# Patient Record
Sex: Female | Born: 1970 | Race: Black or African American | Hispanic: No | Marital: Married | Smoking: Never smoker
Health system: Southern US, Community
[De-identification: ages and names within clinical notes are randomized; demographics above are authoritative.]

## PROBLEM LIST (undated history)

## (undated) DIAGNOSIS — I720 Aneurysm of carotid artery: Secondary | ICD-10-CM

## (undated) DIAGNOSIS — E7801 Familial hypercholesterolemia: Secondary | ICD-10-CM

## (undated) DIAGNOSIS — R609 Edema, unspecified: Secondary | ICD-10-CM

## (undated) DIAGNOSIS — R519 Headache, unspecified: Secondary | ICD-10-CM

## (undated) DIAGNOSIS — R51 Headache: Secondary | ICD-10-CM

## (undated) DIAGNOSIS — Z9289 Personal history of other medical treatment: Secondary | ICD-10-CM

## (undated) DIAGNOSIS — D649 Anemia, unspecified: Secondary | ICD-10-CM

## (undated) DIAGNOSIS — I2510 Atherosclerotic heart disease of native coronary artery without angina pectoris: Secondary | ICD-10-CM

## (undated) DIAGNOSIS — Z973 Presence of spectacles and contact lenses: Secondary | ICD-10-CM

## (undated) DIAGNOSIS — R7303 Prediabetes: Secondary | ICD-10-CM

## (undated) DIAGNOSIS — I726 Aneurysm of vertebral artery: Secondary | ICD-10-CM

## (undated) DIAGNOSIS — I10 Essential (primary) hypertension: Secondary | ICD-10-CM

## (undated) DIAGNOSIS — G8929 Other chronic pain: Secondary | ICD-10-CM

## (undated) DIAGNOSIS — Z8249 Family history of ischemic heart disease and other diseases of the circulatory system: Secondary | ICD-10-CM

## (undated) DIAGNOSIS — Z90711 Acquired absence of uterus with remaining cervical stump: Secondary | ICD-10-CM

## (undated) HISTORY — DX: Aneurysm of carotid artery: I72.0

## (undated) HISTORY — DX: Edema, unspecified: R60.9

## (undated) HISTORY — DX: Headache: R51

## (undated) HISTORY — PX: ABDOMINAL HYSTERECTOMY: SHX81

## (undated) HISTORY — DX: Acquired absence of uterus with remaining cervical stump: Z90.711

## (undated) HISTORY — DX: Presence of spectacles and contact lenses: Z97.3

## (undated) HISTORY — DX: Aneurysm of vertebral artery: I72.6

## (undated) HISTORY — DX: Familial hypercholesterolemia: E78.01

## (undated) HISTORY — DX: Atherosclerotic heart disease of native coronary artery without angina pectoris: I25.10

## (undated) HISTORY — DX: Family history of ischemic heart disease and other diseases of the circulatory system: Z82.49

## (undated) HISTORY — DX: Prediabetes: R73.03

## (undated) HISTORY — PX: HERNIA REPAIR: SHX51

## (undated) HISTORY — DX: Other chronic pain: G89.29

## (undated) HISTORY — DX: Essential (primary) hypertension: I10

## (undated) HISTORY — DX: Headache, unspecified: R51.9

## (undated) HISTORY — PX: PARTIAL HYSTERECTOMY: SHX80

## (undated) HISTORY — DX: Personal history of other medical treatment: Z92.89

## (undated) HISTORY — PX: TUBAL LIGATION: SHX77

## (undated) HISTORY — DX: Anemia, unspecified: D64.9

---

## 1998-05-07 ENCOUNTER — Ambulatory Visit (HOSPITAL_COMMUNITY): Admission: RE | Admit: 1998-05-07 | Discharge: 1998-05-07 | Payer: Self-pay | Admitting: General Surgery

## 2000-01-22 ENCOUNTER — Encounter: Payer: Self-pay | Admitting: Family Medicine

## 2000-01-22 ENCOUNTER — Encounter: Admission: RE | Admit: 2000-01-22 | Discharge: 2000-01-22 | Payer: Self-pay | Admitting: Family Medicine

## 2001-02-03 ENCOUNTER — Encounter: Payer: Self-pay | Admitting: *Deleted

## 2001-02-03 ENCOUNTER — Emergency Department (HOSPITAL_COMMUNITY): Admission: EM | Admit: 2001-02-03 | Discharge: 2001-02-03 | Payer: Self-pay | Admitting: *Deleted

## 2001-03-22 DIAGNOSIS — Z9289 Personal history of other medical treatment: Secondary | ICD-10-CM

## 2001-03-22 HISTORY — DX: Personal history of other medical treatment: Z92.89

## 2001-11-06 ENCOUNTER — Emergency Department (HOSPITAL_COMMUNITY): Admission: EM | Admit: 2001-11-06 | Discharge: 2001-11-07 | Payer: Self-pay | Admitting: Emergency Medicine

## 2001-11-07 ENCOUNTER — Encounter: Payer: Self-pay | Admitting: *Deleted

## 2002-01-10 ENCOUNTER — Ambulatory Visit (HOSPITAL_COMMUNITY): Admission: RE | Admit: 2002-01-10 | Discharge: 2002-01-10 | Payer: Self-pay | Admitting: Gastroenterology

## 2002-01-10 ENCOUNTER — Encounter: Payer: Self-pay | Admitting: Gastroenterology

## 2002-01-10 ENCOUNTER — Encounter (INDEPENDENT_AMBULATORY_CARE_PROVIDER_SITE_OTHER): Payer: Self-pay | Admitting: *Deleted

## 2002-05-25 ENCOUNTER — Encounter: Admission: RE | Admit: 2002-05-25 | Discharge: 2002-05-25 | Payer: Self-pay | Admitting: Family Medicine

## 2002-05-25 ENCOUNTER — Encounter: Payer: Self-pay | Admitting: Family Medicine

## 2003-11-20 ENCOUNTER — Ambulatory Visit (HOSPITAL_COMMUNITY): Admission: RE | Admit: 2003-11-20 | Discharge: 2003-11-20 | Payer: Self-pay | Admitting: Family Medicine

## 2003-11-20 ENCOUNTER — Emergency Department (HOSPITAL_COMMUNITY): Admission: EM | Admit: 2003-11-20 | Discharge: 2003-11-20 | Payer: Self-pay | Admitting: Family Medicine

## 2003-11-29 ENCOUNTER — Ambulatory Visit (HOSPITAL_COMMUNITY): Admission: RE | Admit: 2003-11-29 | Discharge: 2003-11-29 | Payer: Self-pay | Admitting: Interventional Radiology

## 2003-11-29 ENCOUNTER — Encounter: Admission: RE | Admit: 2003-11-29 | Discharge: 2003-11-29 | Payer: Self-pay | Admitting: Family Medicine

## 2004-01-03 ENCOUNTER — Observation Stay (HOSPITAL_COMMUNITY): Admission: RE | Admit: 2004-01-03 | Discharge: 2004-01-03 | Payer: Self-pay | Admitting: Interventional Radiology

## 2004-01-10 ENCOUNTER — Emergency Department (HOSPITAL_COMMUNITY): Admission: EM | Admit: 2004-01-10 | Discharge: 2004-01-10 | Payer: Self-pay | Admitting: Family Medicine

## 2004-09-23 ENCOUNTER — Ambulatory Visit (HOSPITAL_COMMUNITY): Admission: RE | Admit: 2004-09-23 | Discharge: 2004-09-23 | Payer: Self-pay | Admitting: Anesthesiology

## 2004-12-09 ENCOUNTER — Emergency Department (HOSPITAL_COMMUNITY): Admission: EM | Admit: 2004-12-09 | Discharge: 2004-12-09 | Payer: Self-pay | Admitting: Emergency Medicine

## 2004-12-14 ENCOUNTER — Emergency Department (HOSPITAL_COMMUNITY): Admission: EM | Admit: 2004-12-14 | Discharge: 2004-12-15 | Payer: Self-pay | Admitting: Emergency Medicine

## 2005-01-17 ENCOUNTER — Ambulatory Visit (HOSPITAL_COMMUNITY): Admission: RE | Admit: 2005-01-17 | Discharge: 2005-01-17 | Payer: Self-pay | Admitting: *Deleted

## 2005-05-04 ENCOUNTER — Ambulatory Visit (HOSPITAL_COMMUNITY): Admission: RE | Admit: 2005-05-04 | Discharge: 2005-05-04 | Payer: Self-pay | Admitting: Family Medicine

## 2005-05-04 ENCOUNTER — Emergency Department (HOSPITAL_COMMUNITY): Admission: EM | Admit: 2005-05-04 | Discharge: 2005-05-04 | Payer: Self-pay | Admitting: Family Medicine

## 2005-11-09 ENCOUNTER — Emergency Department (HOSPITAL_COMMUNITY): Admission: EM | Admit: 2005-11-09 | Discharge: 2005-11-09 | Payer: Self-pay | Admitting: Family Medicine

## 2005-12-04 ENCOUNTER — Ambulatory Visit: Payer: Self-pay | Admitting: Internal Medicine

## 2005-12-04 ENCOUNTER — Observation Stay (HOSPITAL_COMMUNITY): Admission: EM | Admit: 2005-12-04 | Discharge: 2005-12-04 | Payer: Self-pay | Admitting: Family Medicine

## 2005-12-08 ENCOUNTER — Ambulatory Visit: Payer: Self-pay

## 2005-12-08 DIAGNOSIS — Z9289 Personal history of other medical treatment: Secondary | ICD-10-CM

## 2005-12-08 HISTORY — DX: Personal history of other medical treatment: Z92.89

## 2006-02-24 HISTORY — PX: OTHER SURGICAL HISTORY: SHX169

## 2006-11-22 ENCOUNTER — Emergency Department (HOSPITAL_COMMUNITY): Admission: EM | Admit: 2006-11-22 | Discharge: 2006-11-22 | Payer: Self-pay | Admitting: Emergency Medicine

## 2006-11-28 ENCOUNTER — Emergency Department (HOSPITAL_COMMUNITY): Admission: EM | Admit: 2006-11-28 | Discharge: 2006-11-28 | Payer: Self-pay | Admitting: Emergency Medicine

## 2007-05-19 ENCOUNTER — Ambulatory Visit (HOSPITAL_COMMUNITY): Admission: RE | Admit: 2007-05-19 | Discharge: 2007-05-19 | Payer: Self-pay | Admitting: Specialist

## 2007-09-07 ENCOUNTER — Emergency Department (HOSPITAL_COMMUNITY): Admission: EM | Admit: 2007-09-07 | Discharge: 2007-09-07 | Payer: Self-pay | Admitting: Family Medicine

## 2007-09-20 ENCOUNTER — Ambulatory Visit: Payer: Self-pay | Admitting: Family Medicine

## 2007-10-11 ENCOUNTER — Ambulatory Visit: Payer: Self-pay | Admitting: Family Medicine

## 2007-10-15 ENCOUNTER — Ambulatory Visit: Payer: Self-pay | Admitting: Family Medicine

## 2008-04-20 ENCOUNTER — Emergency Department (HOSPITAL_COMMUNITY): Admission: EM | Admit: 2008-04-20 | Discharge: 2008-04-20 | Payer: Self-pay | Admitting: Emergency Medicine

## 2008-04-21 ENCOUNTER — Ambulatory Visit: Payer: Self-pay | Admitting: Family Medicine

## 2009-02-19 ENCOUNTER — Ambulatory Visit: Payer: Self-pay | Admitting: Family Medicine

## 2009-02-22 ENCOUNTER — Ambulatory Visit: Payer: Self-pay | Admitting: Family Medicine

## 2009-02-24 HISTORY — PX: ESOPHAGOGASTRODUODENOSCOPY: SHX1529

## 2009-02-24 HISTORY — PX: COLONOSCOPY: SHX174

## 2009-03-16 ENCOUNTER — Ambulatory Visit: Payer: Self-pay | Admitting: Family Medicine

## 2009-03-27 ENCOUNTER — Ambulatory Visit: Payer: Self-pay | Admitting: Family Medicine

## 2009-03-27 ENCOUNTER — Other Ambulatory Visit: Admission: RE | Admit: 2009-03-27 | Discharge: 2009-03-27 | Payer: Self-pay | Admitting: Family Medicine

## 2009-03-28 LAB — HM PAP SMEAR: HM Pap smear: NEGATIVE

## 2009-05-07 ENCOUNTER — Emergency Department (HOSPITAL_COMMUNITY): Admission: EM | Admit: 2009-05-07 | Discharge: 2009-05-07 | Payer: Self-pay | Admitting: Emergency Medicine

## 2009-05-17 ENCOUNTER — Emergency Department (HOSPITAL_COMMUNITY): Admission: EM | Admit: 2009-05-17 | Discharge: 2009-05-17 | Payer: Self-pay | Admitting: Emergency Medicine

## 2009-06-12 ENCOUNTER — Ambulatory Visit: Payer: Self-pay | Admitting: Family Medicine

## 2009-07-22 ENCOUNTER — Emergency Department (HOSPITAL_COMMUNITY): Admission: EM | Admit: 2009-07-22 | Discharge: 2009-07-22 | Payer: Self-pay | Admitting: Emergency Medicine

## 2009-07-27 ENCOUNTER — Ambulatory Visit: Payer: Self-pay | Admitting: Family Medicine

## 2009-07-31 ENCOUNTER — Ambulatory Visit: Payer: Self-pay | Admitting: Family Medicine

## 2009-08-02 ENCOUNTER — Encounter (HOSPITAL_COMMUNITY): Admission: RE | Admit: 2009-08-02 | Discharge: 2009-08-23 | Payer: Self-pay | Admitting: Family Medicine

## 2009-11-29 ENCOUNTER — Ambulatory Visit: Payer: Self-pay | Admitting: Family Medicine

## 2009-12-02 ENCOUNTER — Emergency Department (HOSPITAL_COMMUNITY): Admission: EM | Admit: 2009-12-02 | Discharge: 2009-12-02 | Payer: Self-pay | Admitting: Emergency Medicine

## 2009-12-11 ENCOUNTER — Ambulatory Visit (HOSPITAL_COMMUNITY): Admission: RE | Admit: 2009-12-11 | Discharge: 2009-12-11 | Payer: Self-pay | Admitting: Interventional Radiology

## 2009-12-28 ENCOUNTER — Ambulatory Visit (HOSPITAL_COMMUNITY): Admission: RE | Admit: 2009-12-28 | Discharge: 2009-12-30 | Payer: Self-pay | Admitting: Obstetrics and Gynecology

## 2009-12-28 ENCOUNTER — Encounter (INDEPENDENT_AMBULATORY_CARE_PROVIDER_SITE_OTHER): Payer: Self-pay | Admitting: Obstetrics and Gynecology

## 2010-02-07 ENCOUNTER — Inpatient Hospital Stay (HOSPITAL_COMMUNITY)
Admission: RE | Admit: 2010-02-07 | Discharge: 2010-02-08 | Payer: Self-pay | Source: Home / Self Care | Attending: Interventional Radiology | Admitting: Interventional Radiology

## 2010-02-07 HISTORY — PX: ANEURYSM COILING: SHX5349

## 2010-02-13 ENCOUNTER — Ambulatory Visit: Payer: Self-pay | Admitting: Family Medicine

## 2010-02-26 ENCOUNTER — Ambulatory Visit (HOSPITAL_COMMUNITY): Admission: RE | Admit: 2010-02-26 | Payer: Self-pay | Source: Home / Self Care | Admitting: Interventional Radiology

## 2010-04-29 ENCOUNTER — Emergency Department (HOSPITAL_COMMUNITY): Payer: BC Managed Care – PPO

## 2010-04-29 ENCOUNTER — Emergency Department (HOSPITAL_COMMUNITY)
Admission: EM | Admit: 2010-04-29 | Discharge: 2010-04-29 | Disposition: A | Discharge status: Home / Self Care | Payer: BC Managed Care – PPO | Attending: Emergency Medicine | Admitting: Emergency Medicine

## 2010-04-29 DIAGNOSIS — R079 Chest pain, unspecified: Secondary | ICD-10-CM | POA: Insufficient documentation

## 2010-04-29 DIAGNOSIS — R110 Nausea: Secondary | ICD-10-CM | POA: Insufficient documentation

## 2010-04-29 DIAGNOSIS — E785 Hyperlipidemia, unspecified: Secondary | ICD-10-CM | POA: Insufficient documentation

## 2010-04-29 DIAGNOSIS — R51 Headache: Secondary | ICD-10-CM | POA: Insufficient documentation

## 2010-04-29 DIAGNOSIS — M542 Cervicalgia: Secondary | ICD-10-CM | POA: Insufficient documentation

## 2010-04-29 DIAGNOSIS — E7800 Pure hypercholesterolemia, unspecified: Secondary | ICD-10-CM | POA: Insufficient documentation

## 2010-04-29 LAB — DIFFERENTIAL
Basophils Absolute: 0.1 10*3/uL (ref 0.0–0.1)
Basophils Relative: 1 % (ref 0–1)
Eosinophils Absolute: 0.1 10*3/uL (ref 0.0–0.7)
Eosinophils Relative: 2 % (ref 0–5)
Lymphocytes Relative: 36 % (ref 12–46)
Lymphs Abs: 2.1 10*3/uL (ref 0.7–4.0)
Monocytes Absolute: 0.3 10*3/uL (ref 0.1–1.0)
Monocytes Relative: 5 % (ref 3–12)
Neutro Abs: 3.2 10*3/uL (ref 1.7–7.7)
Neutrophils Relative %: 56 % (ref 43–77)

## 2010-04-29 LAB — POCT I-STAT, CHEM 8
BUN: 8 mg/dL (ref 6–23)
Calcium, Ion: 1.14 mmol/L (ref 1.12–1.32)
Chloride: 106 mEq/L (ref 96–112)
Creatinine, Ser: 0.9 mg/dL (ref 0.4–1.2)
Glucose, Bld: 99 mg/dL (ref 70–99)
HCT: 40 % (ref 36.0–46.0)
Hemoglobin: 13.6 g/dL (ref 12.0–15.0)
Potassium: 3.7 mEq/L (ref 3.5–5.1)
Sodium: 139 mEq/L (ref 135–145)
TCO2: 23 mmol/L (ref 0–100)

## 2010-04-29 LAB — CBC
HCT: 38.4 % (ref 36.0–46.0)
Hemoglobin: 12.3 g/dL (ref 12.0–15.0)
MCH: 26 pg (ref 26.0–34.0)
MCHC: 32 g/dL (ref 30.0–36.0)
MCV: 81.2 fL (ref 78.0–100.0)
Platelets: 252 10*3/uL (ref 150–400)
RBC: 4.73 MIL/uL (ref 3.87–5.11)
RDW: 15.7 % — ABNORMAL HIGH (ref 11.5–15.5)
WBC: 5.8 10*3/uL (ref 4.0–10.5)

## 2010-04-29 LAB — POCT CARDIAC MARKERS
CKMB, poc: <1 ng/mL — ABNORMAL LOW (ref 1.0–8.0)
CKMB, poc: <1 ng/mL — ABNORMAL LOW (ref 1.0–8.0)
Myoglobin, poc: 46.6 ng/mL (ref 12–200)
Myoglobin, poc: 61.4 ng/mL (ref 12–200)
Troponin i, poc: <0.05 ng/mL (ref 0.00–0.09)
Troponin i, poc: <0.05 ng/mL (ref 0.00–0.09)

## 2010-04-29 LAB — PROTIME-INR
INR: 0.94 (ref 0.00–1.49)
Prothrombin Time: 12.8 seconds (ref 11.6–15.2)

## 2010-04-29 MED ORDER — GADOBENATE DIMEGLUMINE 529 MG/ML IV SOLN
15.0000 mL | Freq: Once | INTRAVENOUS | Status: AC
  Administered 2010-04-29: 15 mL via INTRAVENOUS

## 2010-05-03 NOTE — Consult Note (Signed)
NAME:  Debbie [name], Debbie [name]                 ACCOUNT NO.:  [id number]  MEDICAL RECORD NO.:  [id number]           PATIENT TYPE:  E  LOCATION:  [facility]                         FACILITY:  [facility]  PHYSICIAN:  Tia Hieronymus K. Orilla Templeman, M.D.DATE OF BIRTH:  04-01-1970  DATE OF CONSULTATION:  04/29/2010 DATE OF DISCHARGE:                                CONSULTATION   CHIEF COMPLAINT:  Headache.  BRIEF HISTORY:  We were asked to see this pleasant 40 year old female, known to Dr. Corliss Skains secondary to a history of cerebral aneurysms, who presented to the emergency department at [facility] today for further evaluation of headaches.  Initially, the patient had also reported some chest discomfort.  The patient has a history of a right internal carotid artery aneurysm that was treated with a stent-assisted coiling performed by Dr. Corliss Skains on February 07, 2010.  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  The patient presented to the emergency department today with a right- sided headache and chest pain.  A CT scan was negative for a bleed.  The patient stated that the headache started on Friday after a stressful episode at work.  The pain has waxed and waned since that time, but has not resolved completely.  She does have a history of migraine headaches; however, this pain is somewhat different.  The patient has taken Tylenol, but does not want narcotic pain medications.  She reports that her pain is a 6 on 01/10 scale at this time.  On Saturday, it was 8 on 1/10 scale.  She almost went to an urgent care center Saturday; however, the pain seemed to be improving and she decided not to seek treatment.  The patient also reports pain in the right side of her neck, the right mastoid area, and above her right eye.  This pain has been associated with muscle spasms of the neck on the right.  Recently, she has also had muscle spasms on  the left.  She has also had chest tightness at times. There have been no other associated symptoms, although the patient does report that she has had 3 episodes over the past 2 weeks of blurred/double vision.  Each episode lasted only several seconds, but these were very upsetting to the patient.  The patient reports that she has had headaches and chest tightness in the past associated with constipation.  She was constipated on Friday when the headaches began; however, she had several bowel movements over the weekend with no relief in her headache pain.  PAST MEDICAL HISTORY:  The patient has a history of anemia.  She has had previous uterine fibroids.  She had the above-noted endovascular treatment of her cerebral aneurysm.  She has hyperlipidemia and a history of dysphagia.  PAST SURGICAL HISTORY:  Significant for a hysterectomy, 2 hernia repairs, a C-section, tubal ligation, a tubal ligation reversal, and an esophageal dilatation.  ALLERGIES: 1. FLEXERIL. 2. PENICILLIN. 3. SULFA. 4. PAXIL.  CURRENT MEDICATIONS:  The patient is taking aspirin 325 mg daily for her history of stent placement.  SOCIAL HISTORY:  The patient is married.  She and her husband live in [place].  They have 2 children.  She is a nonsmoker.  She does not use alcohol.  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  FAMILY HISTORY:  The patient's mother is alive at age 67.  She has hypertension.  Her father died at age 23 from cardiovascular disease and diabetes.  She has 2 brothers and 1 sister who are alive and well.  LABORATORY DATA:  A head CT showed no intracranial hemorrhage and was essentially normal other than the fact that she had previously undergone stenting and coiling.  There is an MRI pending at this time.  There was an EKG on the chart, which did not show any obvious signs of ischemia, although there was a conduction delay.  A CBC revealed hemoglobin  12.3, hematocrit 38.4, WBCs 5.8 thousand, platelets 252,000.  A PT was within normal range.  Cardiac markers were negative.  An i-STAT 8 was within normal limits with a BUN of 8, creatinine 0.9.  PHYSICAL EXAMINATION:  NEUROLOGIC:  The patient had a normal neurological exam.  She was alert and oriented.  Cranial nerves II-XII were grossly intact.  Sensation was intact to light touch.  Motor strength was 5/5 throughout.  Cerebellar testing was intact. HEART:  Revealed regular rate and rhythm. LUNGS:  Clear.  The situation was discussed with Dr. Corliss Skains.  He was aware of the normal CT scan and of the pending MRI.  He felt that if the MRI was also normal, the patient could be discharged with followup in the near future with Dr. Corliss Skains.  She is currently due for her 57-month angiogram to be performed following her stent-assisted coiling.  We will contact her to have her angiogram performed within the next 1-2 weeks.  The patient might benefit from a muscle relaxant or some anxiety medication.  She does not want to take narcotic pain medications.  She might tolerate nonsteroidal anti-inflammatory drugs.  She needs to continue her aspirin 325 mg daily due to her stent placement.  Provided her MRI is normal, Dr. Corliss Skains recommended proceeding with discharge if the emergency department physicians were in agreement. Further followup will be within the next several weeks with a repeat cerebral angiogram.  The patient was told to contact us if there were any new changes.     Delton See, P.A.   ______________________________ Grandville Silos. Corliss Skains, M.D.    DR/MEDQ  D:  04/29/2010  T:  04/30/2010  Job:  [id number]  cc:   Sharlot Gowda, M.D.  Electronically Signed by Delton See P.A. on 04/30/2010 03:19:16 PM Electronically Signed by Julieanne Cotton M.D. on 05/02/2010 03:41:39 PM

## 2010-05-06 LAB — URINALYSIS, ROUTINE W REFLEX MICROSCOPIC
Bilirubin Urine: NEGATIVE
Glucose, UA: NEGATIVE mg/dL
Hgb urine dipstick: NEGATIVE
Ketones, ur: NEGATIVE mg/dL
Nitrite: NEGATIVE
Protein, ur: NEGATIVE mg/dL
Specific Gravity, Urine: 1.014 (ref 1.005–1.030)
Urobilinogen, UA: 0.2 mg/dL (ref 0.0–1.0)
pH: 7.5 (ref 5.0–8.0)

## 2010-05-06 LAB — DIFFERENTIAL
Basophils Absolute: 0 10*3/uL (ref 0.0–0.1)
Basophils Relative: 1 % (ref 0–1)
Eosinophils Absolute: 0.2 10*3/uL (ref 0.0–0.7)
Eosinophils Relative: 3 % (ref 0–5)
Lymphocytes Relative: 38 % (ref 12–46)
Lymphs Abs: 2.9 10*3/uL (ref 0.7–4.0)
Monocytes Absolute: 0.4 10*3/uL (ref 0.1–1.0)
Monocytes Relative: 6 % (ref 3–12)
Neutro Abs: 4 10*3/uL (ref 1.7–7.7)
Neutrophils Relative %: 53 % (ref 43–77)

## 2010-05-06 LAB — CBC
HCT: 31.7 % — ABNORMAL LOW (ref 36.0–46.0)
HCT: 32.6 % — ABNORMAL LOW (ref 36.0–46.0)
HCT: 39.5 % (ref 36.0–46.0)
Hemoglobin: 10.2 g/dL — ABNORMAL LOW (ref 12.0–15.0)
Hemoglobin: 9.8 g/dL — ABNORMAL LOW (ref 12.0–15.0)
Hemoglobin: 12.5 g/dL (ref 12.0–15.0)
MCH: 23.7 pg — ABNORMAL LOW (ref 26.0–34.0)
MCH: 23.9 pg — ABNORMAL LOW (ref 26.0–34.0)
MCH: 24.3 pg — ABNORMAL LOW (ref 26.0–34.0)
MCHC: 30.9 g/dL (ref 30.0–36.0)
MCHC: 31.3 g/dL (ref 30.0–36.0)
MCHC: 31.6 g/dL (ref 30.0–36.0)
MCV: 76.3 fL — ABNORMAL LOW (ref 78.0–100.0)
MCV: 76.8 fL — ABNORMAL LOW (ref 78.0–100.0)
MCV: 76.8 fL — ABNORMAL LOW (ref 78.0–100.0)
Platelets: 221 10*3/uL (ref 150–400)
Platelets: 242 10*3/uL (ref 150–400)
Platelets: 276 10*3/uL (ref 150–400)
RBC: 4.13 MIL/uL (ref 3.87–5.11)
RBC: 4.27 MIL/uL (ref 3.87–5.11)
RBC: 5.14 MIL/uL — ABNORMAL HIGH (ref 3.87–5.11)
RDW: 19 % — ABNORMAL HIGH (ref 11.5–15.5)
RDW: 19 % — ABNORMAL HIGH (ref 11.5–15.5)
RDW: 18.8 % — ABNORMAL HIGH (ref 11.5–15.5)
WBC: 10 10*3/uL (ref 4.0–10.5)
WBC: 11.7 10*3/uL — ABNORMAL HIGH (ref 4.0–10.5)
WBC: 7.6 10*3/uL (ref 4.0–10.5)

## 2010-05-06 LAB — BASIC METABOLIC PANEL
BUN: 3 mg/dL — ABNORMAL LOW (ref 6–23)
BUN: 7 mg/dL (ref 6–23)
CO2: 22 mEq/L (ref 19–32)
CO2: 23 mEq/L (ref 19–32)
Calcium: 8.2 mg/dL — ABNORMAL LOW (ref 8.4–10.5)
Calcium: 9.6 mg/dL (ref 8.4–10.5)
Chloride: 112 mEq/L (ref 96–112)
Chloride: 106 mEq/L (ref 96–112)
Creatinine, Ser: 0.66 mg/dL (ref 0.4–1.2)
Creatinine, Ser: 0.72 mg/dL (ref 0.4–1.2)
GFR calc Af Amer: >60 mL/min (ref >60)
GFR calc Af Amer: >60 mL/min (ref >60)
GFR calc non Af Amer: >60 mL/min (ref >60)
GFR calc non Af Amer: >60 mL/min (ref >60)
Glucose, Bld: 101 mg/dL — ABNORMAL HIGH (ref 70–99)
Glucose, Bld: 98 mg/dL (ref 70–99)
Potassium: 3.3 mEq/L — ABNORMAL LOW (ref 3.5–5.1)
Potassium: 4.2 mEq/L (ref 3.5–5.1)
Sodium: 139 mEq/L (ref 135–145)
Sodium: 136 mEq/L (ref 135–145)

## 2010-05-06 LAB — APTT
aPTT: 38 seconds — ABNORMAL HIGH (ref 24–37)
aPTT: 50 seconds — ABNORMAL HIGH (ref 24–37)
aPTT: 34 seconds (ref 24–37)

## 2010-05-06 LAB — HEPARIN LEVEL (UNFRACTIONATED): Heparin Unfractionated: <0.1 IU/mL — ABNORMAL LOW (ref 0.30–0.70)

## 2010-05-06 LAB — SURGICAL PCR SCREEN
MRSA, PCR: NEGATIVE
Staphylococcus aureus: NEGATIVE

## 2010-05-06 LAB — PROTIME-INR
INR: 1.06 (ref 0.00–1.49)
INR: 0.96 (ref 0.00–1.49)
Prothrombin Time: 14 seconds (ref 11.6–15.2)
Prothrombin Time: 13 seconds (ref 11.6–15.2)

## 2010-05-07 LAB — CBC
HCT: 20.2 % — ABNORMAL LOW (ref 36.0–46.0)
HCT: 23.1 % — ABNORMAL LOW (ref 36.0–46.0)
HCT: 27.1 % — ABNORMAL LOW (ref 36.0–46.0)
Hemoglobin: 6.4 g/dL — CL (ref 12.0–15.0)
Hemoglobin: 7.2 g/dL — ABNORMAL LOW (ref 12.0–15.0)
Hemoglobin: 8.8 g/dL — ABNORMAL LOW (ref 12.0–15.0)
MCH: 22.5 pg — ABNORMAL LOW (ref 26.0–34.0)
MCH: 22.6 pg — ABNORMAL LOW (ref 26.0–34.0)
MCH: 25 pg — ABNORMAL LOW (ref 26.0–34.0)
MCHC: 31.3 g/dL (ref 30.0–36.0)
MCHC: 31.5 g/dL (ref 30.0–36.0)
MCHC: 32.6 g/dL (ref 30.0–36.0)
MCV: 71.7 fL — ABNORMAL LOW (ref 78.0–100.0)
MCV: 72.1 fL — ABNORMAL LOW (ref 78.0–100.0)
MCV: 76.6 fL — ABNORMAL LOW (ref 78.0–100.0)
Platelets: 332 10*3/uL (ref 150–400)
Platelets: 336 10*3/uL (ref 150–400)
Platelets: 397 10*3/uL (ref 150–400)
RBC: 2.81 MIL/uL — ABNORMAL LOW (ref 3.87–5.11)
RBC: 3.2 MIL/uL — ABNORMAL LOW (ref 3.87–5.11)
RBC: 3.54 MIL/uL — ABNORMAL LOW (ref 3.87–5.11)
RDW: 17.7 % — ABNORMAL HIGH (ref 11.5–15.5)
RDW: 17.7 % — ABNORMAL HIGH (ref 11.5–15.5)
RDW: 20.7 % — ABNORMAL HIGH (ref 11.5–15.5)
WBC: 10.8 10*3/uL — ABNORMAL HIGH (ref 4.0–10.5)
WBC: 11.9 10*3/uL — ABNORMAL HIGH (ref 4.0–10.5)
WBC: 13.8 10*3/uL — ABNORMAL HIGH (ref 4.0–10.5)

## 2010-05-07 LAB — BASIC METABOLIC PANEL
BUN: 7 mg/dL (ref 6–23)
CO2: 25 mEq/L (ref 19–32)
Calcium: 8.5 mg/dL (ref 8.4–10.5)
Chloride: 107 mEq/L (ref 96–112)
Creatinine, Ser: 0.68 mg/dL (ref 0.4–1.2)
GFR calc Af Amer: >60 mL/min (ref >60)
GFR calc non Af Amer: >60 mL/min (ref >60)
Glucose, Bld: 92 mg/dL (ref 70–99)
Potassium: 3.7 mEq/L (ref 3.5–5.1)
Sodium: 139 mEq/L (ref 135–145)

## 2010-05-07 LAB — TYPE AND SCREEN
ABO/RH(D): A POS
Antibody Screen: NEGATIVE
Unit division: 0
Unit division: 0

## 2010-05-07 LAB — PREGNANCY, URINE: Preg Test, Ur: NEGATIVE

## 2010-05-07 LAB — ABO/RH: ABO/RH(D): A POS

## 2010-05-08 LAB — DIFFERENTIAL
Basophils Absolute: 0.1 10*3/uL (ref 0.0–0.1)
Basophils Relative: 1 % (ref 0–1)
Eosinophils Absolute: 0.1 10*3/uL (ref 0.0–0.7)
Eosinophils Relative: 2 % (ref 0–5)
Lymphocytes Relative: 37 % (ref 12–46)
Lymphs Abs: 2.4 10*3/uL (ref 0.7–4.0)
Monocytes Absolute: 0.5 10*3/uL (ref 0.1–1.0)
Monocytes Relative: 7 % (ref 3–12)
Neutro Abs: 3.5 10*3/uL (ref 1.7–7.7)
Neutrophils Relative %: 53 % (ref 43–77)

## 2010-05-08 LAB — CBC
HCT: 30.7 % — ABNORMAL LOW (ref 36.0–46.0)
HCT: 29.9 % — ABNORMAL LOW (ref 36.0–46.0)
Hemoglobin: 9.6 g/dL — ABNORMAL LOW (ref 12.0–15.0)
Hemoglobin: 8.7 g/dL — ABNORMAL LOW (ref 12.0–15.0)
MCH: 22.8 pg — ABNORMAL LOW (ref 26.0–34.0)
MCH: 21.2 pg — ABNORMAL LOW (ref 26.0–34.0)
MCHC: 31.3 g/dL (ref 30.0–36.0)
MCHC: 29.1 g/dL — ABNORMAL LOW (ref 30.0–36.0)
MCV: 72.6 fL — ABNORMAL LOW (ref 78.0–100.0)
MCV: 72.9 fL — ABNORMAL LOW (ref 78.0–100.0)
Platelets: 520 10*3/uL — ABNORMAL HIGH (ref 150–400)
Platelets: 476 10*3/uL — ABNORMAL HIGH (ref 150–400)
RBC: 4.23 MIL/uL (ref 3.87–5.11)
RBC: 4.1 MIL/uL (ref 3.87–5.11)
RDW: 18.4 % — ABNORMAL HIGH (ref 11.5–15.5)
RDW: 16.8 % — ABNORMAL HIGH (ref 11.5–15.5)
WBC: 6.5 10*3/uL (ref 4.0–10.5)
WBC: 6.6 10*3/uL (ref 4.0–10.5)

## 2010-05-08 LAB — URINALYSIS, ROUTINE W REFLEX MICROSCOPIC
Bilirubin Urine: NEGATIVE
Glucose, UA: NEGATIVE mg/dL
Hgb urine dipstick: NEGATIVE
Ketones, ur: NEGATIVE mg/dL
Leukocytes, UA: NEGATIVE
Nitrite: NEGATIVE
Protein, ur: 30 mg/dL — AB
Specific Gravity, Urine: 1.022 (ref 1.005–1.030)
Urobilinogen, UA: 0.2 mg/dL (ref 0.0–1.0)
pH: 7 (ref 5.0–8.0)

## 2010-05-08 LAB — URINE MICROSCOPIC-ADD ON

## 2010-05-08 LAB — POCT I-STAT, CHEM 8
BUN: 10 mg/dL (ref 6–23)
Calcium, Ion: 1.17 mmol/L (ref 1.12–1.32)
Chloride: 105 mEq/L (ref 96–112)
Creatinine, Ser: 1.1 mg/dL (ref 0.4–1.2)
Glucose, Bld: 99 mg/dL (ref 70–99)
HCT: 32 % — ABNORMAL LOW (ref 36.0–46.0)
Hemoglobin: 10.9 g/dL — ABNORMAL LOW (ref 12.0–15.0)
Potassium: 3.7 mEq/L (ref 3.5–5.1)
Sodium: 141 mEq/L (ref 135–145)
TCO2: 25 mmol/L (ref 0–100)

## 2010-05-08 LAB — BASIC METABOLIC PANEL
BUN: 6 mg/dL (ref 6–23)
CO2: 26 mEq/L (ref 19–32)
Calcium: 9.2 mg/dL (ref 8.4–10.5)
Chloride: 100 mEq/L (ref 96–112)
Creatinine, Ser: 0.64 mg/dL (ref 0.4–1.2)
GFR calc Af Amer: >60 mL/min (ref >60)
GFR calc non Af Amer: >60 mL/min (ref >60)
Glucose, Bld: 89 mg/dL (ref 70–99)
Potassium: 3.2 mEq/L — ABNORMAL LOW (ref 3.5–5.1)
Sodium: 134 mEq/L — ABNORMAL LOW (ref 135–145)

## 2010-05-08 LAB — SURGICAL PCR SCREEN
MRSA, PCR: NEGATIVE
Staphylococcus aureus: NEGATIVE

## 2010-05-08 LAB — POCT PREGNANCY, URINE: Preg Test, Ur: NEGATIVE

## 2010-05-13 LAB — RAPID URINE DRUG SCREEN, HOSP PERFORMED
Amphetamines: NOT DETECTED
Barbiturates: NOT DETECTED
Benzodiazepines: NOT DETECTED
Cocaine: NOT DETECTED
Opiates: NOT DETECTED
Tetrahydrocannabinol: NOT DETECTED

## 2010-05-13 LAB — CROSSMATCH
ABO/RH(D): A POS
Antibody Screen: NEGATIVE

## 2010-05-13 LAB — CK: Total CK: 75 U/L (ref 7–177)

## 2010-05-13 LAB — BASIC METABOLIC PANEL
BUN: 6 mg/dL (ref 6–23)
CO2: 24 mEq/L (ref 19–32)
Calcium: 9 mg/dL (ref 8.4–10.5)
Chloride: 109 mEq/L (ref 96–112)
Creatinine, Ser: 0.63 mg/dL (ref 0.4–1.2)
GFR calc Af Amer: >60 mL/min (ref >60)
GFR calc non Af Amer: >60 mL/min (ref >60)
Glucose, Bld: 96 mg/dL (ref 70–99)
Potassium: 3.5 mEq/L (ref 3.5–5.1)
Sodium: 138 mEq/L (ref 135–145)

## 2010-05-13 LAB — ABO/RH: ABO/RH(D): A POS

## 2010-06-09 ENCOUNTER — Inpatient Hospital Stay (INDEPENDENT_AMBULATORY_CARE_PROVIDER_SITE_OTHER)
Admission: RE | Admit: 2010-06-09 | Discharge: 2010-06-09 | Disposition: A | Discharge status: Home / Self Care | Payer: BC Managed Care – PPO | Source: Ambulatory Visit | Attending: Emergency Medicine | Admitting: Emergency Medicine

## 2010-06-09 DIAGNOSIS — L03039 Cellulitis of unspecified toe: Secondary | ICD-10-CM

## 2010-06-09 DIAGNOSIS — M722 Plantar fascial fibromatosis: Secondary | ICD-10-CM

## 2010-07-01 ENCOUNTER — Other Ambulatory Visit: Payer: Self-pay | Admitting: Podiatry

## 2010-07-12 NOTE — Consult Note (Signed)
NAME:  Debbie [name], Debbie [name]                 ACCOUNT NO.:  [id number]   MEDICAL RECORD NO.:  [id number]          PATIENT TYPE:  OBV   LOCATION:  4732                         FACILITY:  [facility]   PHYSICIAN:  Maple Mirza, PA   DATE OF BIRTH:  [birth year]   DATE OF CONSULTATION:  DATE OF DISCHARGE:  12/04/2005                                   CONSULTATION   She was seen by Dr. Gala Romney, who had formulated a plan after examining and  questioning the patient on October 11th.   ALLERGIES:  THIS PATIENT HAS ALLERGIES TO:  1. SULFA.  2. PENICILLIN.  3. FLEXERIL.   PRIMARY CARE PHYSICIAN:  Her primary caregiver is Dr. Loleta Chance and she has an  appointment with him on Saturday.   PRESENTING CIRCUMSTANCE:  I've been waiting all day for my test.   BRIEF HISTORY:  Ms. Debbie [name] is a 40 year old female with known dyslipidemia who  presents to the emergency room with chest pain that started October 9th.  Her chest pain was substernal chest pain, mid chest.  It did not radiate.  She had some nausea.  It was not worse with exertion.  It was first colicky  and intermittent but then became more protracted.  She called EMS on route  to [facility].  She was given nitroglycerin which reduced the pain  somewhat.  Here at the hospital, she has had a cardiac workup.  Her troponin-  I 70s are all less than 0.01 x4.  Her D-dimer was 1.24 in a setting of chest  pain but a computed tomogram of the chest was negative for pulmonary  embolism.  Her electrocardiogram has been surveyed and it is nondiagnostic  for ischemia.  She has sinus rhythm with no ST elevations.  Her complete  blood count showed a hemoglobin of 9.9 and mean cell volume which was below  normal limits, a microcytic anemia.  Her lipid panel was grossly elevated  with a cholesterol of 358, LDL cholesterol of 304, triglycerides of 60 and  HDL cholesterol of 42.   PAST MEDICAL HISTORY:  1. Dyslipidemia.  2. 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  3. Status post C section x2 with entrainment of the uvula during      intubation requiring steroids for reconfiguration.   MEDICATIONS ON ADMISSION:  None.   Today she has been started on Crestor 10 mg and baby aspirin.   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  She does not smoke, take alcoholic beverages or uses  drugs.  Once again she is status post C section x2.  She is married and has  2 children; 1 teenager, 1 pre-teenage.   FAMILY HISTORY:  Father had a heart attack in his 108s and is status post  coronary artery bypass graft surgery with redo coronary surgery.  Mother is  also living, has hypertension.  She has brothers and sisters who are alive  and well.   REVIEW OF SYSTEMS:  CONSTITUTIONAL:  The patient is not  complaining of  fevers, chills, night sweats, weight gain or loss, or adenopathy.  HEENT:  No epistaxis.  No hoarseness.  No vertigo.  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  It  was much more grossly enlarged after intubation but has been somewhat  reconfigured with steroids.  INTEGUMENT:  The patient has no rashes or non-  healing ulcerations.  CARDIOPULMONARY:  The patient had a history of  palpitation thought to be panic attacks under stress.  She was treated for  these with Paxil.  She has had none in the last 2 years.  Patient has chest  pain as related in history of present illness but no dyspnea on exertion.  No orthopnea.  No paroxysmal nocturnal dyspnea.  No lower extremity edema.  No history of syncope.  She says she gets dizzy when hungry or tired.  UROGENITAL:  No urinary problems.  No dysuria.  No frequency.  She has a  menstrual flow that is not heavy.  PSYCHIATRIC:  No anxiety or depression.  GASTROINTESTINAL:  Positive for dysphagia.  In the last few years, the food  feels as if it is stuck midway down.  In  fact, it is sticking around the  side of the chest pain on admission October 9th.  She does not have  regurgitation.  She had EGD with dilatation in the past but this was seen as  no stricture.  She also complains of refluxing and sometimes water brash.  She has no history of GI bleed or peptic ulcer disease.  NEUROLOGIC:  No  deficits.  MUSCULOSKELETAL:  No outstanding arthralgias.  No joint  deformities or effusions.   LABORATORY STUDIES THIS ADMISSION:  Complete blood count is hemoglobin is  9.9, hematocrit 30.2, white cells are 7, platelets are 393,000.   Serum electrolyte:  Sodium 137, potassium 3.6, chloride 106, carbonate 23,  BUN is 7, creatinine 0.7 and glucose is 82, alkaline phosphatase 51, SGOT  15, SGPT is 12, lipase is 27.   Lipids once again:  Cholesterol 358, triglycerides 60, HDL cholesterol 42,  LDL cholesterol 304.  Her D-dimer on admission was 1.24.   PHYSICAL EXAMINATION:  VITAL SIGNS:  Temperature is 97.5.  Blood pressure  130/78.  Pulse is 90.  Respirations are 18.  Oxygen saturation 97% on room  air.  GENERAL:  She is alert and oriented x3.  No acute distress.  Her chest pain  is resolved at the time of this examination.  HEENT:  Eyes:  Pupils are equal, round and reactive to light.  Extraocular  movements are intact.  Sclerae are anicteric.  Nares are patent.  NECK:  Supple.  No jugular venous distention.  No carotid bruits  auscultated.  LUNGS:  Clear to auscultation and percussion bilaterally.  HEART:  Regular rate and rhythm.  Telemetry sinus rhythm.  ABDOMEN:  Soft, nondistended.  Bowel sounds are present.  No  hepatosplenomegaly.  The abdominal aorta is non-pulsatile and non-extensile.  EXTREMITIES:  Show no evidence of edema, clubbing, or cyanosis.  The  dorsalis pedis pulses are 4/4 bilaterally.  Radial pulses are 4/4  bilaterally.  NEUROLOGIC:  She is grossly intact.   IMPRESSION: 1. Admitted with chest pain, 9/10 in severity, better with  nitroglycerin.      No radiation.  No exertional chest pain.  She does have nausea upon      admission.  (A)  Troponin-I studies, all are negative.  (  B)  Electrocardiogram nondiagnostic.  1. Hyperlipidemia, on no medications prior to admission.  2. Microcytic anemia.  Anemia panel has been drawn prior to her discharge.  3. History of dysphagia.  The patient says the food sticks at the same      place as her chest pain.  (A)  History of esophagogastroduodenoscopy dilatation of the esophagus 5  years ago, but report said no stricture.  (B)  Patient must eat very carefully to avoid food sticking in the esophagus  and she describes it about half way down.  1. History of palpation but none in the last 2 years.  2. Enlarged uvula entrained at intubation during C section.  (A)  Size lessened with steroids post C section.  (B)  Medical recommendation is for surgical reconfiguration since the uvula  is still enlarged.  1. A 3.5-mm secular aneurysm, left vertebral artery at the C (2) level.      Dr. Corliss Skains recommended follow up in 6 months.  This study was done      in November 2005 and no other studies have been done to date.   PLAN:  1. Stress test, outpatient, Monday, October 15th at 1 p.m.  We are going      to add a statin, a fairly high dose of Crestor 20 mg.  We are going to      add a proton-pump inhibitor, Protonix 40 mg daily.  Recommend in the      future at some time a modified barium swallow to rule out esophageal      dysmotility and this can be done as an outpatient.  Add aspirin.  Once      again, anemia panels have been drawn here at [facility] prior to      discharge.  Patient will have an iron supplement at discharge and      recommend highly for folic acid over-the-counter.  We also recommend      that she contact Dr. Corliss Skains to re-look into a 4 vessel cerebral      arteriogram as he suggested way back in November of 2005.  We will also      add Zetia to her statin  dose.  As mentioned above, Dr. Gala Romney who      has seen this patient, he doubts that chest pain is cardiac in origin,      possibly GI in origin, but given rather markedly high cholesterol      levels outpatient Myoview is recommended.  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  She was also started on      proton-pump inhibitor.  If pain recurs, GI evaluation to rule out      duodenal ulcer o peptic ulcer disease.   She will discharge on October 11th with the following medications:  1. Crestor 20 mg daily at bedtime.  2. Zetia 10 mg daily.  3. Protonix 40 mg daily.  4. Baby aspirin 81 mg daily.  5. Folic acid 400 mcg 2 tabs daily, over-the-counter.  6. Ferrous sulfate 325 mg, new.  7. Add Citrucel 1 tablespoon/8 ounces with juice or water also over-the-      counter to counteract the iron sulfate.           ______________________________  Maple Mirza, PA    GM/MEDQ  D:  12/04/2005  T:  12/05/2005  Job:  [id number]

## 2010-07-12 NOTE — H&P (Signed)
NAME:  Debbie [name], Debbie [name]                 ACCOUNT NO.:  [id number]   MEDICAL RECORD NO.:  [id number]          PATIENT TYPE:  OBV   LOCATION:  4732                         FACILITY:  [facility]   PHYSICIAN:  Hollice Espy, M.D.DATE OF BIRTH:  08-Aug-1970   DATE OF ADMISSION:  12/03/2005  DATE OF DISCHARGE:                                HISTORY & PHYSICAL   CHIEF COMPLAINT:  Chest pain.   HISTORY OF PRESENT ILLNESS:  The patient is a 40 year old African-American  female with a past medical history of hyperlipidemia, not on any medication,  who presents to the emergency room having chest pressure.  The patient  states the episode started yesterday located in the mid sternal area and  really not radiating, but causing significant nausea with it.  The pain is  described as a pressure, almost a 9 of 10/10 occurring intermittently and  then more frequently.  She finally could not take it any more, became  concerned and called paramedics.  She was given Nitroglycerin en route which  took her pain down from a 10 down to about a 6 or a 5.  She was evaluated in  the emergency room and she was found to have a normal EKG and normal cardiac  enzymes.  She had a slightly elevated d-dimer of 1.24, so she was sent for a  CT scan to rule out pulmonary embolus.  CT was negative for pulmonary  embolus and she is feeling better.  She is, however, noted to have elevated  blood pressure since she has gotten here ranging anywhere from 137 to  157/high 70s to low 80s.  So again cardiac enzymes and EKG were negative.   Currently the patient states she is feeling a little bit better.  She was  seen right before she went for a CT.  She denies any headaches, vision  changes, dysphagia.  She complains of some mild chest pressure, maybe a  2/10.  No shortness of breath.  She complains of some mild nausea.  No  abdominal pain.  No hematuria, dysuria, constipation, diarrhea, focal or  extremity numbness, weakness or pain.   Review of systems otherwise negative.   PAST MEDICAL HISTORY:  Hyperlipidemia.   LABORATORY DATA:  She clearly has a microcytic anemia.   ALLERGIES:  SHE HAS ALLERGIES TO SULFA, PENICILLIN AND FLEXERIL.   SOCIAL HISTORY:  She denies any tobacco, alcohol or drug use.   FAMILY HISTORY:  Noted for a dad who had an MI in his 13s.   PHYSICAL EXAMINATION:  VITALS ON ADMISSION:  Temperature 97.5, heart rate  72, blood pressure initially 157/82.  Since then it has come down to 137/76,  respirations 16.  O2 saturation 100% on 2 liters.  GENERAL:  The patient is alert and oriented x3 in no apparent distress.  HEENT:  Normocephalic, atraumatic.  Mucous membranes are moist.  She has no  carotid bruits.  HEART:  Regular rate and rhythm.  S1 and S2.  LUNGS:  Clear to auscultation bilaterally.  ABDOMEN:  Soft, nontender, nondistended.  Positive bowel sounds.   LABORATORY  DATA:  White count 7, H&H 9.9 and 30.2, MCV of 75, platelet count  393, no shift.  Sodium 137, potassium 3.6, chloride 106, bicarb 23, BUN 7,  creatinine 0.7, glucose 82.  LFT's are unremarkable.  Cardiac markers; first  set 67.5 MB, less than 0.1 troponin, now less than 0.05.  D-dimer is  elevated at 1.24, however, again CT angio was negative for pulmonary  embolus.   ASSESSMENT/PLAN:  1. Chest pain.  This may be musculoskeletal, but certainly do need to rule      out cardiac issue especially with her family history and history of      hyperlipidemia which is non-medication controlled.  We will check two      more sets of cardiac enzymes and a possible stress test in the morning.      We will also put her on a half inch of nitro paste.  We will ask      [facility] for a consult.  2. Abnormal CT findings.  I neglected to mention this on her CT, but she      does have some questionable two areas of possible benign hemangioma      spots seen on her liver.  They recommended CT of the abdomen which we      will check  tomorrow.  3. Hyperlipidemia.  Currently she says that she is diet controlled so we      will check a fasting lipid profile in the morning.  4. Elevated blood pressure.  While she is here put her on nitro paste.      She likely does need to establish with a PCP which will help her too.  5. Microcytic anemia.  Cause is indeterminate.  We will go ahead and check      iron studies.  She has no history of thalassemia.      Hollice Espy, M.D.  Electronically Signed     SKK/MEDQ  D:  12/03/2005  T:  12/04/2005  Job:  [id number]   cc:   [facility]

## 2010-09-09 ENCOUNTER — Other Ambulatory Visit (HOSPITAL_COMMUNITY): Payer: Self-pay | Admitting: Interventional Radiology

## 2010-09-09 DIAGNOSIS — I729 Aneurysm of unspecified site: Secondary | ICD-10-CM

## 2010-09-10 ENCOUNTER — Encounter: Payer: Self-pay | Admitting: Family Medicine

## 2010-09-10 ENCOUNTER — Ambulatory Visit (INDEPENDENT_AMBULATORY_CARE_PROVIDER_SITE_OTHER): Payer: BC Managed Care – PPO | Admitting: Family Medicine

## 2010-09-10 VITALS — BP 120/88 | HR 70 | Wt 154.0 lb

## 2010-09-10 DIAGNOSIS — E785 Hyperlipidemia, unspecified: Secondary | ICD-10-CM

## 2010-09-10 DIAGNOSIS — R51 Headache: Secondary | ICD-10-CM

## 2010-09-10 LAB — LIPID PANEL
Cholesterol: 427 mg/dL — ABNORMAL HIGH (ref 0–200)
HDL: 42 mg/dL (ref >39)
LDL Cholesterol: 365 mg/dL — ABNORMAL HIGH (ref 0–99)
Total CHOL/HDL Ratio: 10.2 Ratio
Triglycerides: 102 mg/dL (ref <150)
VLDL: 20 mg/dL (ref 0–40)

## 2010-09-10 MED ORDER — INDOMETHACIN 50 MG PO CAPS: 50.0000 mg | ORAL_CAPSULE | Freq: Three times a day (TID) | ORAL | Status: DC | PRN

## 2010-09-10 NOTE — Progress Notes (Signed)
  Subjective:    Patient ID: Debbie [name], female    DOB: 03-Oct-1970, 40 y.o.   MRN: [id number]  HPI She has a history of right internal carotid artery aneurysm with coil and stenting. After the procedure she was placed on Plavix and did have 2 episodes of visual disturbance. Since he stopped the Plavix and now only on aspirin she has not had anymore of these. She has been doing well until last week when she developed a right-sided headache that was precipitated a sudden flexion of her neck. It started in the right occipital area and proceeded to go to the right thigh. She has this headache all day but it does get worse when she gets up in the morning. She also complains of left-sided sharp headache for the last several days that is intermittent in nature. Neither of these headaches is associated with blurred vision, double vision, nausea or weakness.   Review of Systems     Objective:   Physical Exam Alert and in no distress. EOMI however convergence does show lack on the right       Assessment & Plan:  Headache, etiology unclear Her situation was discussed with the neurologist on-call. He recommended treating her with Indocin to see if this will help. He essentially indicated that stenting the long run was not necessarily a better way to go. I discussed the possibility refer her to neurology at a later date.

## 2010-09-10 NOTE — Patient Instructions (Signed)
Use the Indocin for the headache. If it helps, continue this and you can cancel the appointment with the radiologist. If not then followup with the radiologist

## 2010-09-11 ENCOUNTER — Other Ambulatory Visit: Payer: Self-pay

## 2010-09-11 ENCOUNTER — Ambulatory Visit (HOSPITAL_COMMUNITY): Payer: BC Managed Care – PPO

## 2010-09-11 ENCOUNTER — Institutional Professional Consult (permissible substitution): Payer: BC Managed Care – PPO | Admitting: Family Medicine

## 2010-09-11 ENCOUNTER — Telehealth: Payer: Self-pay

## 2010-09-11 MED ORDER — ROSUVASTATIN CALCIUM 20 MG PO TABS: 10.0000 mg | ORAL_TABLET | Freq: Every day | ORAL | Status: DC

## 2010-09-11 NOTE — Telephone Encounter (Signed)
Called pt asked about cholesterol med she had been off for over a month called new RX in to [address] pt is comeing to get savings card

## 2010-10-08 ENCOUNTER — Encounter: Payer: Self-pay | Admitting: Family Medicine

## 2010-10-09 ENCOUNTER — Ambulatory Visit (INDEPENDENT_AMBULATORY_CARE_PROVIDER_SITE_OTHER): Payer: BC Managed Care – PPO | Admitting: Family Medicine

## 2010-10-09 ENCOUNTER — Encounter: Payer: Self-pay | Admitting: Family Medicine

## 2010-10-09 VITALS — BP 120/72 | HR 92 | Wt 153.0 lb

## 2010-10-09 DIAGNOSIS — E785 Hyperlipidemia, unspecified: Secondary | ICD-10-CM

## 2010-10-09 DIAGNOSIS — R51 Headache: Secondary | ICD-10-CM

## 2010-10-09 MED ORDER — INDOMETHACIN 50 MG PO CAPS: 50.0000 mg | ORAL_CAPSULE | Freq: Three times a day (TID) | ORAL | Status: DC | PRN

## 2010-10-09 MED ORDER — EZETIMIBE 10 MG PO TABS: 10.0000 mg | ORAL_TABLET | Freq: Every day | ORAL | Status: DC

## 2010-10-09 MED ORDER — ROSUVASTATIN CALCIUM 20 MG PO TABS: 10.0000 mg | ORAL_TABLET | Freq: Every day | ORAL | Status: DC

## 2010-10-10 NOTE — Progress Notes (Signed)
  Subjective:    Patient ID: Debbie [name], female    DOB: February 27, 1970, 40 y.o.   MRN: [id number]  HPI She is here for recheck. Due to subcutaneous medication issues, she has not started on her cholesterol medication. She continues to have daily headaches however these seem to be controlled with the use of on Indocin every day. She would like a refill on this. Apparently the headaches are secondary to her underlying CNS issues. She was told that the headaches could last for longer time.   Review of Systems     Objective:   Physical Exam Alert and in no distress otherwise not examined       Assessment & Plan:  Hyperlipidemia. Chronic headaches. She will start on her Lipitor. I will also renew her Indocin.

## 2010-10-14 ENCOUNTER — Other Ambulatory Visit: Payer: Self-pay

## 2010-10-14 MED ORDER — POLYSACCHARIDE IRON COMPLEX 150 MG PO CAPS: 150.0000 mg | ORAL_CAPSULE | Freq: Two times a day (BID) | ORAL | Status: DC

## 2010-11-21 LAB — DIFFERENTIAL
Basophils Absolute: 0.1
Basophils Relative: 2 — ABNORMAL HIGH
Eosinophils Absolute: 0.1
Eosinophils Relative: 2
Lymphocytes Relative: 40
Lymphs Abs: 2.4
Monocytes Absolute: 0.4
Monocytes Relative: 7
Neutro Abs: 3
Neutrophils Relative %: 49

## 2010-11-21 LAB — CBC
HCT: 37.3
Hemoglobin: 12.4
MCHC: 33.2
MCV: 83
Platelets: 337
RBC: 4.49
RDW: 15.1
WBC: 6.1

## 2010-11-21 LAB — POCT I-STAT, CHEM 8
BUN: 7
Calcium, Ion: 1.23
Chloride: 107
Creatinine, Ser: 0.8
Glucose, Bld: 90
HCT: 40
Hemoglobin: 13.6
Potassium: 4.3
Sodium: 140
TCO2: 26

## 2011-04-22 ENCOUNTER — Encounter (HOSPITAL_COMMUNITY): Payer: Self-pay | Admitting: *Deleted

## 2011-04-22 ENCOUNTER — Other Ambulatory Visit: Payer: Self-pay

## 2011-04-22 ENCOUNTER — Observation Stay (HOSPITAL_COMMUNITY)
Admission: EM | Admit: 2011-04-22 | Discharge: 2011-04-24 | Disposition: A | Discharge status: Home / Self Care | Payer: BC Managed Care – PPO | Source: Ambulatory Visit | Attending: Internal Medicine | Admitting: Internal Medicine

## 2011-04-22 DIAGNOSIS — E785 Hyperlipidemia, unspecified: Secondary | ICD-10-CM | POA: Diagnosis present

## 2011-04-22 DIAGNOSIS — E7800 Pure hypercholesterolemia, unspecified: Secondary | ICD-10-CM | POA: Insufficient documentation

## 2011-04-22 DIAGNOSIS — R7989 Other specified abnormal findings of blood chemistry: Secondary | ICD-10-CM | POA: Diagnosis present

## 2011-04-22 DIAGNOSIS — R079 Chest pain, unspecified: Principal | ICD-10-CM | POA: Diagnosis present

## 2011-04-22 DIAGNOSIS — Z8249 Family history of ischemic heart disease and other diseases of the circulatory system: Secondary | ICD-10-CM | POA: Insufficient documentation

## 2011-04-22 DIAGNOSIS — R0602 Shortness of breath: Secondary | ICD-10-CM | POA: Insufficient documentation

## 2011-04-22 NOTE — ED Notes (Addendum)
C/o 5-6 month h/o intermittant scattered chest & upper back pain, some sob, some light headedness. Chest pain increases with movement and certain positions, also stress ("when something upsets me"), (denies: cough, congestion cold sx, fever or nvd). Alert,NAD, calm, interactive.

## 2011-04-22 NOTE — ED Notes (Signed)
EKG DONE BY EMT R Kailei Cowens 

## 2011-04-23 ENCOUNTER — Emergency Department (HOSPITAL_COMMUNITY): Payer: BC Managed Care – PPO

## 2011-04-23 ENCOUNTER — Observation Stay (HOSPITAL_COMMUNITY): Payer: BC Managed Care – PPO

## 2011-04-23 ENCOUNTER — Encounter (HOSPITAL_COMMUNITY): Payer: Self-pay | Admitting: Internal Medicine

## 2011-04-23 DIAGNOSIS — E785 Hyperlipidemia, unspecified: Secondary | ICD-10-CM | POA: Diagnosis present

## 2011-04-23 DIAGNOSIS — R079 Chest pain, unspecified: Secondary | ICD-10-CM | POA: Diagnosis present

## 2011-04-23 DIAGNOSIS — R7989 Other specified abnormal findings of blood chemistry: Secondary | ICD-10-CM | POA: Diagnosis present

## 2011-04-23 LAB — POCT I-STAT, CHEM 8
BUN: 12 mg/dL (ref 6–23)
Calcium, Ion: 1.27 mmol/L (ref 1.12–1.32)
Chloride: 107 mEq/L (ref 96–112)
Creatinine, Ser: 0.8 mg/dL (ref 0.50–1.10)
Glucose, Bld: 104 mg/dL — ABNORMAL HIGH (ref 70–99)
HCT: 40 % (ref 36.0–46.0)
Hemoglobin: 13.6 g/dL (ref 12.0–15.0)
Potassium: 3.8 mEq/L (ref 3.5–5.1)
Sodium: 143 mEq/L (ref 135–145)
TCO2: 24 mmol/L (ref 0–100)

## 2011-04-23 LAB — CBC
HCT: 38.7 % (ref 36.0–46.0)
Hemoglobin: 13.1 g/dL (ref 12.0–15.0)
MCH: 29.5 pg (ref 26.0–34.0)
MCHC: 33.9 g/dL (ref 30.0–36.0)
MCV: 87.2 fL (ref 78.0–100.0)
Platelets: 257 10*3/uL (ref 150–400)
RBC: 4.44 MIL/uL (ref 3.87–5.11)
RDW: 12.4 % (ref 11.5–15.5)
WBC: 8 10*3/uL (ref 4.0–10.5)

## 2011-04-23 LAB — CARDIAC PANEL(CRET KIN+CKTOT+MB+TROPI)
CK, MB: 1.6 ng/mL (ref 0.3–4.0)
CK, MB: 1.6 ng/mL (ref 0.3–4.0)
CK, MB: 1.4 ng/mL (ref 0.3–4.0)
Relative Index: INVALID (ref 0.0–2.5)
Relative Index: INVALID (ref 0.0–2.5)
Relative Index: INVALID (ref 0.0–2.5)
Total CK: 93 U/L (ref 7–177)
Total CK: 79 U/L (ref 7–177)
Total CK: 65 U/L (ref 7–177)
Troponin I: <0.3 ng/mL (ref <0.30)
Troponin I: <0.3 ng/mL (ref <0.30)
Troponin I: <0.3 ng/mL (ref <0.30)

## 2011-04-23 LAB — LIPID PANEL
Cholesterol: 200 mg/dL (ref 0–200)
HDL: 48 mg/dL (ref >39)
LDL Cholesterol: 141 mg/dL — ABNORMAL HIGH (ref 0–99)
Total CHOL/HDL Ratio: 4.2 RATIO
Triglycerides: 55 mg/dL (ref <150)
VLDL: 11 mg/dL (ref 0–40)

## 2011-04-23 LAB — D-DIMER, QUANTITATIVE: D-Dimer, Quant: 1.18 ug/mL-FEU — ABNORMAL HIGH (ref 0.00–0.48)

## 2011-04-23 LAB — POCT I-STAT TROPONIN I: Troponin i, poc: 0 ng/mL (ref 0.00–0.08)

## 2011-04-23 MED ORDER — ASPIRIN 81 MG PO CHEW
324.0000 mg | CHEWABLE_TABLET | Freq: Once | ORAL | Status: DC
  Filled 2011-04-23: qty 4

## 2011-04-23 MED ORDER — ONDANSETRON HCL 4 MG PO TABS: 4.0000 mg | ORAL_TABLET | Freq: Four times a day (QID) | ORAL | Status: DC | PRN

## 2011-04-23 MED ORDER — ONDANSETRON HCL 4 MG/2ML IJ SOLN
4.0000 mg | Freq: Once | INTRAMUSCULAR | Status: AC
  Administered 2011-04-23: 4 mg via INTRAVENOUS
  Filled 2011-04-23: qty 2

## 2011-04-23 MED ORDER — SODIUM CHLORIDE 0.45 % IV SOLN
INTRAVENOUS | Status: DC
  Administered 2011-04-23 – 2011-04-24 (×3): via INTRAVENOUS

## 2011-04-23 MED ORDER — MORPHINE SULFATE 2 MG/ML IJ SOLN: 2.0000 mg | INTRAMUSCULAR | Status: DC | PRN

## 2011-04-23 MED ORDER — EZETIMIBE 10 MG PO TABS
10.0000 mg | ORAL_TABLET | Freq: Every day | ORAL | Status: DC
  Administered 2011-04-23 – 2011-04-24 (×2): 10 mg via ORAL
  Filled 2011-04-23 (×3): qty 1

## 2011-04-23 MED ORDER — INDOMETHACIN 50 MG PO CAPS
50.0000 mg | ORAL_CAPSULE | Freq: Every day | ORAL | Status: DC
  Administered 2011-04-23 – 2011-04-24 (×2): 50 mg via ORAL
  Filled 2011-04-23 (×2): qty 1

## 2011-04-23 MED ORDER — ONDANSETRON HCL 4 MG/2ML IJ SOLN: 4.0000 mg | Freq: Four times a day (QID) | INTRAMUSCULAR | Status: DC | PRN

## 2011-04-23 MED ORDER — ACETAMINOPHEN 650 MG RE SUPP: 650.0000 mg | Freq: Four times a day (QID) | RECTAL | Status: DC | PRN

## 2011-04-23 MED ORDER — MORPHINE SULFATE 4 MG/ML IJ SOLN
4.0000 mg | Freq: Once | INTRAMUSCULAR | Status: AC
  Administered 2011-04-23: 4 mg via INTRAVENOUS
  Filled 2011-04-23: qty 1

## 2011-04-23 MED ORDER — NITROGLYCERIN 0.4 MG SL SUBL
0.4000 mg | SUBLINGUAL_TABLET | SUBLINGUAL | Status: DC | PRN
  Administered 2011-04-23: 0.4 mg via SUBLINGUAL
  Filled 2011-04-23: qty 25

## 2011-04-23 MED ORDER — ASPIRIN 325 MG PO TABS
325.0000 mg | ORAL_TABLET | Freq: Every day | ORAL | Status: DC
  Administered 2011-04-24: 325 mg via ORAL
  Filled 2011-04-23 (×2): qty 1

## 2011-04-23 MED ORDER — POLYSACCHARIDE IRON 150 MG PO CAPS
150.0000 mg | ORAL_CAPSULE | Freq: Two times a day (BID) | ORAL | Status: DC
  Administered 2011-04-23 – 2011-04-24 (×3): 150 mg via ORAL
  Filled 2011-04-23 (×5): qty 1

## 2011-04-23 MED ORDER — ACETAMINOPHEN 325 MG PO TABS: 650.0000 mg | ORAL_TABLET | Freq: Four times a day (QID) | ORAL | Status: DC | PRN

## 2011-04-23 MED ORDER — ENOXAPARIN SODIUM 80 MG/0.8ML ~~LOC~~ SOLN
70.0000 mg | Freq: Two times a day (BID) | SUBCUTANEOUS | Status: DC
  Administered 2011-04-23: 70 mg via SUBCUTANEOUS
  Filled 2011-04-23 (×3): qty 0.8

## 2011-04-23 MED ORDER — TECHNETIUM TO 99M ALBUMIN AGGREGATED
3.0000 | Freq: Once | INTRAVENOUS | Status: AC | PRN
  Administered 2011-04-23: 3 via INTRAVENOUS

## 2011-04-23 MED ORDER — ATORVASTATIN CALCIUM 10 MG PO TABS
10.0000 mg | ORAL_TABLET | Freq: Every day | ORAL | Status: DC
  Filled 2011-04-23: qty 1

## 2011-04-23 MED ORDER — ENOXAPARIN SODIUM 40 MG/0.4ML ~~LOC~~ SOLN
40.0000 mg | SUBCUTANEOUS | Status: DC
  Administered 2011-04-23: 40 mg via SUBCUTANEOUS
  Filled 2011-04-23 (×2): qty 0.4

## 2011-04-23 NOTE — ED Provider Notes (Signed)
History     CSN: [id number]  Arrival date & time 04/22/11  2243   First MD Initiated Contact with Patient 04/23/11 0142      Chief Complaint  Patient presents with  . Chest Pain  . Shortness of Breath  . Back Pain  . Dizziness    (Consider location/radiation/quality/duration/timing/severity/associated sxs/prior treatment) HPI Left-sided chest pains with shortness of breath. She does get intermittent chest pains, sharp in quality, admits to stress. Symptoms did not last long. Tonight having severe pain and symptoms now associated with difficulty breathing. No trauma. No recent illness. No cough or fevers. No associated leg pain or leg swelling. Patient anxious and does have increased pain with deep breath. No history of DVT or PE. No known cardiac history. Has had a stress test in the past. Has strong family history of coronary artery disease. Moderate in severity. No radiation of pain.   Past Medical History  Diagnosis Date  . Anemia   . Brain aneurysm     BASE OF SKULL  . Dyslipidemia   . Heart disease, unspecified     FAMILY HISTORY  . Hypercholesterolemia     Past Surgical History  Procedure Date  . Hernia repair   . Cesarean section   . Partial hysterectomy   . Aneurysm coiling   . Tubal ligation   . Tubal reversal     Family History  Problem Relation Age of Onset  . Heart failure Sister   . Heart failure Other   . Cancer Other     History  Substance Use Topics  . Smoking status: Never Smoker   . Smokeless tobacco: Never Used  . Alcohol Use: No    OB History    Grav Para Term Preterm Abortions TAB SAB Ect Mult Living                  Review of Systems  Constitutional: Negative for fever and chills.  HENT: Negative for neck pain and neck stiffness.   Eyes: Negative for pain.  Respiratory: Positive for shortness of breath. Negative for cough.   Cardiovascular: Positive for chest pain. Negative for palpitations and leg swelling.  Gastrointestinal:  Negative for vomiting and abdominal pain.  Genitourinary: Negative for dysuria.  Musculoskeletal: Negative for back pain.  Skin: Negative for rash.  Neurological: Negative for headaches.  All other systems reviewed and are negative.    Allergies  Flexeril; Penicillins; and Sulfa antibiotics  Home Medications   Current Outpatient Rx  Name Route Sig Dispense Refill  . ASPIRIN 325 MG PO TABS Oral Take 325 mg by mouth every morning.     Marland Kitchen EZETIMIBE 10 MG PO TABS Oral Take 10 mg by mouth every morning.    . INDOMETHACIN 50 MG PO CAPS Oral Take 50 mg by mouth every morning.    Marland Kitchen POLYSACCHARIDE IRON 150 MG PO CAPS Oral Take 150 mg by mouth 2 (two) times daily.    Marland Kitchen ROSUVASTATIN CALCIUM 20 MG PO TABS Oral Take 10 mg by mouth at bedtime.      BP 128/67  Pulse 89  Temp(Src) 98.8 F (37.1 C) (Oral)  Resp 16  SpO2 99%  Physical Exam  Constitutional: She is oriented to person, place, and time. She appears well-developed and well-nourished.  HENT:  Head: Normocephalic and atraumatic.  Eyes: Conjunctivae and EOM are normal. Pupils are equal, round, and reactive to light.  Neck: Trachea normal. Neck supple. No thyromegaly present.  Cardiovascular: Normal rate, regular rhythm,  S1 normal, S2 normal and normal pulses.     No systolic murmur is present   No diastolic murmur is present  Pulses:      Radial pulses are 2+ on the right side, and 2+ on the left side.  Pulmonary/Chest: Effort normal and breath sounds normal. She has no wheezes. She has no rhonchi. She has no rales. She exhibits no tenderness.       Localizes pain to left anterior chest wall without any reproducible tenderness. No crepitus or rash.  Abdominal: Soft. Normal appearance and bowel sounds are normal. There is no tenderness. There is no CVA tenderness and negative Murphy's sign.  Musculoskeletal:       BLE:s Calves nontender, no cords or erythema, negative Homans sign  Neurological: She is alert and oriented to person,  place, and time. She has normal strength. No cranial nerve deficit or sensory deficit. GCS eye subscore is 4. GCS verbal subscore is 5. GCS motor subscore is 6.  Skin: Skin is warm and dry. No rash noted. She is not diaphoretic.  Psychiatric: Her speech is normal.       Cooperative and appropriate    ED Course  Procedures (including critical care time)  Labs Reviewed  D-DIMER, QUANTITATIVE - Abnormal; Notable for the following:    D-Dimer, Quant 1.18 (*)    All other components within normal limits  POCT I-STAT, CHEM 8 - Abnormal; Notable for the following:    Glucose, Bld 104 (*)    All other components within normal limits  CBC  POCT I-STAT TROPONIN I   Dg Chest Portable 1 View  04/23/2011  *RADIOLOGY REPORT*  Clinical Data: Chest pain and shortness of breath.  PORTABLE CHEST - 1 VIEW  Comparison: 04/29/2010  Findings: Single view of the chest demonstrates clear lungs. Heart and mediastinum are within normal limits.  The trachea is midline. Bony structures are intact.  No evidence for a pneumothorax.  IMPRESSION: No acute chest findings.  Original Report Authenticated By: Richarda Overlie, M.D.     No diagnosis found.   Date: 04/23/2011  Rate: 87  Rhythm: normal sinus rhythm  QRS Axis: normal  Intervals: normal  ST/T Wave abnormalities: nonspecific ST changes  Conduction Disutrbances:none  Narrative Interpretation:   Old EKG Reviewed: none available  Aspirin. Nitroglycerin relieved chest pain. EKG reviewed as above. Patient has elevated d-dimer was sent for CTA and prior to exam patient recalls that she is allergic to IV contrast. Medicine consult for admission and VQ scan in the a.m. case discussed as above with Dr. Mikeal Hawthorne who agrees to admission  MDM   Chest pain elevated d-dimer. Plan admit and VQ scan in a.m. patient pain-free at 4:55 AM        Sunnie Nielsen, MD 04/23/11 [id number]

## 2011-04-23 NOTE — ED Notes (Signed)
Pt stated pain was  A 3/10. When attempting to give the pt another nitro she refused saying her pain was really all gone.

## 2011-04-23 NOTE — ED Notes (Signed)
Pt. Placed on monitor, SpO2, B/P cuff. Changed into a gown. Nurse at bedside.

## 2011-04-23 NOTE — ED Notes (Signed)
Here with c/o Mid sternal chest pain that radiates under L breast.constant pain since 1700.denies diaphoresis or nausea. Pt states she has this chest pain off and on for more than a year but more frequently the last 6 months.Marland Kitchen

## 2011-04-23 NOTE — ED Notes (Signed)
Pt states she had allergic reaction after receiving dye for CT before.  MD notified.

## 2011-04-23 NOTE — Progress Notes (Signed)
   CARE MANAGEMENT NOTE 04/23/2011  Patient:  WILLYE, JAVIER   Account Number:  [id number]  Date Initiated:  04/23/2011  Documentation initiated by:  Donn Pierini  Subjective/Objective Assessment:   Pt admitted with chest pain r/o PE     Action/Plan:   PTA pt lived at home with spouse was independent with ADLs- VQ scan to r/o PE   Anticipated DC Date:  04/24/2011   Anticipated DC Plan:  HOME/SELF CARE      DC Planning Services  CM consult      Choice offered to / List presented to:             Status of service:  In process, will continue to follow Medicare Important Message given?   (If response is "NO", the following Medicare IM given date fields will be blank) Date Medicare IM given:   Date Additional Medicare IM given:    Discharge Disposition:    Per UR Regulation:    Comments:  PCP- [place]  04/23/11- 1700- Donn Pierini RN, BSN [phone] Plan to d/c is VQ and dopplers negative, no anticipated d/c needs. CM to follow

## 2011-04-23 NOTE — Progress Notes (Signed)
Subjective: Denies any chest pain or shortness of breath today  Objective: Vital signs in last 24 hours: Filed Vitals:   04/23/11 0336 04/23/11 0503 04/23/11 0606 04/23/11 0643  BP: 148/84 128/67 146/84 135/81  Pulse: 78 89 79 88  Temp:    98.7 F (37.1 C)  TempSrc:    Oral  Resp: 20 16 18 16   Height:    5\' 4"  (1.626 m)  Weight:    68.493 kg (151 lb)  SpO2: 99% 99% 100% 100%    Intake/Output Summary (Last 24 hours) at 04/23/11 1936 Last data filed at 04/23/11 1902  Gross per 24 hour  Intake 1716.67 ml  Output    400 ml  Net 1316.67 ml    Weight change:  Constitutional: She is oriented to person, place, and time. She appears well-developed and well-nourished.  HENT:  Head: Normocephalic and atraumatic.  Right Ear: External ear normal.  Left Ear: External ear normal.  Nose: Nose normal.  Mouth/Throat: Oropharynx is clear and moist.  Eyes: Conjunctivae and EOM are normal. Pupils are equal, round, and reactive to light.  Neck: Normal range of motion. Neck supple.  Cardiovascular: Normal rate, regular rhythm, normal heart sounds and intact distal pulses.  Respiratory: Effort normal and breath sounds normal.  GI: Soft. Bowel sounds are normal.  Musculoskeletal: Normal range of motion.  Neurological: She is alert and oriented to person, place, and time. She has normal reflexes.  Skin: Skin is warm and dry.  Psychiatric: She has a normal mood and affect. Her behavior is normal. Judgment and thought content normal.     Lab Results: Results for orders placed during the hospital encounter of 04/22/11 (from the past 24 hour(s))  CBC     Status: Normal   Collection Time   04/23/11  1:59 AM      Component Value Range   WBC 8.0  4.0 - 10.5 (K/uL)   RBC 4.44  3.87 - 5.11 (MIL/uL)   Hemoglobin 13.1  12.0 - 15.0 (g/dL)   HCT 82.9  56.2 - 13.0 (%)   MCV 87.2  78.0 - 100.0 (fL)   MCH 29.5  26.0 - 34.0 (pg)   MCHC 33.9  30.0 - 36.0 (g/dL)   RDW 86.5  78.4 - 69.6 (%)   Platelets  257  150 - 400 (K/uL)  D-DIMER, QUANTITATIVE     Status: Abnormal   Collection Time   04/23/11  1:59 AM      Component Value Range   D-Dimer, Quant 1.18 (*) 0.00 - 0.48 (ug/mL-FEU)  POCT I-STAT TROPONIN I     Status: Normal   Collection Time   04/23/11  2:12 AM      Component Value Range   Troponin i, poc 0.00  0.00 - 0.08 (ng/mL)   Comment 3           POCT I-STAT, CHEM 8     Status: Abnormal   Collection Time   04/23/11  2:14 AM      Component Value Range   Sodium 143  135 - 145 (mEq/L)   Potassium 3.8  3.5 - 5.1 (mEq/L)   Chloride 107  96 - 112 (mEq/L)   BUN 12  6 - 23 (mg/dL)   Creatinine, Ser 2.95  0.50 - 1.10 (mg/dL)   Glucose, Bld 284 (*) 70 - 99 (mg/dL)   Calcium, Ion 1.32  4.40 - 1.32 (mmol/L)   TCO2 24  0 - 100 (mmol/L)   Hemoglobin 13.6  12.0 - 15.0 (g/dL)   HCT 16.1  09.6 - 04.5 (%)  CARDIAC PANEL(CRET KIN+CKTOT+MB+TROPI)     Status: Normal   Collection Time   04/23/11  8:50 AM      Component Value Range   Total CK 93  7 - 177 (U/L)   CK, MB 1.6  0.3 - 4.0 (ng/mL)   Troponin I <0.30  <0.30 (ng/mL)   Relative Index RELATIVE INDEX IS INVALID  0.0 - 2.5   LIPID PANEL     Status: Abnormal   Collection Time   04/23/11  8:58 AM      Component Value Range   Cholesterol 200  0 - 200 (mg/dL)   Triglycerides 55  <409 (mg/dL)   HDL 48  >81 (mg/dL)   Total CHOL/HDL Ratio 4.2     VLDL 11  0 - 40 (mg/dL)   LDL Cholesterol 191 (*) 0 - 99 (mg/dL)  CARDIAC PANEL(CRET KIN+CKTOT+MB+TROPI)     Status: Normal   Collection Time   04/23/11  2:45 PM      Component Value Range   Total CK 79  7 - 177 (U/L)   CK, MB 1.6  0.3 - 4.0 (ng/mL)   Troponin I <0.30  <0.30 (ng/mL)   Relative Index RELATIVE INDEX IS INVALID  0.0 - 2.5      Micro: No results found for this or any previous visit (from the past 240 hour(s)).  Studies/Results: Dg Chest 2 View  04/23/2011  *RADIOLOGY REPORT*  Clinical Data: Chest pain.  Question pneumonia.  CHEST - 2 VIEW  Comparison: 04/23/2011  Findings:  Heart and mediastinal contours are within normal limits. No focal opacities or effusions.  No acute bony abnormality.  IMPRESSION: Normal study.  Original Report Authenticated By: Cyndie Chime, M.D.   Nm Pulmonary Perfusion  04/23/2011  *RADIOLOGY REPORT*  Clinical Data:  Cough, short of breath.  NUCLEAR MEDICINE  PERFUSION LUNG SCAN  Technique:  Perfusion images were obtained in multiple projections after intravenous injection of Tc-73m MAA.  Radiopharmaceuticals:   3.0 mCi Tc-50m MAA.  Comparison: Chest x-ray 04/23/2011  Perfusion:  No wedge shaped peripheral perfusion defects to suggest acute pulmonary embolism  IMPRESSION:  Normal perfusion scan.  No evidence of pulmonary embolism.  Original Report Authenticated By: Genevive Bi, M.D.   Dg Chest Portable 1 View  04/23/2011  *RADIOLOGY REPORT*  Clinical Data: Chest pain and shortness of breath.  PORTABLE CHEST - 1 VIEW  Comparison: 04/29/2010  Findings: Single view of the chest demonstrates clear lungs. Heart and mediastinum are within normal limits.  The trachea is midline. Bony structures are intact.  No evidence for a pneumothorax.  IMPRESSION: No acute chest findings.  Original Report Authenticated By: Richarda Overlie, M.D.    Medications:  Scheduled Meds:   . aspirin  324 mg Oral Once  . aspirin  325 mg Oral Daily  . enoxaparin (LOVENOX) injection  70 mg Subcutaneous Q12H  . ezetimibe  10 mg Oral Daily  . indomethacin  50 mg Oral Daily  .  morphine injection  4 mg Intravenous Once  . ondansetron  4 mg Intravenous Once  . polysaccharide iron  150 mg Oral BID  . DISCONTD: atorvastatin  10 mg Oral q1800   Continuous Infusions:   . sodium chloride 100 mL/hr at 04/23/11 1724   PRN Meds:.acetaminophen, acetaminophen, morphine, nitroGLYCERIN, ondansetron (ZOFRAN) IV, ondansetron, technetium albumin aggregated   Assessment:   1. Atypical Chest pain: need to R/O PE and  MI. Will get a V/Q Scan and check cardiac enzymes serially. Empiric  full dose Lovenox until V/Q scan is done and if low probability, will DC that. Give baby ASA. 2-D Doppler is pending because of elevated d-dimer 2. Hyper lipidemia: Check Fasting Lipid panel  3. Back Pain: treat empirically.  4. Dizziness: cause unclear. Seems resolved now.  The DC home in the morning if Doppler is negative       LOS: 1 day   [facility] 04/23/2011, 7:36 PM

## 2011-04-23 NOTE — H&P (Signed)
Debbie [name] is an 41 y.o. female.   Chief Complaint: Chest Pain HPI: 41 yo with no significant past cardiac history but strong family history of cardiac disease here with atypical chest pain symptoms. The chest pain has been there for a while on and off but now persistent in the last day, no radiation, no diaphoresis. Has no NVD, mild SOB but no cough, no orthopnea, no PND. No aggravating factors but relieved by Nitro in the ED. Her initial workup was negative except for elevated D-dimer. Patient could not get Chest CT PE protocol due to Dye allergy.  Past Medical History  Diagnosis Date  . Anemia   . Brain aneurysm     BASE OF SKULL  . Dyslipidemia   . Heart disease, unspecified     FAMILY HISTORY  . Hypercholesterolemia     Past Surgical History  Procedure Date  . Hernia repair   . Cesarean section   . Partial hysterectomy   . Aneurysm coiling   . Tubal ligation   . Tubal reversal     Family History  Problem Relation Age of Onset  . Heart failure Sister   . Heart failure Other   . Cancer Other    Social History:  reports that she has never smoked. She has never used smokeless tobacco. She reports that she does not drink alcohol or use illicit drugs.  Allergies:  Allergies  Allergen Reactions  . Contrast Media (Iodinated Diagnostic Agents)     For CT Angio, breaks out in hives  . Flexeril (Cyclobenzaprine Hcl) Other (See Comments)    Muscle spasms  . Penicillins Other (See Comments)    Unknown reaction many yrs ago  . Sulfa Antibiotics Other (See Comments)    Unknown reaction    Medications Prior to Admission  Medication Dose Route Frequency Provider Last Rate Last Dose  . aspirin chewable tablet 324 mg  324 mg Oral Once Sunnie Nielsen, MD      . morphine 4 MG/ML injection 4 mg  4 mg Intravenous Once Sunnie Nielsen, MD   4 mg at 04/23/11 0224  . nitroGLYCERIN (NITROSTAT) SL tablet 0.4 mg  0.4 mg Sublingual Q5 min PRN Sunnie Nielsen, MD   0.4 mg at 04/23/11 0222  .  ondansetron (ZOFRAN) injection 4 mg  4 mg Intravenous Once Sunnie Nielsen, MD   4 mg at 04/23/11 0224   Medications Prior to Admission  Medication Sig Dispense Refill  . aspirin 325 MG tablet Take 325 mg by mouth every morning.       . ezetimibe (ZETIA) 10 MG tablet Take 10 mg by mouth every morning.      . rosuvastatin (CRESTOR) 20 MG tablet Take 10 mg by mouth at bedtime.        Results for orders placed during the hospital encounter of 04/22/11 (from the past 48 hour(s))  CBC     Status: Normal   Collection Time   04/23/11  1:59 AM      Component Value Range Comment   WBC 8.0  4.0 - 10.5 (K/uL)    RBC 4.44  3.87 - 5.11 (MIL/uL)    Hemoglobin 13.1  12.0 - 15.0 (g/dL)    HCT 16.1  09.6 - 04.5 (%)    MCV 87.2  78.0 - 100.0 (fL)    MCH 29.5  26.0 - 34.0 (pg)    MCHC 33.9  30.0 - 36.0 (g/dL)    RDW 40.9  81.1 - 91.4 (%)  Platelets 257  150 - 400 (K/uL)   D-DIMER, QUANTITATIVE     Status: Abnormal   Collection Time   04/23/11  1:59 AM      Component Value Range Comment   D-Dimer, Quant 1.18 (*) 0.00 - 0.48 (ug/mL-FEU)   POCT I-STAT TROPONIN I     Status: Normal   Collection Time   04/23/11  2:12 AM      Component Value Range Comment   Troponin i, poc 0.00  0.00 - 0.08 (ng/mL)    Comment 3            POCT I-STAT, CHEM 8     Status: Abnormal   Collection Time   04/23/11  2:14 AM      Component Value Range Comment   Sodium 143  135 - 145 (mEq/L)    Potassium 3.8  3.5 - 5.1 (mEq/L)    Chloride 107  96 - 112 (mEq/L)    BUN 12  6 - 23 (mg/dL)    Creatinine, Ser 1.61  0.50 - 1.10 (mg/dL)    Glucose, Bld 096 (*) 70 - 99 (mg/dL)    Calcium, Ion 0.45  1.12 - 1.32 (mmol/L)    TCO2 24  0 - 100 (mmol/L)    Hemoglobin 13.6  12.0 - 15.0 (g/dL)    HCT 40.9  81.1 - 91.4 (%)    Dg Chest Portable 1 View  04/23/2011  *RADIOLOGY REPORT*  Clinical Data: Chest pain and shortness of breath.  PORTABLE CHEST - 1 VIEW  Comparison: 04/29/2010  Findings: Single view of the chest demonstrates clear  lungs. Heart and mediastinum are within normal limits.  The trachea is midline. Bony structures are intact.  No evidence for a pneumothorax.  IMPRESSION: No acute chest findings.  Original Report Authenticated By: Richarda Overlie, M.D.    Review of Systems  Respiratory: Positive for shortness of breath.   Cardiovascular: Positive for chest pain.  All other systems reviewed and are negative.    Blood pressure 146/84, pulse 79, temperature 98.8 F (37.1 C), temperature source Oral, resp. rate 18, SpO2 100.00%. Physical Exam  Constitutional: She is oriented to person, place, and time. She appears well-developed and well-nourished.  HENT:  Head: Normocephalic and atraumatic.  Right Ear: External ear normal.  Left Ear: External ear normal.  Nose: Nose normal.  Mouth/Throat: Oropharynx is clear and moist.  Eyes: Conjunctivae and EOM are normal. Pupils are equal, round, and reactive to light.  Neck: Normal range of motion. Neck supple.  Cardiovascular: Normal rate, regular rhythm, normal heart sounds and intact distal pulses.   Respiratory: Effort normal and breath sounds normal.  GI: Soft. Bowel sounds are normal.  Musculoskeletal: Normal range of motion.  Neurological: She is alert and oriented to person, place, and time. She has normal reflexes.  Skin: Skin is warm and dry.  Psychiatric: She has a normal mood and affect. Her behavior is normal. Judgment and thought content normal.     Assessment/Plan 1. Atypical Chest pain: need to R/O PE and MI. Will get a V/Q Scan and check cardiac enzymes serially. Empiric full dose Lovenox until V/Q scan is done and if low probability, will DC that. Give baby ASA. 2. Hyper lipidemia: Check Fasting Lipid panel 3. Back Pain: treat empirically. 4. Dizziness: cause unclear. Seems resolved now.  Salahuddin Arismendez,LAWAL 04/23/2011, 6:24 AM

## 2011-04-23 NOTE — Progress Notes (Signed)
Utilization review complete 

## 2011-04-23 NOTE — Progress Notes (Signed)
Patient arrived to unit from ED. Alert and oriented. Patient stable. Patient denies having any pain at this time. Vitals and weight taken. Tele monitor on patient. Patient oriented to unit and room. Orders reviewed. Safety video on. Will continue to monitor.

## 2011-04-23 NOTE — Progress Notes (Signed)
ANTICOAGULATION CONSULT NOTE - Initial Consult  Pharmacy Consult for Lovenox Indication: R/O PE  Allergies  Allergen Reactions  . Contrast Media (Iodinated Diagnostic Agents)     For CT Angio, breaks out in hives  . Flexeril (Cyclobenzaprine Hcl) Other (See Comments)    Muscle spasms  . Penicillins Other (See Comments)    Unknown reaction many yrs ago  . Sulfa Antibiotics Other (See Comments)    Unknown reaction    Patient Measurements: Height: 5\' 4"  (162.6 cm) Weight: 151 lb (68.493 kg) (scale C) IBW/kg (Calculated) : 54.7   Vital Signs: Temp: 98.7 F (37.1 C) (02/27 0643) Temp src: Oral (02/27 0643) BP: 135/81 mmHg (02/27 0643) Pulse Rate: 88  (02/27 0643)  Labs:  Basename 04/23/11 0214 04/23/11 0159  HGB 13.6 13.1  HCT 40.0 38.7  PLT -- 257  APTT -- --  LABPROT -- --  INR -- --  HEPARINUNFRC -- --  CREATININE 0.80 --  CKTOTAL -- --  CKMB -- --  TROPONINI -- --   Estimated Creatinine Clearance: 86.2 ml/min (by C-G formula based on Cr of 0.8).  Medical History: Past Medical History  Diagnosis Date  . Anemia   . Brain aneurysm     BASE OF SKULL  . Dyslipidemia   . Heart disease, unspecified     FAMILY HISTORY  . Hypercholesterolemia     Medications:  Prescriptions prior to admission  Medication Sig Dispense Refill  . aspirin 325 MG tablet Take 325 mg by mouth every morning.       . ezetimibe (ZETIA) 10 MG tablet Take 10 mg by mouth every morning.      . indomethacin (INDOCIN) 50 MG capsule Take 50 mg by mouth every morning.      . polysaccharide iron (NIFEREX) 150 MG CAPS capsule Take 150 mg by mouth 2 (two) times daily.      . rosuvastatin (CRESTOR) 20 MG tablet Take 10 mg by mouth at bedtime.        Assessment: 41 yo female with chest pain, poss PE, for Lovenox  Goal of Therapy:  Full anticoagulation with Lovenox   Plan:  Lovenox 70 mg SQ q12h F/U VQ scan results  Rossy Virag, Gary Fleet 04/23/2011,7:19 AM

## 2011-04-24 ENCOUNTER — Ambulatory Visit (HOSPITAL_COMMUNITY): Admission: RE | Admit: 2011-04-24 | Payer: BC Managed Care – PPO | Source: Ambulatory Visit

## 2011-04-24 LAB — COMPREHENSIVE METABOLIC PANEL
ALT: 13 U/L (ref 0–35)
AST: 22 U/L (ref 0–37)
Albumin: 3.2 g/dL — ABNORMAL LOW (ref 3.5–5.2)
Alkaline Phosphatase: 39 U/L (ref 39–117)
BUN: 9 mg/dL (ref 6–23)
CO2: 25 mEq/L (ref 19–32)
Calcium: 8.7 mg/dL (ref 8.4–10.5)
Chloride: 105 mEq/L (ref 96–112)
Creatinine, Ser: 0.7 mg/dL (ref 0.50–1.10)
GFR calc Af Amer: >90 mL/min (ref >90)
GFR calc non Af Amer: >90 mL/min (ref >90)
Glucose, Bld: 100 mg/dL — ABNORMAL HIGH (ref 70–99)
Potassium: 4.3 mEq/L (ref 3.5–5.1)
Sodium: 136 mEq/L (ref 135–145)
Total Bilirubin: 0.7 mg/dL (ref 0.3–1.2)
Total Protein: 6.7 g/dL (ref 6.0–8.3)

## 2011-04-24 LAB — CBC
HCT: 39.3 % (ref 36.0–46.0)
Hemoglobin: 12.9 g/dL (ref 12.0–15.0)
MCH: 29 pg (ref 26.0–34.0)
MCHC: 32.8 g/dL (ref 30.0–36.0)
MCV: 88.3 fL (ref 78.0–100.0)
Platelets: 243 10*3/uL (ref 150–400)
RBC: 4.45 MIL/uL (ref 3.87–5.11)
RDW: 12.5 % (ref 11.5–15.5)
WBC: 6.9 10*3/uL (ref 4.0–10.5)

## 2011-04-24 NOTE — Progress Notes (Signed)
IV d/c'd.  Tele d/c'd.  Pt d/c'd to home.  Home meds and d/c instructions have been discussed with pt.  Pt denies any questions or concerns at this time.  Pt leaving unit via wheelchair and appears in no acute distress. Dustina Scoggin RN 

## 2011-04-24 NOTE — Progress Notes (Deleted)
VASCULAR LAB PRELIMINARY  PRELIMINARY  PRELIMINARY  PRELIMINARY  Bilateral lower extremity venous duplex  completed.    Preliminary report:  Bilateral:  No evidence of DVT, superficial thrombosis, or Baker's Cyst.   Terance Hart, RVT 04/24/2011, 11:53 AM

## 2011-04-24 NOTE — Discharge Summary (Signed)
Physician Discharge Summary  Debbie [name] MRN: [id number] DOB/AGE: August 12, 1970 41 y.o.  PCP: Carollee Herter, MD, MD   Admit date: 04/22/2011 Discharge date: 04/24/2011  Discharge Diagnoses:     *Chest pain    Hyperlipidemia  Elevated d-dimer with negative workup   Medication List  As of 04/24/2011  2:11 PM   TAKE these medications         aspirin 325 MG tablet   Take 325 mg by mouth every morning.      ezetimibe 10 MG tablet   Commonly known as: ZETIA   Take 10 mg by mouth every morning.      indomethacin 50 MG capsule   Commonly known as: INDOCIN   Take 50 mg by mouth every morning.      polysaccharide iron 150 MG Caps capsule   Commonly known as: NIFEREX   Take 150 mg by mouth 2 (two) times daily.      rosuvastatin 20 MG tablet   Commonly known as: CRESTOR   Take 10 mg by mouth at bedtime.            Discharge Condition: Stable Disposition: 01-Home or Self Care   Consults: None   Significant Diagnostic Studies: Dg Chest 2 View  04/23/2011  *RADIOLOGY REPORT*  Clinical Data: Chest pain.  Question pneumonia.  CHEST - 2 VIEW  Comparison: 04/23/2011  Findings: Heart and mediastinal contours are within normal limits. No focal opacities or effusions.  No acute bony abnormality.  IMPRESSION: Normal study.  Original Report Authenticated By: Cyndie Chime, M.D.   Nm Pulmonary Perfusion  04/23/2011  *RADIOLOGY REPORT*  Clinical Data:  Cough, short of breath.  NUCLEAR MEDICINE  PERFUSION LUNG SCAN  Technique:  Perfusion images were obtained in multiple projections after intravenous injection of Tc-23m MAA.  Radiopharmaceuticals:   3.0 mCi Tc-59m MAA.  Comparison: Chest x-ray 04/23/2011  Perfusion:  No wedge shaped peripheral perfusion defects to suggest acute pulmonary embolism  IMPRESSION:  Normal perfusion scan.  No evidence of pulmonary embolism.  Original Report Authenticated By: Genevive Bi, M.D.   Dg Chest Portable 1 View  04/23/2011  *RADIOLOGY  REPORT*  Clinical Data: Chest pain and shortness of breath.  PORTABLE CHEST - 1 VIEW  Comparison: 04/29/2010  Findings: Single view of the chest demonstrates clear lungs. Heart and mediastinum are within normal limits.  The trachea is midline. Bony structures are intact.  No evidence for a pneumothorax.  IMPRESSION: No acute chest findings.  Original Report Authenticated By: Richarda Overlie, M.D.      Microbiology: No results found for this or any previous visit (from the past 240 hour(s)).   Labs: Results for orders placed during the hospital encounter of 04/22/11 (from the past 48 hour(s))  CBC     Status: Normal   Collection Time   04/23/11  1:59 AM      Component Value Range Comment   WBC 8.0  4.0 - 10.5 (K/uL)    RBC 4.44  3.87 - 5.11 (MIL/uL)    Hemoglobin 13.1  12.0 - 15.0 (g/dL)    HCT 40.9  81.1 - 91.4 (%)    MCV 87.2  78.0 - 100.0 (fL)    MCH 29.5  26.0 - 34.0 (pg)    MCHC 33.9  30.0 - 36.0 (g/dL)    RDW 78.2  95.6 - 21.3 (%)    Platelets 257  150 - 400 (K/uL)   D-DIMER, QUANTITATIVE     Status: Abnormal  Collection Time   04/23/11  1:59 AM      Component Value Range Comment   D-Dimer, Quant 1.18 (*) 0.00 - 0.48 (ug/mL-FEU)   POCT I-STAT TROPONIN I     Status: Normal   Collection Time   04/23/11  2:12 AM      Component Value Range Comment   Troponin i, poc 0.00  0.00 - 0.08 (ng/mL)    Comment 3            POCT I-STAT, CHEM 8     Status: Abnormal   Collection Time   04/23/11  2:14 AM      Component Value Range Comment   Sodium 143  135 - 145 (mEq/L)    Potassium 3.8  3.5 - 5.1 (mEq/L)    Chloride 107  96 - 112 (mEq/L)    BUN 12  6 - 23 (mg/dL)    Creatinine, Ser 1.61  0.50 - 1.10 (mg/dL)    Glucose, Bld 096 (*) 70 - 99 (mg/dL)    Calcium, Ion 0.45  1.12 - 1.32 (mmol/L)    TCO2 24  0 - 100 (mmol/L)    Hemoglobin 13.6  12.0 - 15.0 (g/dL)    HCT 40.9  81.1 - 91.4 (%)   CARDIAC PANEL(CRET KIN+CKTOT+MB+TROPI)     Status: Normal   Collection Time   04/23/11  8:50 AM       Component Value Range Comment   Total CK 93  7 - 177 (U/L)    CK, MB 1.6  0.3 - 4.0 (ng/mL)    Troponin I <0.30  <0.30 (ng/mL)    Relative Index RELATIVE INDEX IS INVALID  0.0 - 2.5    LIPID PANEL     Status: Abnormal   Collection Time   04/23/11  8:58 AM      Component Value Range Comment   Cholesterol 200  0 - 200 (mg/dL)    Triglycerides 55  <782 (mg/dL)    HDL 48  >95 (mg/dL)    Total CHOL/HDL Ratio 4.2      VLDL 11  0 - 40 (mg/dL)    LDL Cholesterol 621 (*) 0 - 99 (mg/dL)   CARDIAC PANEL(CRET KIN+CKTOT+MB+TROPI)     Status: Normal   Collection Time   04/23/11  2:45 PM      Component Value Range Comment   Total CK 79  7 - 177 (U/L)    CK, MB 1.6  0.3 - 4.0 (ng/mL)    Troponin I <0.30  <0.30 (ng/mL)    Relative Index RELATIVE INDEX IS INVALID  0.0 - 2.5    CARDIAC PANEL(CRET KIN+CKTOT+MB+TROPI)     Status: Normal   Collection Time   04/23/11 10:58 PM      Component Value Range Comment   Total CK 65  7 - 177 (U/L)    CK, MB 1.4  0.3 - 4.0 (ng/mL)    Troponin I <0.30  <0.30 (ng/mL)    Relative Index RELATIVE INDEX IS INVALID  0.0 - 2.5    CBC     Status: Normal   Collection Time   04/24/11  6:15 AM      Component Value Range Comment   WBC 6.9  4.0 - 10.5 (K/uL)    RBC 4.45  3.87 - 5.11 (MIL/uL)    Hemoglobin 12.9  12.0 - 15.0 (g/dL)    HCT 30.8  65.7 - 84.6 (%)    MCV 88.3  78.0 - 100.0 (fL)  MCH 29.0  26.0 - 34.0 (pg)    MCHC 32.8  30.0 - 36.0 (g/dL)    RDW 03.4  74.2 - 59.5 (%)    Platelets 243  150 - 400 (K/uL)   COMPREHENSIVE METABOLIC PANEL     Status: Abnormal   Collection Time   04/24/11  6:15 AM      Component Value Range Comment   Sodium 136  135 - 145 (mEq/L)    Potassium 4.3  3.5 - 5.1 (mEq/L) HEMOLYSIS AT THIS LEVEL MAY AFFECT RESULT   Chloride 105  96 - 112 (mEq/L)    CO2 25  19 - 32 (mEq/L)    Glucose, Bld 100 (*) 70 - 99 (mg/dL)    BUN 9  6 - 23 (mg/dL)    Creatinine, Ser 6.38  0.50 - 1.10 (mg/dL)    Calcium 8.7  8.4 - 10.5 (mg/dL)    Total Protein  6.7  6.0 - 8.3 (g/dL)    Albumin 3.2 (*) 3.5 - 5.2 (g/dL)    AST 22  0 - 37 (U/L) HEMOLYSIS AT THIS LEVEL MAY AFFECT RESULT   ALT 13  0 - 35 (U/L) HEMOLYSIS AT THIS LEVEL MAY AFFECT RESULT   Alkaline Phosphatase 39  39 - 117 (U/L) HEMOLYSIS AT THIS LEVEL MAY AFFECT RESULT   Total Bilirubin 0.7  0.3 - 1.2 (mg/dL)    GFR calc non Af Amer >90  >90 (mL/min)    GFR calc Af Amer >90  >90 (mL/min)      HPI : 41 yo with no significant past cardiac history but strong family history of cardiac disease here with atypical chest pain symptoms. The chest pain has been there for a while on and off but now persistent in the last day, no radiation, no diaphoresis. Has no NVD, mild SOB but no cough, no orthopnea, no PND. No aggravating factors but relieved by Nitro in the ED. Her initial workup was negative except for elevated D-dimer. Patient could not get Chest CT PE protocol due to Dye allergy.  HOSPITAL COURSE:  #1 chest pain cardiac enzymes were negative V/Q scan that was also negative The patient has been advised  to get an outpatient stress test #2 elevated d-dimer V/Q scan and Doppler of the bilateral lower extremities was negative   Discharge Exam:  Blood pressure 117/74, pulse 68, temperature 98.1 F (36.7 C), temperature source Oral, resp. rate 19, height 5\' 4"  (1.626 m), weight 69.899 kg (154 lb 1.6 oz), SpO2 100.00%.  Constitutional: She is oriented to person, place, and time. She appears well-developed and well-nourished.  HENT:  Head: Normocephalic and atraumatic.  Right Ear: External ear normal.  Left Ear: External ear normal.  Nose: Nose normal.  Mouth/Throat: Oropharynx is clear and moist.  Eyes: Conjunctivae and EOM are normal. Pupils are equal, round, and reactive to light.  Neck: Normal range of motion. Neck supple.  Cardiovascular: Normal rate, regular rhythm, normal heart sounds and intact distal pulses.  Respiratory: Effort normal and breath sounds normal.  GI: Soft. Bowel  sounds are normal.  Musculoskeletal: Normal range of motion.  Neurological: She is alert and oriented to person, place, and time. She has normal reflexes.  Skin: Skin is warm and dry.  Psychiatric: She has a normal mood and affect. Her behavior is normal. Judgment and thought content normal.     Discharge Orders    Future Orders Please Complete By Expires   Diet - low sodium heart healthy  Increase activity slowly         Follow-up Information    Follow up with Carollee Herter, MD .         Signed: Richarda Overlie 04/24/2011, 2:11 PM

## 2011-04-24 NOTE — Progress Notes (Signed)
VASCULAR LAB PRELIMINARY  PRELIMINARY  PRELIMINARY  PRELIMINARY  Bilateral lower extremity venous duplex  completed.    Preliminary report:  Bilateral:  No evidence of DVT, superficial thrombosis, or Baker's Cyst.    Terance Hart, RVT 04/24/2011, 10:33 AM

## 2011-04-24 NOTE — Progress Notes (Signed)
Patient 's PCP is Sharlot Gowda [phone]

## 2011-05-01 ENCOUNTER — Ambulatory Visit (INDEPENDENT_AMBULATORY_CARE_PROVIDER_SITE_OTHER): Payer: BC Managed Care – PPO | Admitting: Family Medicine

## 2011-05-01 ENCOUNTER — Encounter: Payer: Self-pay | Admitting: Family Medicine

## 2011-05-01 VITALS — BP 130/96 | HR 80 | Wt 155.0 lb

## 2011-05-01 DIAGNOSIS — R0789 Other chest pain: Secondary | ICD-10-CM

## 2011-05-01 MED ORDER — INDOMETHACIN 50 MG PO CAPS: 50.0000 mg | ORAL_CAPSULE | Freq: Two times a day (BID) | ORAL | Status: DC

## 2011-05-01 NOTE — Progress Notes (Signed)
  Subjective:    Patient ID: Debbie [name], female    DOB: 02/06/1971, 41 y.o.   MRN: [id number]  HPI She is here for a consult. She was recently seen in the emergency room and admitted for evaluation of chest pain and possible PE. The evaluation was negative. The emergency room record and hospital record was reviewed. She now complains of pain mainly in the left chest area that is worse with taking a deep breath and also laying on her left side. No cough, congestion, sore throat, fever or chills. She does use Indocin to help with her headache that she has had since she had the CNS stent placed.   Review of Systems     Objective:   Physical Exam alert and in no distress. Tympanic membranes and canals are normal. Throat is clear. Tonsils are normal. Neck is supple without adenopathy or thyromegaly. Cardiac exam shows a regular sinus rhythm without murmurs or gallops. Lungs are clear to auscultation. No chest wall tenderness noted. Full motion of the left shoulder without pain.        Assessment & Plan:   1. Musculoskeletal chest pain  indomethacin (INDOCIN) 50 MG capsule   the hospital record was reviewed and they adequately ruled out any major concerns. She is to use the Indocin regularly for the next 7-10 days. Continued difficulty, further evaluation will be necessary.

## 2011-06-03 ENCOUNTER — Ambulatory Visit (INDEPENDENT_AMBULATORY_CARE_PROVIDER_SITE_OTHER): Payer: BC Managed Care – PPO | Admitting: Medical

## 2011-06-03 ENCOUNTER — Encounter: Payer: Self-pay | Admitting: Medical

## 2011-06-03 VITALS — BP 120/70 | HR 76 | Temp 98.4°F | Resp 16 | Ht 65.0 in | Wt 156.0 lb

## 2011-06-03 DIAGNOSIS — I729 Aneurysm of unspecified site: Secondary | ICD-10-CM

## 2011-06-03 DIAGNOSIS — R519 Headache, unspecified: Secondary | ICD-10-CM

## 2011-06-03 DIAGNOSIS — R51 Headache: Secondary | ICD-10-CM

## 2011-06-03 DIAGNOSIS — B351 Tinea unguium: Secondary | ICD-10-CM

## 2011-06-03 DIAGNOSIS — E785 Hyperlipidemia, unspecified: Secondary | ICD-10-CM | POA: Insufficient documentation

## 2011-06-03 DIAGNOSIS — Z0000 Encounter for general adult medical examination without abnormal findings: Secondary | ICD-10-CM

## 2011-06-03 DIAGNOSIS — G8929 Other chronic pain: Secondary | ICD-10-CM

## 2011-06-03 DIAGNOSIS — D649 Anemia, unspecified: Secondary | ICD-10-CM

## 2011-06-03 LAB — POCT URINALYSIS DIPSTICK
Bilirubin, UA: NEGATIVE
Glucose, UA: NEGATIVE
Ketones, UA: NEGATIVE
Leukocytes, UA: NEGATIVE
Nitrite, UA: NEGATIVE
Spec Grav, UA: <=1.005
Urobilinogen, UA: NEGATIVE
pH, UA: 7

## 2011-06-03 MED ORDER — TERBINAFINE HCL 250 MG PO TABS: 250.0000 mg | ORAL_TABLET | Freq: Every day | ORAL | Status: DC

## 2011-06-03 NOTE — Progress Notes (Addendum)
Subjective:   HPI  Debbie [name] is a 41 y.o. female who presents for a complete physical.  She has forms that need completed for foster care as she is foster parent.  She, her husband, and children all have appointments here today for physicals.  She has hx/o aneurysm, one was coiled, and still has another one that is not progressed at this point. She sees interventional radiologist periodically to recheck with arteriogram.  She has never seen neurosurgery, neurology , vascular surgery.  She does report hx/o cardiac evaluation 2004, and was recently seen in the ED for chest pain at [facility].  Preventative care: Last mammogram late 30s Last pap smear 04/2010, but hx/o partial hysterectomy for uterine fibroids, still has ovaries Last ophthalmology about a year ago Last dental visit about 2 years ago Last Tdap? Gets flu shot sometimes  Reviewed their medical, surgical, family, social, medication, and allergy history and updated chart as appropriate.  Past Medical History  Diagnosis Date  . Anemia   . Family history of heart disease in female family member before age 64     LDL over 300 prior  . 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 History of blood transfusion     x2 due to heavy bleeding and anemia from fibroids  . Edema   . Wears glasses   . Chronic headache     since aneurysm diagnosis  . S/P partial hysterectomy     due to uterine fibroids  . Aneurysm of carotid artery     hx/o - coiling 02/07/2010  . Familial hypercholesteremia     LDL over 300; lipoprotein testing 01/2009  . Vitamin d deficiency   . H/O echocardiogram 03/22/01    mild asymmetric LVH, mild aortic sclerosis, mild mitral regurgitation  . H/O cardiovascular stress test 12/08/05    fair exercise capacity, no ST segment changes suggestive of ischemia   Past Surgical History  Procedure Date  . Cesarean section   . Partial hysterectomy   . Tubal  ligation   . Tubal reversal   . Hernia repair     epigastric hernia repair age 49, right inguinal hernia as a child  . Esophagogastroduodenoscopy 2011    Dr. Loreta Ave  . Colonoscopy 2011    Dr. Loreta Ave, anemia workup  . Aneurysm coiling 02/07/10    stent assisted coiling of right internal carotid artery aneurysm in periophthalmic region     Family History  Problem Relation Age of Onset  . Heart failure Sister   . Heart disease Sister 44    stents  . Hypertension Sister   . Heart failure Other   . Cancer Other   . Hypertension Mother   . Obesity Mother   . Heart disease Father 12    hx/o CABG, stening, died of MI at age 43yo  . Diabetes Father   . Hyperlipidemia Father   . Cancer Maternal Grandmother     breast  . Stroke Maternal Grandfather   . Cancer Paternal Grandfather     prostate cancer    History   Social History  . Marital Status: Married    Spouse Name: N/A    Number of Children: N/A  . Years of Education: N/A   Occupational History  . TEACHER, [employer] high school    Social History Main Topics  . Smoking status: Never Smoker   . Smokeless tobacco: Never Used  . Alcohol Use: No  .  Drug Use: No  . Sexually Active: Not on file   Other Topics Concern  . Not on file   Social History Narrative   Married, 4 children, exercise with trainer in 12/2010, walking some    Current Outpatient Prescriptions on File Prior to Visit  Medication Sig Dispense Refill  . aspirin 325 MG tablet Take 325 mg by mouth every morning.       . ezetimibe (ZETIA) 10 MG tablet Take 10 mg by mouth every morning.      . indomethacin (INDOCIN) 50 MG capsule Take 1 capsule (50 mg total) by mouth 2 (two) times daily with a meal.  60 capsule  11  . polysaccharide iron (NIFEREX) 150 MG CAPS capsule Take 150 mg by mouth 2 (two) times daily.      . rosuvastatin (CRESTOR) 20 MG tablet Take 10 mg by mouth at bedtime.        Allergies  Allergen Reactions  . Contrast Media (Iodinated  Diagnostic Agents)     For CT Angio, breaks out in hives  . Flexeril (Cyclobenzaprine Hcl) Other (See Comments)    Muscle spasms  . Lipitor (Atorvastatin Calcium) Other (See Comments)    Pt states she had a bad reaction several years ago when taking this  . Penicillins Other (See Comments)    Unknown reaction many yrs ago  . Sulfa Antibiotics Other (See Comments)    Unknown reaction   Review of Systems Constitutional: -fever, -chills, -sweats, -unexpected weight change, -anorexia, -fatigue Allergy: -sneezing, -itching, -congestion Dermatology: denies changing moles, rash, lumps, new worrisome lesions ENT: -runny nose, -ear pain, +sore throat, -hoarseness, -sinus pain, -teeth pain, -tinnitus, -hearing loss, -epistaxis Cardiology:  -chest pain, -palpitations, -edema, -orthopnea, -paroxysmal nocturnal dyspnea Respiratory: -cough, -shortness of breath, -dyspnea on exertion, -wheezing, -hemoptysis Gastroenterology: -abdominal pain, -nausea, -vomiting, -diarrhea, -constipation, -blood in stool, -changes in bowel movement, -dysphagia Hematology: -bleeding or bruising problems Musculoskeletal: -arthralgias, -myalgias, -joint swelling, -back pain, -neck pain, -cramping, -gait changes Ophthalmology: -vision changes, -eye redness, -itching, -discharge Urology: -dysuria, -difficulty urinating, -hematuria, -urinary frequency, -urgency, incontinence Neurology: +headache, -weakness, -tingling, -numbness, -speech abnormality, -memory loss, -falls, -dizziness Psychology:  -depressed mood, -agitation, -sleep problems      Objective:   Physical Exam  Filed Vitals:   06/03/11 1454  BP: 120/70  Pulse: 76  Temp: 98.4 F (36.9 C)  Resp: 16    General appearance: alert, no distress, WD/WN, black female, pleasant Skin: no worrisome lesions, few toenails with yellowish coloration, right great toenail thickened and yellow HEENT: normocephalic, conjunctiva/corneas normal, sclerae anicteric, PERRLA,  EOMi, nares patent, no discharge or erythema, pharynx normal Oral cavity: MMM, tongue normal, teeth in good repair Neck: supple, no lymphadenopathy, no thyromegaly, no masses, normal ROM, no bruits Chest: non tender, normal shape and expansion Heart: RRR, normal S1, S2, no murmurs Lungs: CTA bilaterally, no wheezes, rhonchi, or rales Abdomen: +bs, soft, upper mid abdomen surgical scar, non tender, non distended, no masses, no hepatomegaly, no splenomegaly, no bruits Back: non tender, normal ROM, no scoliosis Musculoskeletal: upper extremities non tender, no obvious deformity, normal ROM throughout, lower extremities non tender, no obvious deformity, normal ROM throughout Extremities: no edema, no cyanosis, no clubbing Pulses: 2+ symmetric, upper and lower extremities, normal cap refill Neurological: alert, oriented x 3, CN2-12 intact, strength normal upper extremities and lower extremities, sensation normal throughout, DTRs 2+ throughout, no cerebellar signs, gait normal Psychiatric: normal affect, behavior normal, pleasant  Breast/gyn deferred today at pt request.   Assessment and  Plan :    Encounter Diagnoses  Name Primary?  . Routine general medical examination at a health care facility Yes  . Onychomycosis   . Anemia   . Dyslipidemia   . Aneurysm   . Chronic headache     Physical exam - discussed healthy lifestyle, diet, exercise, preventative care, vaccinations, and addressed their concerns.  Handout given.  Onychomycosis - begin Lamisil.  She has used this prior.  recheck 6wk  Anemia - recent CBC 04/24/11 from ED visit normal.  We will get iron studies though since she is current on iron therapy long term.  Dyslipidemia - quite high LDLs in the past.  Will await other labs and consider modification of therapy, goal LDL <100.  Aneurysm - will review prior chart records, and will review case with Dr. Susann Givens  Chronic headache - currently controlled on indocin   Follow-up  pending labs.  Return 6wk for breast and pelvic exam, recheck on Lamisil and scheduling for screening mammogram

## 2011-06-04 ENCOUNTER — Telehealth (HOSPITAL_COMMUNITY): Payer: Self-pay

## 2011-06-04 ENCOUNTER — Encounter: Payer: Self-pay | Admitting: Medical

## 2011-06-04 LAB — IBC PANEL
%SAT: 14 % — ABNORMAL LOW (ref 20–55)
TIBC: 372 ug/dL (ref 250–470)
UIBC: 320 ug/dL (ref 125–400)

## 2011-06-04 LAB — FERRITIN: Ferritin: 58 ng/mL (ref 10–291)

## 2011-06-04 LAB — TSH: TSH: 0.893 u[IU]/mL (ref 0.350–4.500)

## 2011-06-04 LAB — IRON: Iron: 52 ug/dL (ref 42–145)

## 2011-06-04 NOTE — Telephone Encounter (Signed)
Left a message for the pt to call in order to schedule a F/U consult with Dr. Corliss Skains  06-04-11 11:10 JM

## 2011-06-09 ENCOUNTER — Other Ambulatory Visit: Payer: Self-pay | Admitting: Medical

## 2011-06-09 MED ORDER — NIACIN ER (ANTIHYPERLIPIDEMIC) 500 MG PO TBCR: 500.0000 mg | EXTENDED_RELEASE_TABLET | Freq: Every day | ORAL | Status: DC

## 2011-06-10 ENCOUNTER — Telehealth (HOSPITAL_COMMUNITY): Payer: Self-pay

## 2011-06-10 NOTE — Telephone Encounter (Signed)
Left a vm for the pt to give Dr. Fatima Sanger office a call

## 2011-10-12 ENCOUNTER — Other Ambulatory Visit: Payer: Self-pay | Admitting: Family Medicine

## 2011-12-16 ENCOUNTER — Ambulatory Visit (INDEPENDENT_AMBULATORY_CARE_PROVIDER_SITE_OTHER): Payer: BC Managed Care – PPO | Admitting: Family Medicine

## 2011-12-16 ENCOUNTER — Encounter: Payer: Self-pay | Admitting: Family Medicine

## 2011-12-16 VITALS — BP 136/88 | HR 70 | Wt 154.0 lb

## 2011-12-16 DIAGNOSIS — D649 Anemia, unspecified: Secondary | ICD-10-CM

## 2011-12-16 DIAGNOSIS — H6120 Impacted cerumen, unspecified ear: Secondary | ICD-10-CM

## 2011-12-16 DIAGNOSIS — R42 Dizziness and giddiness: Secondary | ICD-10-CM

## 2011-12-16 LAB — CBC WITH DIFFERENTIAL/PLATELET
Basophils Absolute: 0 10*3/uL (ref 0.0–0.1)
Basophils Relative: 1 % (ref 0–1)
Eosinophils Absolute: 0.2 10*3/uL (ref 0.0–0.7)
Eosinophils Relative: 3 % (ref 0–5)
HCT: 38.6 % (ref 36.0–46.0)
Hemoglobin: 12.6 g/dL (ref 12.0–15.0)
Lymphocytes Relative: 43 % (ref 12–46)
Lymphs Abs: 2.8 10*3/uL (ref 0.7–4.0)
MCH: 29.5 pg (ref 26.0–34.0)
MCHC: 32.6 g/dL (ref 30.0–36.0)
MCV: 90.4 fL (ref 78.0–100.0)
Monocytes Absolute: 0.4 10*3/uL (ref 0.1–1.0)
Monocytes Relative: 6 % (ref 3–12)
Neutro Abs: 3 10*3/uL (ref 1.7–7.7)
Neutrophils Relative %: 47 % (ref 43–77)
Platelets: 261 10*3/uL (ref 150–400)
RBC: 4.27 MIL/uL (ref 3.87–5.11)
RDW: 13.1 % (ref 11.5–15.5)
WBC: 6.3 10*3/uL (ref 4.0–10.5)

## 2011-12-16 MED ORDER — MECLIZINE HCL 12.5 MG PO TABS: 12.5000 mg | ORAL_TABLET | Freq: Three times a day (TID) | ORAL | Status: DC | PRN

## 2011-12-16 NOTE — Progress Notes (Signed)
  Subjective:    Patient ID: Debbie [name], female    DOB: [birth year], 41 y.o.   MRN: [id number]  HPI She has a three-week history of intermittent left earache, dizziness, blurred vision and today she noted slight sore throat on the left. No fever, chills, cough or congestion. Also of note is the fact she stopped taking iron due to constipation. She also had a hysterectomy to treat her cause of the anemia.   Review of Systems     Objective:   Physical Exam alert and in no distress. Tympanic membranes and canals are normal after cerumen was removed from the left canal. Throat is clear. Tonsils are normal. Neck is supple without adenopathy or thyromegaly. Cardiac exam shows a regular sinus rhythm without murmurs or gallops. Lungs are clear to auscultation.       Assessment & Plan:   1. Vertigo  meclizine (ANTIVERT) 12.5 MG tablet  2. Anemia  CBC with Differential   if continued difficulty with vertigo, she is to return here for followup.

## 2011-12-16 NOTE — Patient Instructions (Signed)
Take the dizziness medication and if you continue to have trouble after a week, return here.

## 2011-12-17 ENCOUNTER — Ambulatory Visit: Payer: BC Managed Care – PPO | Admitting: Family Medicine

## 2011-12-17 NOTE — Progress Notes (Signed)
Quick Note:  Left message for pt to call back ______ 

## 2011-12-18 NOTE — Progress Notes (Signed)
Quick Note:  Left message for pt on cell about labs ______

## 2012-07-09 ENCOUNTER — Ambulatory Visit (INDEPENDENT_AMBULATORY_CARE_PROVIDER_SITE_OTHER): Payer: BC Managed Care – PPO | Admitting: Medical

## 2012-07-09 ENCOUNTER — Encounter: Payer: Self-pay | Admitting: Medical

## 2012-07-09 VITALS — BP 130/80 | HR 88 | Temp 98.6°F | Resp 16 | Wt 149.0 lb

## 2012-07-09 DIAGNOSIS — R202 Paresthesia of skin: Secondary | ICD-10-CM

## 2012-07-09 DIAGNOSIS — R42 Dizziness and giddiness: Secondary | ICD-10-CM

## 2012-07-09 DIAGNOSIS — R209 Unspecified disturbances of skin sensation: Secondary | ICD-10-CM

## 2012-07-09 DIAGNOSIS — Z8679 Personal history of other diseases of the circulatory system: Secondary | ICD-10-CM

## 2012-07-09 NOTE — Progress Notes (Signed)
Subjective: Here for not feeling right.  Started earlier in the week with arms hurting in both arms around medial elbows.  This was just enough to know she didn't feel right.   Few days latera had pressure in back of head this week after drinking some wine.   Doesn't normally drink wine, but daughter bought some and she decided to drink some.   Lasted with heavy pressure for 30 minutes, but after this subsided, vision seemed funny.  Has felt a little off balance.  Was fine the next morning, but had similar symptoms for the next few days.  Fingertips went numb yesterday, left thumb index and middle finger.  No numbness of right hand.  She does note some recent stress, less sleep than usual too.  Not sure if this is all related to stress or other, but with her hx/o aneurysm, wanted to come and check thinks out.   Never went for recheck with Dr. Corliss Skains (interventional radiology) last year since they wanted to start again with brain angiogram.  has had some sharp brief pains in left eye the last few weeks.  Denies chest pain, dyspnea, no other numbness, tingling, or weakness.  No fall, no LOC, no other headaches.  Denies any other symptoms.   Past Medical History  Diagnosis Date  . Anemia   . Family history of heart disease in female family member before age 59     LDL over 300 prior  . 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 History of blood transfusion     x2 due to heavy bleeding and anemia from fibroids  . Edema   . Wears glasses   . Chronic headache     since aneurysm diagnosis  . S/P partial hysterectomy     due to uterine fibroids  . Aneurysm of carotid artery     hx/o - coiling 02/07/2010  . Familial hypercholesteremia     LDL over 300; lipoprotein testing 01/2009  . Vitamin D deficiency   . H/O echocardiogram 03/22/01    mild asymmetric LVH, mild aortic sclerosis, mild mitral regurgitation  . H/O cardiovascular stress test 12/08/05     fair exercise capacity, no ST segment changes suggestive of ischemia   ROS as in subjective   Objective:   Physical Exam  Filed Vitals:   07/09/12 1342  BP: 130/80  Pulse: 88  Temp: 98.6 F (37 C)  Resp: 16    General appearance: alert, no distress, WD/WN HEENT: normocephalic, sclerae anicteric, PERRLA, EOMi, nares patent, no discharge or erythema, pharynx normal Oral cavity: MMM, no lesions Neck: supple, no lymphadenopathy, no thyromegaly, no masses, no bruits or JVD Heart: RRR, normal S1, S2, no murmurs Lungs: CTA bilaterally, no wheezes, rhonchi, or rales Musculoskeletal: nontender, no swelling, no obvious deformity Extremities: no edema, no cyanosis, no clubbing Pulses: 2+ symmetric, upper and lower extremities, normal cap refill Neurological: alert, oriented x 3, CN2-12 intact, strength normal upper extremities and lower extremities, sensation normal throughout, DTRs 2+ throughout, slightly off balance with heel to toe, otherwise finger to nose, romberg all normal, gait normal Psychiatric: normal affect, behavior normal, pleasant    Assessment and Plan :    Encounter Diagnoses  Name Primary?  . Disequilibrium Yes  . Paresthesia   . History of aneurysm    Reassured that currently exam findings seem ok.  Discussed her concerns, possible causes including both benign issues as well as possible relationship  to her brain aneurysm.   Discussed IR followup, discussed imaging, discussed watch and wait approach to symptoms.   She has informed her daughter that she hasn't felt her best this week.  Discussed keeping her daughter informed and having daughter check on her this weekend, and if any changes in affect, speech, mentation, go to the ED.  Patient Instructions  Currently your exam findings are normal.  Over the next 24-48 hours if you have any of the following symptoms, call 911 or go to the emergency department:  Vision loss, worsening vision changes  Numbness,  tingling or weakness  Worsening or severe headaches  Headaches with nausea and vomiting  Worse off balance  Chest pain  Otherwise, consider meclizine for vertigo symptoms.  You can use Tylenol for headache  If no worse and improving, please call Monday to give me an update on your symptoms.  Get back in with consult with Dr. Corliss Skains

## 2012-07-09 NOTE — Patient Instructions (Signed)
Currently your exam findings are normal.  Over the next 24-48 hours if you have any of the following symptoms, call 911 or go to the emergency department:  Vision loss, worsening vision changes  Numbness, tingling or weakness  Worsening or severe headaches  Headaches with nausea and vomiting  Worse off balance  Chest pain  Otherwise, consider meclizine for vertigo symptoms.  You can use Tylenol for headache  If no worse and improving, please call Monday to give me an update on your symptoms.  Get back in with consult with Dr. Corliss Skains

## 2012-07-13 ENCOUNTER — Emergency Department (HOSPITAL_COMMUNITY)
Admission: EM | Admit: 2012-07-13 | Discharge: 2012-07-14 | Disposition: A | Discharge status: Home / Self Care | Payer: BC Managed Care – PPO | Attending: Emergency Medicine | Admitting: Emergency Medicine

## 2012-07-13 ENCOUNTER — Encounter (HOSPITAL_COMMUNITY): Payer: Self-pay | Admitting: *Deleted

## 2012-07-13 ENCOUNTER — Emergency Department (HOSPITAL_COMMUNITY): Payer: BC Managed Care – PPO

## 2012-07-13 ENCOUNTER — Telehealth: Payer: Self-pay | Admitting: Family Medicine

## 2012-07-13 DIAGNOSIS — R42 Dizziness and giddiness: Secondary | ICD-10-CM

## 2012-07-13 DIAGNOSIS — Z7982 Long term (current) use of aspirin: Secondary | ICD-10-CM | POA: Insufficient documentation

## 2012-07-13 DIAGNOSIS — Z8679 Personal history of other diseases of the circulatory system: Secondary | ICD-10-CM | POA: Insufficient documentation

## 2012-07-13 DIAGNOSIS — Z862 Personal history of diseases of the blood and blood-forming organs and certain disorders involving the immune mechanism: Secondary | ICD-10-CM | POA: Insufficient documentation

## 2012-07-13 DIAGNOSIS — M791 Myalgia, unspecified site: Secondary | ICD-10-CM

## 2012-07-13 DIAGNOSIS — Z8639 Personal history of other endocrine, nutritional and metabolic disease: Secondary | ICD-10-CM | POA: Insufficient documentation

## 2012-07-13 DIAGNOSIS — IMO0001 Reserved for inherently not codable concepts without codable children: Secondary | ICD-10-CM | POA: Insufficient documentation

## 2012-07-13 DIAGNOSIS — Z79899 Other long term (current) drug therapy: Secondary | ICD-10-CM | POA: Insufficient documentation

## 2012-07-13 DIAGNOSIS — E7800 Pure hypercholesterolemia, unspecified: Secondary | ICD-10-CM | POA: Insufficient documentation

## 2012-07-13 DIAGNOSIS — R0789 Other chest pain: Secondary | ICD-10-CM

## 2012-07-13 LAB — CBC
HCT: 39.3 % (ref 36.0–46.0)
Hemoglobin: 13.4 g/dL (ref 12.0–15.0)
MCH: 30.2 pg (ref 26.0–34.0)
MCHC: 34.1 g/dL (ref 30.0–36.0)
MCV: 88.7 fL (ref 78.0–100.0)
Platelets: 238 10*3/uL (ref 150–400)
RBC: 4.43 MIL/uL (ref 3.87–5.11)
RDW: 12.1 % (ref 11.5–15.5)
WBC: 6.8 10*3/uL (ref 4.0–10.5)

## 2012-07-13 LAB — BASIC METABOLIC PANEL
BUN: 11 mg/dL (ref 6–23)
CO2: 23 mEq/L (ref 19–32)
Calcium: 9.1 mg/dL (ref 8.4–10.5)
Chloride: 104 mEq/L (ref 96–112)
Creatinine, Ser: 0.92 mg/dL (ref 0.50–1.10)
GFR calc Af Amer: 87 mL/min — ABNORMAL LOW (ref >90)
GFR calc non Af Amer: 75 mL/min — ABNORMAL LOW (ref >90)
Glucose, Bld: 108 mg/dL — ABNORMAL HIGH (ref 70–99)
Potassium: 3.5 mEq/L (ref 3.5–5.1)
Sodium: 139 mEq/L (ref 135–145)

## 2012-07-13 LAB — POCT I-STAT TROPONIN I: Troponin i, poc: 0 ng/mL (ref 0.00–0.08)

## 2012-07-13 LAB — PRO B NATRIURETIC PEPTIDE: Pro B Natriuretic peptide (BNP): 38 pg/mL (ref 0–125)

## 2012-07-13 LAB — CK: Total CK: 93 U/L (ref 7–177)

## 2012-07-13 MED ORDER — IBUPROFEN 800 MG PO TABS
800.0000 mg | ORAL_TABLET | Freq: Once | ORAL | Status: AC
  Administered 2012-07-13: 800 mg via ORAL
  Filled 2012-07-13: qty 1

## 2012-07-13 MED ORDER — MECLIZINE HCL 25 MG PO TABS
25.0000 mg | ORAL_TABLET | Freq: Once | ORAL | Status: AC
  Administered 2012-07-13: 25 mg via ORAL
  Filled 2012-07-13: qty 1

## 2012-07-13 NOTE — Telephone Encounter (Signed)
Message copied by Janeice Robinson on Tue Jul 13, 2012 11:56 AM ------      Message from: Jac Canavan      Created: Mon Jul 12, 2012  7:35 AM       Call and see how things are doing today?  Has symptoms improved?  Did they worsen?   Pls help her get reschedule CONSULT appt with Dr. Corliss Skains so they can discuss further evaluation, next steps.   ------

## 2012-07-13 NOTE — ED Notes (Signed)
Pt. C/o upper chest pain radiating to bilateral arms, neck, and back. Family hx of early MI. Pt. Hx of aneurysms. Pt. States pain began Friday night and got worse over the weekend but has since eased off. Pt. States still has mild pain.

## 2012-07-13 NOTE — Telephone Encounter (Signed)
LMOM TO CB. CLS 

## 2012-07-13 NOTE — ED Notes (Signed)
Pt. In xray 

## 2012-07-13 NOTE — ED Notes (Signed)
X week of bilateral arm pain under axilla to the "bone." Last Friday, neck and chest pain, radiating to back. Hx. Of rt. Cerebral and vertebral  Aneurysm.

## 2012-07-13 NOTE — ED Provider Notes (Signed)
History     CSN: [id number]  Arrival date & time 07/13/12  1916   First MD Initiated Contact with Patient 07/13/12 2112      Chief Complaint  Patient presents with  . Chest Pain    (Consider location/radiation/quality/duration/timing/severity/associated sxs/prior treatment) Patient is a 42 y.o. female presenting with chest pain. The history is provided by the patient.  Chest Pain She has been having problems for 2 weeks with a pains in her upper arms which go across her shoulders and into her back and chest. There is also been associated the dizziness with unsteadiness in walking. Symptoms got progressively worse and told about 4 days ago. She saw her PCP who noted that she had difficulty with heel to toe walking and recommended that she not take the meclizine tablets that she take ibuprofen for her aching. She denies fever, chills, sweats. She denies dyspnea, nausea, vomiting. Her dizziness was worse with moving her head or eyes. Nothing made her chest pain better or worse. Pain was 10/10 at its worst but is down to 2/10 today. Pain is constant. She does have history of a cerebral aneurysm. She states that her neurologic exam when she saw her physician 4 days ago was normal except for the key disturbance noted above.  Past Medical History  Diagnosis Date  . Anemia   . Family history of heart disease in female family member before age 90     LDL over 300 prior  . 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 History of blood transfusion     x2 due to heavy bleeding and anemia from fibroids  . Edema   . Wears glasses   . Chronic headache     since aneurysm diagnosis  . S/P partial hysterectomy     due to uterine fibroids  . Aneurysm of carotid artery     hx/o - coiling 02/07/2010  . Familial hypercholesteremia     LDL over 300; lipoprotein testing 01/2009  . Vitamin D deficiency   . H/O echocardiogram 03/22/01    mild asymmetric  LVH, mild aortic sclerosis, mild mitral regurgitation  . H/O cardiovascular stress test 12/08/05    fair exercise capacity, no ST segment changes suggestive of ischemia    Past Surgical History  Procedure Laterality Date  . Cesarean section    . Partial hysterectomy    . Tubal ligation    . Tubal reversal    . Hernia repair      epigastric hernia repair age 56, right inguinal hernia as a child  . Esophagogastroduodenoscopy  2011    Dr. Loreta Ave  . Colonoscopy  2011    Dr. Loreta Ave, anemia workup  . Aneurysm coiling  02/07/10    stent assisted coiling of right internal carotid artery aneurysm in periophthalmic region    Family History  Problem Relation Age of Onset  . Heart failure Sister   . Heart disease Sister 44    stents  . Hypertension Sister   . Heart failure Other   . Cancer Other   . Hypertension Mother   . Obesity Mother   . Heart disease Father 49    hx/o CABG, stening, died of MI at age 57yo  . Diabetes Father   . Hyperlipidemia Father   . Cancer Maternal Grandmother     breast  . Stroke Maternal Grandfather   . Cancer Paternal Grandfather     prostate cancer  History  Substance Use Topics  . Smoking status: Never Smoker   . Smokeless tobacco: Never Used  . Alcohol Use: No    OB History   Grav Para Term Preterm Abortions TAB SAB Ect Mult Living                  Review of Systems  Cardiovascular: Positive for chest pain.  All other systems reviewed and are negative.    Allergies  Contrast media; Flexeril; Lipitor; Penicillins; and Sulfa antibiotics  Home Medications   Current Outpatient Rx  Name  Route  Sig  Dispense  Refill  . aspirin 325 MG tablet   Oral   Take 325 mg by mouth every morning.          . ezetimibe (ZETIA) 10 MG tablet   Oral   Take 10 mg by mouth daily.          . polysaccharide iron (NIFEREX) 150 MG CAPS capsule   Oral   Take 150 mg by mouth 2 (two) times daily.         . rosuvastatin (CRESTOR) 20 MG tablet    Oral   Take 10 mg by mouth daily.         . meclizine (ANTIVERT) 12.5 MG tablet   Oral   Take 12.5 mg by mouth 3 (three) times daily as needed for dizziness.           BP 144/85  Pulse 91  Temp(Src) 98.1 F (36.7 C) (Oral)  SpO2 100%  Physical Exam  Nursing note and vitals reviewed.  42 year old female, resting comfortably and in no acute distress. Vital signs are significant for mild hypertension with blood pressure 144/85. Oxygen saturation is 100%, which is normal. Head is normocephalic and atraumatic. PERRLA, EOMI. Oropharynx is clear. There is no nystagmus. Fundi show no hemorrhage, exudate, or papilledema. Neck is nontender and supple without adenopathy or JVD. There no carotid bruits. Back is nontender and there is no CVA tenderness. Lungs are clear without rales, wheezes, or rhonchi. Chest is nontender. Heart has regular rate and rhythm without murmur. Abdomen is soft, flat, nontender without masses or hepatosplenomegaly and peristalsis is normoactive. Extremities have no cyanosis or edema, full range of motion is present. Skin is warm and dry without rash. Neurologic: Mental status is normal, cranial nerves are intact, there are no motor or sensory deficits. She is mildly unsteady on Romberg testing and tandem gait but standard gait is normal.  ED Course  Procedures (including critical care time)  Results for orders placed during the hospital encounter of 07/13/12  CBC      Result Value Range   WBC 6.8  4.0 - 10.5 K/uL   RBC 4.43  3.87 - 5.11 MIL/uL   Hemoglobin 13.4  12.0 - 15.0 g/dL   HCT 29.5  62.1 - 30.8 %   MCV 88.7  78.0 - 100.0 fL   MCH 30.2  26.0 - 34.0 pg   MCHC 34.1  30.0 - 36.0 g/dL   RDW 65.7  84.6 - 96.2 %   Platelets 238  150 - 400 K/uL  BASIC METABOLIC PANEL      Result Value Range   Sodium 139  135 - 145 mEq/L   Potassium 3.5  3.5 - 5.1 mEq/L   Chloride 104  96 - 112 mEq/L   CO2 23  19 - 32 mEq/L   Glucose, Bld 108 (*) 70 - 99 mg/dL    BUN  11  6 - 23 mg/dL   Creatinine, Ser 0.98  0.50 - 1.10 mg/dL   Calcium 9.1  8.4 - 11.9 mg/dL   GFR calc non Af Amer 75 (*) >90 mL/min   GFR calc Af Amer 87 (*) >90 mL/min  PRO B NATRIURETIC PEPTIDE      Result Value Range   Pro B Natriuretic peptide (BNP) 38.0  0 - 125 pg/mL  POCT I-STAT TROPONIN I      Result Value Range   Troponin i, poc 0.00  0.00 - 0.08 ng/mL   Comment 3            Dg Chest 2 View  07/13/2012   *RADIOLOGY REPORT*  Clinical Data: Chest pain.  CHEST - 2 VIEW  Comparison:  04/23/2011  Findings:  The heart size and mediastinal contours are within normal limits.  Both lungs are clear.  The visualized skeletal structures are unremarkable.  IMPRESSION: No active cardiopulmonary disease.   Original Report Authenticated By: Myles Rosenthal, M.D.   ECG shows normal sinus rhythm with a rate of 92, no ectopy. Normal axis. Normal P wave. Normal QRS. Normal intervals. Normal ST and T waves. Impression: normal ECG. Compared with ECG of 04/22/2011, no significant changes are seen.    1. Vertigo   2. Myalgia   3. Atypical chest pain       MDM  Symptom complex of vertigo and myalgias most consistent with a viral syndrome. Initial laboratory and x-ray testing are normal. I do not see any red flags that would indicate a need for CT or MRI. CK level will be checked and she'll be given therapeutic trial of ibuprofen plus meclizine.  She feels better after above noted treatment. She is discharged with prescriptions for meclizine, naproxen, and Percocet. She is to return if symptoms worsen     Dione Booze, MD 07/14/12 [phone]

## 2012-07-14 MED ORDER — NAPROXEN 500 MG PO TABS: 500.0000 mg | ORAL_TABLET | Freq: Two times a day (BID) | ORAL | Status: DC

## 2012-07-14 MED ORDER — OXYCODONE-ACETAMINOPHEN 5-325 MG PO TABS: 1.0000 | ORAL_TABLET | ORAL | Status: DC | PRN

## 2012-07-14 MED ORDER — MECLIZINE HCL 25 MG PO TABS: 25.0000 mg | ORAL_TABLET | Freq: Three times a day (TID) | ORAL | Status: DC | PRN

## 2012-07-14 NOTE — ED Notes (Signed)
Dr. Glick at bedside.  

## 2012-07-21 NOTE — Telephone Encounter (Signed)
Patient never returned the phone call so I am saving the message to the chart. CLS

## 2012-10-07 ENCOUNTER — Ambulatory Visit (INDEPENDENT_AMBULATORY_CARE_PROVIDER_SITE_OTHER): Payer: BC Managed Care – PPO | Admitting: Family Medicine

## 2012-10-07 ENCOUNTER — Encounter: Payer: Self-pay | Admitting: Family Medicine

## 2012-10-07 VITALS — BP 130/90 | HR 70 | Wt 151.0 lb

## 2012-10-07 DIAGNOSIS — M542 Cervicalgia: Secondary | ICD-10-CM

## 2012-10-07 NOTE — Progress Notes (Signed)
  Subjective:    Patient ID: Debbie [name], female    DOB: 18-Nov-1970, 42 y.o.   MRN: [id number]  HPI She complains of a two-week history of suboccipital pain that she notes especially when she rotates her head. She notes pain especially when she wakes up in the morning and the same area. She notes worsening when she rotates her head to the right. She notes pain in the right neck area but no radiation into her arm. She's tried heat and Aleve one time   Review of Systems     Objective:   Physical Exam Good motion of her neck in all directions. Normal motor, sensory and DTRs.       Assessment & Plan:  Neck pain  recommend supportive care with heat, range of motion and pain medication of choice. If continued difficulty she is to call for further evaluation.

## 2012-10-07 NOTE — Patient Instructions (Signed)
Use heat for 20 minutes 3 times per day and 2 Aleve twice per day do this for the next 7-10 days regularly when you finish with that he then do some gentle stretching. If you keep having difficulty then make an appointment for further evaluation and treatment

## 2012-10-20 ENCOUNTER — Other Ambulatory Visit: Payer: Self-pay | Admitting: Family Medicine

## 2012-10-28 ENCOUNTER — Ambulatory Visit (INDEPENDENT_AMBULATORY_CARE_PROVIDER_SITE_OTHER): Payer: BC Managed Care – PPO | Admitting: Family Medicine

## 2012-10-28 ENCOUNTER — Encounter: Payer: Self-pay | Admitting: Family Medicine

## 2012-10-28 VITALS — BP 114/70 | HR 70 | Wt 152.0 lb

## 2012-10-28 DIAGNOSIS — B351 Tinea unguium: Secondary | ICD-10-CM

## 2012-10-28 DIAGNOSIS — L259 Unspecified contact dermatitis, unspecified cause: Secondary | ICD-10-CM

## 2012-10-28 DIAGNOSIS — Z23 Encounter for immunization: Secondary | ICD-10-CM

## 2012-10-28 DIAGNOSIS — L309 Dermatitis, unspecified: Secondary | ICD-10-CM

## 2012-10-28 NOTE — Progress Notes (Signed)
  Subjective:    Patient ID: Debbie [name], female    DOB: [birth year], 42 y.o.   MRN: [id number]  HPI She is here for multiple issues. She states she has had difficulty last several months with scaling and itching of her scalp. She also notes discoloration under both breasts, right greater than left. This area also itches. She has had a previous history of onychomycosis with good results from Lamisil. She used to several years ago and is now starting to see thickening of the nails again.   Review of Systems     Objective:   Physical Exam Alert and in no distress. Exam of the scalp shows no visible lesions. Hyperpigmentation in the well-defined area is noted under both breasts, the area underneath the right breast is probably 3 x 6 cm. Exam of her toenails do show some thickening and discoloration       Assessment & Plan:  Dermatitis - Plan: Ambulatory referral to Dermatology  Onychomycosis  Need for prophylactic vaccination and inoculation against influenza - Plan: Flu Vaccine QUAD 36+ mos IM  I will have dermatology evaluate the scalp and also make comments on treatment of probable yeast underneath both breasts. Also discussed risks and benefits of flu shot and she will be given one.

## 2012-12-28 ENCOUNTER — Other Ambulatory Visit: Payer: Self-pay | Admitting: Family Medicine

## 2012-12-29 ENCOUNTER — Telehealth: Payer: Self-pay | Admitting: Medical

## 2012-12-29 NOTE — Telephone Encounter (Signed)
Debbie [name], they sent me this message on this patient about her iron medication. It was denied. Can she get  A refill on her iron medication? CLS

## 2012-12-29 NOTE — Telephone Encounter (Signed)
I last saw her in May.  Why are we getting an insurance denial for iron now out of the blue?

## 2012-12-30 NOTE — Telephone Encounter (Signed)
Debbie [name] this is not an insurance denial,  [facility] says we denied the iron refill.  Will you please RF her iron medicine

## 2012-12-31 ENCOUNTER — Other Ambulatory Visit: Payer: Self-pay | Admitting: Family Medicine

## 2012-12-31 MED ORDER — POLYSACCHARIDE IRON COMPLEX 150 MG PO CAPS: 150.0000 mg | ORAL_CAPSULE | Freq: Two times a day (BID) | ORAL | Status: DC

## 2012-12-31 NOTE — Telephone Encounter (Signed)
Medication refills was sent to the pharmacy per [facility] PA-C. CLS

## 2012-12-31 NOTE — Telephone Encounter (Signed)
Rx refills sent. CLS 

## 2012-12-31 NOTE — Telephone Encounter (Signed)
pls refill her Niferex/iron with 3 refills

## 2012-12-31 NOTE — Telephone Encounter (Signed)
RX refills sent. CLS

## 2013-01-05 ENCOUNTER — Encounter: Payer: Self-pay | Admitting: Medical

## 2013-01-05 ENCOUNTER — Ambulatory Visit (INDEPENDENT_AMBULATORY_CARE_PROVIDER_SITE_OTHER): Payer: BC Managed Care – PPO | Admitting: Medical

## 2013-01-05 VITALS — BP 122/80 | HR 60 | Temp 98.1°F | Resp 16 | Wt 153.0 lb

## 2013-01-05 DIAGNOSIS — M79609 Pain in unspecified limb: Secondary | ICD-10-CM

## 2013-01-05 DIAGNOSIS — M7918 Myalgia, other site: Secondary | ICD-10-CM

## 2013-01-05 DIAGNOSIS — R202 Paresthesia of skin: Secondary | ICD-10-CM

## 2013-01-05 DIAGNOSIS — IMO0001 Reserved for inherently not codable concepts without codable children: Secondary | ICD-10-CM

## 2013-01-05 DIAGNOSIS — M79604 Pain in right leg: Secondary | ICD-10-CM

## 2013-01-05 DIAGNOSIS — R209 Unspecified disturbances of skin sensation: Secondary | ICD-10-CM

## 2013-01-05 DIAGNOSIS — M549 Dorsalgia, unspecified: Secondary | ICD-10-CM

## 2013-01-05 LAB — COMPREHENSIVE METABOLIC PANEL
ALT: 12 U/L (ref 0–35)
AST: 13 U/L (ref 0–37)
Albumin: 4.1 g/dL (ref 3.5–5.2)
Alkaline Phosphatase: 46 U/L (ref 39–117)
BUN: 12 mg/dL (ref 6–23)
CO2: 26 mEq/L (ref 19–32)
Calcium: 9.4 mg/dL (ref 8.4–10.5)
Chloride: 103 mEq/L (ref 96–112)
Creat: 0.79 mg/dL (ref 0.50–1.10)
Glucose, Bld: 91 mg/dL (ref 70–99)
Potassium: 4 mEq/L (ref 3.5–5.3)
Sodium: 138 mEq/L (ref 135–145)
Total Bilirubin: 0.8 mg/dL (ref 0.3–1.2)
Total Protein: 7.3 g/dL (ref 6.0–8.3)

## 2013-01-05 LAB — CBC
HCT: 40.2 % (ref 36.0–46.0)
Hemoglobin: 13.6 g/dL (ref 12.0–15.0)
MCH: 29.4 pg (ref 26.0–34.0)
MCHC: 33.8 g/dL (ref 30.0–36.0)
MCV: 86.8 fL (ref 78.0–100.0)
Platelets: 324 10*3/uL (ref 150–400)
RBC: 4.63 MIL/uL (ref 3.87–5.11)
RDW: 13 % (ref 11.5–15.5)
WBC: 7.1 10*3/uL (ref 4.0–10.5)

## 2013-01-05 LAB — TSH: TSH: 0.865 u[IU]/mL (ref 0.350–4.500)

## 2013-01-05 LAB — SEDIMENTATION RATE: Sed Rate: 10 mm/hr (ref 0–22)

## 2013-01-05 LAB — VITAMIN B12: Vitamin B-12: 238 pg/mL (ref 211–911)

## 2013-01-05 LAB — CK: Total CK: 73 U/L (ref 7–177)
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
## 2013-01-07 ENCOUNTER — Other Ambulatory Visit: Payer: Self-pay | Admitting: Medical

## 2013-01-07 DIAGNOSIS — M549 Dorsalgia, unspecified: Secondary | ICD-10-CM

## 2013-01-07 DIAGNOSIS — R202 Paresthesia of skin: Secondary | ICD-10-CM

## 2013-01-07 DIAGNOSIS — M79604 Pain in right leg: Secondary | ICD-10-CM

## 2013-02-04 ENCOUNTER — Ambulatory Visit
Admission: RE | Admit: 2013-02-04 | Discharge: 2013-02-04 | Disposition: A | Discharge status: Home / Self Care | Payer: BC Managed Care – PPO | Source: Ambulatory Visit | Attending: Medical | Admitting: Medical

## 2013-02-04 DIAGNOSIS — R202 Paresthesia of skin: Secondary | ICD-10-CM

## 2013-02-04 DIAGNOSIS — M79604 Pain in right leg: Secondary | ICD-10-CM

## 2013-02-04 DIAGNOSIS — M549 Dorsalgia, unspecified: Secondary | ICD-10-CM

## 2013-03-30 ENCOUNTER — Telehealth: Payer: Self-pay | Admitting: Medical

## 2013-03-30 ENCOUNTER — Encounter: Payer: Self-pay | Admitting: Medical

## 2013-03-30 ENCOUNTER — Ambulatory Visit (INDEPENDENT_AMBULATORY_CARE_PROVIDER_SITE_OTHER): Payer: BC Managed Care – PPO | Admitting: Medical

## 2013-03-30 VITALS — BP 140/78 | HR 78 | Temp 98.3°F | Resp 16 | Wt 153.0 lb

## 2013-03-30 DIAGNOSIS — H15102 Unspecified episcleritis, left eye: Secondary | ICD-10-CM

## 2013-03-30 DIAGNOSIS — R202 Paresthesia of skin: Secondary | ICD-10-CM

## 2013-03-30 DIAGNOSIS — H15009 Unspecified scleritis, unspecified eye: Secondary | ICD-10-CM

## 2013-03-30 DIAGNOSIS — I671 Cerebral aneurysm, nonruptured: Secondary | ICD-10-CM

## 2013-03-30 DIAGNOSIS — R209 Unspecified disturbances of skin sensation: Secondary | ICD-10-CM

## 2013-03-30 MED ORDER — KETOROLAC TROMETHAMINE 0.4 % OP SOLN: 1.0000 [drp] | Freq: Four times a day (QID) | OPHTHALMIC | Status: DC

## 2013-03-30 NOTE — Telephone Encounter (Signed)
Please call [facility] [place] [place] to see if they monitor/manage known brain aneurysm?   I have a patient with what has been a stable aneurysm, but she would like another opinion/different approach.  Had been seeing interventional radiologist, but she fears going there because she says he only wants to get angiogram every time she has been back.    She is not comfortable with this approach.    Let me know if they will see her.  If not who do they prefer for this.

## 2013-03-30 NOTE — Progress Notes (Signed)
Subjective: Here for possible ruptured blood vessel in left eye.   Just noticed the finding this morning of red/blood in left eye, felt pressure behind the eye, some photophobia, but no significant pain, no vision loss, no blurred vision, currently just has some mild discomfort. Denies prior similar. Otherwise no current numbness, tingling, weakness, slurred speech, vision change. She just started a new exercise routine in last 2 days and wonders that this caused a blood vessel rupture.  She gets concern any time something odd happens given her history of aneurysm.  I saw her in November for back and leg pain, radicular pain, and tingling in feet.  Since last visit, all these symptoms resolved.      Past Medical History  Diagnosis Date  . Anemia   . Family history of heart disease in female family member before age 18     LDL over 300 prior  . 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 History of blood transfusion     x2 due to heavy bleeding and anemia from fibroids  . Edema   . Wears glasses   . Chronic headache     since aneurysm diagnosis  . S/P partial hysterectomy     due to uterine fibroids  . Aneurysm of carotid artery     hx/o - coiling 02/07/2010  . Familial hypercholesteremia     LDL over 300; lipoprotein testing 01/2009  . Vitamin D deficiency   . H/O echocardiogram 03/22/01    mild asymmetric LVH, mild aortic sclerosis, mild mitral regurgitation  . H/O cardiovascular stress test 12/08/05    fair exercise capacity, no ST segment changes suggestive of ischemia   Past Surgical History  Procedure Laterality Date  . Cesarean section    . Partial hysterectomy    . Tubal ligation    . Tubal reversal    . Hernia repair      epigastric hernia repair age 12, right inguinal hernia as a child  . Esophagogastroduodenoscopy  2011    Dr. Loreta Ave  . Colonoscopy  2011    Dr. Loreta Ave, anemia workup  . Aneurysm coiling  02/07/10   stent assisted coiling of right internal carotid artery aneurysm in periophthalmic region     ROS as in subjective  Objective:   Physical Exam  Filed Vitals:   03/30/13 1000  BP: 140/78  Pulse: 78  Temp: 98.3 F (36.8 C)  Resp: 16    General appearance: alert, no distress, WD/WN, pleasant AA female Eyes: left eye medial sclera with 3mm area of eruythema, ruptured vessel vs inflammation suggestive of episcleritis, seems to have photophobia, otherwise no deformity, PERRLA, EOMi, no discharge Neck: supple, no lymphadenopathy, no thyromegaly, no masses, no bruits Heart: RRR, normal S1, S2, no murmurs Lungs: CTA bilaterally, no wheezes, rhonchi, or rales Pulses: 2+ symmetric, upper and lower extremities, normal cap refill Neurological: alert, oriented x 3, CN2-12 intact, strength normal upper extremities and lower extremities, sensation normal throughout, DTRs 2+ throughout, no cerebellar signs, gait normal Psychiatric: normal affect, behavior normal, pleasant    Assessment and Plan :    Encounter Diagnoses  Name Primary?  . Episcleritis of left eye Yes  . Brain aneurysm   . Paresthesia    Episcleritis - discussed symptoms, exam, diagnosis, treatment options.   Script today for Ketoralac ophthalmic solution.  If not improving in the next several days, f/u with eye doctor.  Brain aneurysm - hx/o coiling  2011 procedure with interventional radiology.  Would like different opinion, referral to neurology  paresthesia - resolved since last visit.

## 2013-04-04 NOTE — Telephone Encounter (Signed)
I called [facility] to [facility] and they said they do not monitor/ manage brain aneurysms. CLS

## 2013-04-04 NOTE — Telephone Encounter (Signed)
At this point, see if [facility] or [facility] will see her for this.  If so, refer.  If not, [facility] may [facility]

## 2013-04-05 ENCOUNTER — Other Ambulatory Visit: Payer: Self-pay | Admitting: Family Medicine

## 2013-04-05 DIAGNOSIS — I671 Cerebral aneurysm, nonruptured: Secondary | ICD-10-CM

## 2013-04-05 NOTE — Telephone Encounter (Signed)
Referral is done. I fax her information over to [facility]. CLS

## 2013-06-21 LAB — HM MAMMOGRAPHY: HM Mammogram: NEGATIVE

## 2013-07-01 ENCOUNTER — Encounter: Payer: Self-pay | Admitting: Internal Medicine

## 2013-07-01 ENCOUNTER — Encounter: Payer: Self-pay | Admitting: Medical

## 2013-07-08 ENCOUNTER — Encounter: Payer: BC Managed Care – PPO | Admitting: Medical

## 2013-07-18 ENCOUNTER — Emergency Department (HOSPITAL_COMMUNITY)
Admission: EM | Admit: 2013-07-18 | Discharge: 2013-07-18 | Disposition: A | Discharge status: Home / Self Care | Payer: BC Managed Care – PPO | Source: Home / Self Care | Attending: Family Medicine | Admitting: Family Medicine

## 2013-07-18 ENCOUNTER — Encounter (HOSPITAL_COMMUNITY): Payer: Self-pay | Admitting: Emergency Medicine

## 2013-07-18 DIAGNOSIS — J40 Bronchitis, not specified as acute or chronic: Secondary | ICD-10-CM

## 2013-07-18 MED ORDER — FLUTICASONE PROPIONATE 50 MCG/ACT NA SUSP: 2.0000 | Freq: Every day | NASAL | Status: DC

## 2013-07-18 MED ORDER — PREDNISONE 10 MG PO TABS: 30.0000 mg | ORAL_TABLET | Freq: Every day | ORAL | Status: DC

## 2013-07-18 MED ORDER — IPRATROPIUM-ALBUTEROL 0.5-2.5 (3) MG/3ML IN SOLN
RESPIRATORY_TRACT | Status: AC
  Filled 2013-07-18: qty 3

## 2013-07-18 MED ORDER — IPRATROPIUM-ALBUTEROL 0.5-2.5 (3) MG/3ML IN SOLN
3.0000 mL | Freq: Once | RESPIRATORY_TRACT | Status: AC
  Administered 2013-07-18: 3 mL via RESPIRATORY_TRACT

## 2013-07-18 MED ORDER — ALBUTEROL SULFATE HFA 108 (90 BASE) MCG/ACT IN AERS: 2.0000 | INHALATION_SPRAY | Freq: Four times a day (QID) | RESPIRATORY_TRACT | Status: DC | PRN

## 2013-07-18 MED ORDER — GUAIFENESIN-CODEINE 100-10 MG/5ML PO SOLN: 5.0000 mL | Freq: Every evening | ORAL | Status: DC | PRN

## 2013-07-18 NOTE — Discharge Instructions (Signed)
Thank you for coming in today. Take medications as directed.  Do not take codiene at work or before driving.  Call or go to the emergency room if you get worse, have trouble breathing, have chest pains, or palpitations.   Bronchitis Bronchitis is inflammation of the airways that extend from the windpipe into the lungs (bronchi). The inflammation often causes mucus to develop, which leads to a cough. If the inflammation becomes severe, it may cause shortness of breath. CAUSES  Bronchitis may be caused by:   Viral infections.   Bacteria.   Cigarette smoke.   Allergens, pollutants, and other irritants.  SIGNS AND SYMPTOMS  The most common symptom of bronchitis is a frequent cough that produces mucus. Other symptoms include:  Fever.   Body aches.   Chest congestion.   Chills.   Shortness of breath.   Sore throat.  DIAGNOSIS  Bronchitis is usually diagnosed through a medical history and physical exam. Tests, such as chest X-rays, are sometimes done to rule out other conditions.  TREATMENT  You may need to avoid contact with whatever caused the problem (smoking, for example). Medicines are sometimes needed. These may include:  Antibiotics. These may be prescribed if the condition is caused by bacteria.  Cough suppressants. These may be prescribed for relief of cough symptoms.   Inhaled medicines. These may be prescribed to help open your airways and make it easier for you to breathe.   Steroid medicines. These may be prescribed for those with recurrent (chronic) bronchitis. HOME CARE INSTRUCTIONS  Get plenty of rest.   Drink enough fluids to keep your urine clear or pale yellow (unless you have a medical condition that requires fluid restriction). Increasing fluids may help thin your secretions and will prevent dehydration.   Only take over-the-counter or prescription medicines as directed by your health care provider.  Only take antibiotics as directed.  Make sure you finish them even if you start to feel better.  Avoid secondhand smoke, irritating chemicals, and strong fumes. These will make bronchitis worse. If you are a smoker, quit smoking. Consider using nicotine gum or skin patches to help control withdrawal symptoms. Quitting smoking will help your lungs heal faster.   Put a cool-mist humidifier in your bedroom at night to moisten the air. This may help loosen mucus. Change the water in the humidifier daily. You can also run the hot water in your shower and sit in the bathroom with the door closed for 5 10 minutes.   Follow up with your health care provider as directed.   Wash your hands frequently to avoid catching bronchitis again or spreading an infection to others.  SEEK MEDICAL CARE IF: Your symptoms do not improve after 1 week of treatment.  SEEK IMMEDIATE MEDICAL CARE IF:  Your fever increases.  You have chills.   You have chest pain.   You have worsening shortness of breath.   You have bloody sputum.  You faint.  You have lightheadedness.  You have a severe headache.   You vomit repeatedly. MAKE SURE YOU:   Understand these instructions.  Will watch your condition.  Will get help right away if you are not doing well or get worse. Document Released: 02/10/2005 Document Revised: 12/01/2012 Document Reviewed: 10/05/2012 [facility] Patient Information 2014 [place], [place].

## 2013-07-18 NOTE — ED Notes (Addendum)
C/o cough x 1 week. Cough prod. past 2 days green sputum.  Nasal congestion is yellow x 2 days.  Cough cont. when she lays down. Can't take a deep breath or she coughs.

## 2013-07-18 NOTE — ED Provider Notes (Signed)
Debbie [name] is a 43 y.o. female who presents to Urgent Care today for cough congestion and wheezing. Symptoms present for one week. Cough is productive of sputum. Patient is treated Mucinex and cold medications which have helped. No chest pain palpitations nausea vomiting or diarrhea.   Past Medical History  Diagnosis Date  . Anemia   . Family history of heart disease in female family member before age 37     LDL over 300 prior  . 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 History of blood transfusion     x2 due to heavy bleeding and anemia from fibroids  . Edema   . Wears glasses   . Chronic headache     since aneurysm diagnosis  . S/P partial hysterectomy     due to uterine fibroids  . Aneurysm of carotid artery     hx/o - coiling 02/07/2010  . Familial hypercholesteremia     LDL over 300; lipoprotein testing 01/2009  . Vitamin D deficiency   . H/O echocardiogram 03/22/01    mild asymmetric LVH, mild aortic sclerosis, mild mitral regurgitation  . H/O cardiovascular stress test 12/08/05    fair exercise capacity, no ST segment changes suggestive of ischemia   History  Substance Use Topics  . Smoking status: Never Smoker   . Smokeless tobacco: Never Used  . Alcohol Use: No   ROS as above Medications: No current facility-administered medications for this encounter.   Current Outpatient Prescriptions  Medication Sig Dispense Refill  . aspirin 325 MG tablet Take 325 mg by mouth every morning.       Marland Kitchen CRESTOR 20 MG tablet TAKE 1/2 TABLET (10 MG TOTAL) BY MOUTH AT BEDTIME.  30 tablet  3  . ezetimibe (ZETIA) 10 MG tablet Take 10 mg by mouth daily.       . vitamin B-12 (CYANOCOBALAMIN) 1000 MCG tablet Take 1,000 mcg by mouth daily.      Marland Kitchen albuterol (PROVENTIL HFA;VENTOLIN HFA) 108 (90 BASE) MCG/ACT inhaler Inhale 2 puffs into the lungs every 6 (six) hours as needed for wheezing or shortness of breath.  1 Inhaler  2  .  fluticasone (FLONASE) 50 MCG/ACT nasal spray Place 2 sprays into both nostrils daily.  16 g  2  . guaiFENesin-codeine 100-10 MG/5ML syrup Take 5 mLs by mouth at bedtime as needed for cough.  120 mL  0  . predniSONE (DELTASONE) 10 MG tablet Take 3 tablets (30 mg total) by mouth daily.  15 tablet  0    Exam:  BP 138/82  Pulse 78  Temp(Src) 98.9 F (37.2 C) (Oral)  Resp 16  SpO2 100% Gen: Well NAD HEENT: EOMI,  MMM posterior pharynx with cobblestoning. Tympanic membranes are normal appearing bilaterally Lungs: Normal work of breathing. CTABL Heart: RRR no MRG Abd: NABS, Soft. NT, ND Exts: Brisk capillary refill, warm and well perfused.   Patient was given a DuoNeb nebulizer treatment and felt much better.  No results found for this or any previous visit (from the past 24 hour(s)). No results found.  Assessment and Plan: 43 y.o. female with bronchitis. Plan to treat with prednisone, albuterol, codeine containing cough medication and Flonase nasal spray.  Discussed warning signs or symptoms. Please see discharge instructions. Patient expresses understanding.    Rodolph Bong, MD 07/18/13 2056

## 2013-08-05 ENCOUNTER — Emergency Department (INDEPENDENT_AMBULATORY_CARE_PROVIDER_SITE_OTHER)
Admission: EM | Admit: 2013-08-05 | Discharge: 2013-08-05 | Disposition: A | Discharge status: Home / Self Care | Payer: BC Managed Care – PPO | Source: Home / Self Care | Attending: Family Medicine | Admitting: Family Medicine

## 2013-08-05 ENCOUNTER — Encounter (HOSPITAL_COMMUNITY): Payer: Self-pay | Admitting: Emergency Medicine

## 2013-08-05 DIAGNOSIS — R059 Cough, unspecified: Secondary | ICD-10-CM

## 2013-08-05 DIAGNOSIS — R05 Cough: Secondary | ICD-10-CM

## 2013-08-05 DIAGNOSIS — J329 Chronic sinusitis, unspecified: Secondary | ICD-10-CM

## 2013-08-05 MED ORDER — GUAIFENESIN-CODEINE 100-10 MG/5ML PO SOLN: 5.0000 mL | ORAL | Status: DC | PRN

## 2013-08-05 MED ORDER — AEROCHAMBER PLUS FLO-VU LARGE MISC
1.0000 | Freq: Once | Status: AC
  Administered 2013-08-05: 1

## 2013-08-05 MED ORDER — DOXYCYCLINE CALCIUM 50 MG/5ML PO SYRP: 100.0000 mg | ORAL_SOLUTION | Freq: Two times a day (BID) | ORAL | Status: DC

## 2013-08-05 NOTE — Discharge Instructions (Signed)

## 2013-08-05 NOTE — ED Notes (Signed)
Patient has a cough that has lingered since being seen 5/25.  Was seen 5/25 and treated with codeine cough medicine, prednisone and flonase per patient.  Patient has been using a friends nebulizer.  Patient reports chest pain described as burning with cough.  Patient is blowing and coughing up green/yellow secretions

## 2013-08-05 NOTE — ED Provider Notes (Signed)
CSN: [id number]     Arrival date & time 08/05/13  [id number] History   First MD Initiated Contact with Patient 08/05/13 [phone]     Chief Complaint  Patient presents with  . Cough   (Consider location/radiation/quality/duration/timing/severity/associated sxs/prior Treatment) HPI Comments: 43 year old female presents complaining of being sick now from was 1 month. She has sinus pressure, sinus congestion, postnasal drip, nasal congestion, productive cough for one month. She was seen here on May 25 and was treated with prednisone, and an inhaler, a cough medicine, and Flonase. She feels like she is still not getting any better. She denies chest pain or shortness of breath. She denies leg swelling or recent travel.  Patient is a 43 y.o. female presenting with cough.  Cough Associated symptoms: rhinorrhea, sore throat and wheezing   Associated symptoms: no chest pain, no chills, no ear pain, no fever and no shortness of breath     Past Medical History  Diagnosis Date  . Anemia   . Family history of heart disease in female family member before age 45     LDL over 300 prior  . 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 History of blood transfusion     x2 due to heavy bleeding and anemia from fibroids  . Edema   . Wears glasses   . Chronic headache     since aneurysm diagnosis  . S/P partial hysterectomy     due to uterine fibroids  . Aneurysm of carotid artery     hx/o - coiling 02/07/2010  . Familial hypercholesteremia     LDL over 300; lipoprotein testing 01/2009  . Vitamin D deficiency   . H/O echocardiogram 03/22/01    mild asymmetric LVH, mild aortic sclerosis, mild mitral regurgitation  . H/O cardiovascular stress test 12/08/05    fair exercise capacity, no ST segment changes suggestive of ischemia   Past Surgical History  Procedure Laterality Date  . Cesarean section    . Partial hysterectomy    . Tubal ligation    . Tubal  reversal    . Hernia repair      epigastric hernia repair age [age], right inguinal hernia as a child  . Esophagogastroduodenoscopy  2011    Dr. Loreta AveMann  . Colonoscopy  2011    Dr. Loreta AveMann, anemia workup  . Aneurysm coiling  02/07/10    stent assisted coiling of right internal carotid artery aneurysm in periophthalmic region  . Abdominal hysterectomy    . Tubal reversal  2008   Family History  Problem Relation Age of Onset  . Heart failure Sister   . Heart disease Sister 44    stents  . Hypertension Sister   . Heart failure Other   . Cancer Other   . Hypertension Mother   . Obesity Mother   . Cancer Mother     breast  . Heart disease Father 1547    hx/o CABG, stening, died of MI at age 45yo  . Diabetes Father   . Hyperlipidemia Father   . Cancer Maternal Grandmother     breast  . Stroke Maternal Grandfather   . Cancer Paternal Grandfather     prostate cancer   History  Substance Use Topics  . Smoking status: Never Smoker   . Smokeless tobacco: Never Used  . Alcohol Use: No   OB History   Grav Para Term Preterm Abortions TAB SAB Ect Mult Living  Review of Systems  Constitutional: Negative for fever and chills.  HENT: Positive for congestion, postnasal drip, rhinorrhea, sinus pressure and sore throat. Negative for ear pain.   Respiratory: Positive for cough and wheezing. Negative for shortness of breath.   Cardiovascular: Negative for chest pain, palpitations and leg swelling.  All other systems reviewed and are negative.   Allergies  Contrast media; Flexeril; Lipitor; Penicillins; and Sulfa antibiotics  Home Medications   Prior to Admission medications   Medication Sig Start Date End Date Taking? Authorizing Provider  albuterol (PROVENTIL HFA;VENTOLIN HFA) 108 (90 BASE) MCG/ACT inhaler Inhale 2 puffs into the lungs every 6 (six) hours as needed for wheezing or shortness of breath. 07/18/13   Rodolph Bong, MD  aspirin 325 MG tablet Take 325 mg by mouth  every morning.     Historical Provider, MD  CRESTOR 20 MG tablet TAKE 1/2 TABLET (10 MG TOTAL) BY MOUTH AT BEDTIME. 10/20/12   Ronnald Nian, MD  doxycycline (VIBRAMYCIN) 50 MG/5ML SYRP Take 10 mLs (100 mg total) by mouth 2 (two) times daily. 08/05/13   Graylon Good, PA-C  ezetimibe (ZETIA) 10 MG tablet Take 10 mg by mouth daily.  10/09/10   Ronnald Nian, MD  fluticasone (FLONASE) 50 MCG/ACT nasal spray Place 2 sprays into both nostrils daily. 07/18/13   Rodolph Bong, MD  guaiFENesin-codeine 100-10 MG/5ML syrup Take 5 mLs by mouth at bedtime as needed for cough. 07/18/13   Rodolph Bong, MD  guaiFENesin-codeine 100-10 MG/5ML syrup Take 5 mLs by mouth every 4 (four) hours as needed for cough. 08/05/13   Adrian Blackwater Kanav Kazmierczak, PA-C  predniSONE (DELTASONE) 10 MG tablet Take 3 tablets (30 mg total) by mouth daily. 07/18/13   Rodolph Bong, MD  vitamin B-12 (CYANOCOBALAMIN) 1000 MCG tablet Take 1,000 mcg by mouth daily.    Historical Provider, MD   BP 126/82  Pulse 77  Temp(Src) 98.2 F (36.8 C) (Oral)  Resp 16  SpO2 100% Physical Exam  Nursing note and vitals reviewed. Constitutional: She is oriented to person, place, and time. Vital signs are normal. She appears well-developed and well-nourished. No distress.  HENT:  Head: Normocephalic and atraumatic.  Right Ear: External ear normal.  Left Ear: External ear normal.  Nose: Nose normal.  Mouth/Throat: Oropharynx is clear and moist. No oropharyngeal exudate.  Eyes: Conjunctivae are normal. Right eye exhibits no discharge. Left eye exhibits no discharge.  Neck: Normal range of motion. Neck supple.  Cardiovascular: Normal rate, regular rhythm and normal heart sounds.  Exam reveals no gallop and no friction rub.   No murmur heard. Pulmonary/Chest: Effort normal. No respiratory distress. She has wheezes (faint, scattered).  Lymphadenopathy:    She has no cervical adenopathy.  Neurological: She is alert and oriented to person, place, and time. She has  normal strength. Coordination normal.  Skin: Skin is warm and dry. No rash noted. She is not diaphoretic.  Psychiatric: She has a normal mood and affect. Judgment normal.    ED Course  Procedures (including critical care time) Labs Review Labs Reviewed - No data to display  Imaging Review No results found.   MDM   1. Sinusitis   2. Cough    Sinusitis symptoms, not getting any better after one month, will treat with doxycycline in addition to symptomatic management. Followup as needed   Meds ordered this encounter  Medications  . doxycycline (VIBRAMYCIN) 50 MG/5ML SYRP    Sig: Take 10 mLs (100  mg total) by mouth 2 (two) times daily.    Dispense:  200 mL    Refill:  0    Order Specific Question:  Supervising Provider    Answer:  Lorenz CoasterKELLER, DAVID C [id number]]  . guaiFENesin-codeine 100-10 MG/5ML syrup    Sig: Take 5 mLs by mouth every 4 (four) hours as needed for cough.    Dispense:  120 mL    Refill:  0    Order Specific Question:  Supervising Provider    Answer:  Lorenz CoasterKELLER, DAVID C [id number]]  . AEROCHAMBER PLUS FLO-VU LARGE MISC 1 each    Sig:        Graylon GoodZachary H Jamason Peckham, PA-C 08/05/13 [phone]

## 2013-08-09 ENCOUNTER — Telehealth: Payer: Self-pay | Admitting: Family Medicine

## 2013-08-09 NOTE — Telephone Encounter (Signed)
ER letter sent 

## 2013-08-10 NOTE — ED Provider Notes (Signed)
Medical screening examination/treatment/procedure(s) were performed by a resident physician or non-physician practitioner and as the supervising physician I was immediately available for consultation/collaboration.  Leia Coletti, MD    Phyllip Claw S Brecklynn Jian, MD 08/10/13 0737 

## 2013-08-11 ENCOUNTER — Ambulatory Visit (INDEPENDENT_AMBULATORY_CARE_PROVIDER_SITE_OTHER): Payer: BC Managed Care – PPO | Admitting: Family Medicine

## 2013-08-11 ENCOUNTER — Encounter: Payer: Self-pay | Admitting: Family Medicine

## 2013-08-11 VITALS — BP 120/82 | HR 72 | Temp 98.2°F | Ht 64.5 in | Wt 153.0 lb

## 2013-08-11 DIAGNOSIS — R071 Chest pain on breathing: Secondary | ICD-10-CM

## 2013-08-11 DIAGNOSIS — R0789 Other chest pain: Secondary | ICD-10-CM

## 2013-08-11 NOTE — Patient Instructions (Signed)
  Drink plenty of fluids. Consider sinus rinses and/or decongestant (phenylephrine or pseudoephedrine) for sinus congestion and pain.  Try heat (vs ice) to the right ribs.  Use Aleve 2 tablets twice daily with food until the pain is better.  You may also use tylenol along with this, if needed for pain.  You may also use the cough syrup as needed--this will help you cough less, so it will heal easier, and it also has codeine which is a pain reliever (use caution--this might make you sleepy). Consider using guaifenein (in mucinex or robitussin) during the day to keep your secretions thin (sinus and phlegm in the chest) if you aren't using the prescription cough medicine (that has guaifenesin in it--don't use both robitussin and the prescription).

## 2013-08-11 NOTE — Progress Notes (Signed)
Chief Complaint  Patient presents with  . Rib pain    has been coughing for 5 weeks, was finally given abx this past Friday, was on prednisone 2 weeks prior. Feels like she injured one, possibly 2 ribs on the right side from excessive coughing. Cannot lay on stomach or on right side. Hurts when she takes deep breath in and/or cough.    She has been sick with cough and sinus problems for 5 weeks.  She finally was put on doxycycline on 6/12, along with cough syrup and albuterol.  Her cough used to be tight and dry, and now it is looser, productive of yellow phlegm.  She is feeling less short of breath, needing the albuterol less.  (she used a friend's nebulizer at night for a week, but hasn't needed in over a week).  Denies fevers.  She had nausea when taking the doxy on an empty stomach, tolerating it better with food.  Using the guaifenesin with codeine about 2x/day, just got refilled on Friday  Her cough has been a lot better, but she is having pain on the right side of her ribs when she coughs.  It feels better if she puts pressure on her ribs.  It is sore to touch on her right side--it is hard to sleep at night on that side (which is how she normally sleeps).  She took 880mg  of naproxen yesterday, and that helped, she was able to sleep well.  There are two spots that are tender along the lower ribs on her right side.  Some head congestion.   Past Medical History  Diagnosis Date  . Anemia   . Family history of heart disease in female family member before age 45     LDL over 300 prior  . 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 History of blood transfusion     x2 due to heavy bleeding and anemia from fibroids  . Edema   . Wears glasses   . Chronic headache     since aneurysm diagnosis  . S/P partial hysterectomy     due to uterine fibroids  . Aneurysm of carotid artery     hx/o - coiling 02/07/2010  . Familial hypercholesteremia    LDL over 300; lipoprotein testing 01/2009  . Vitamin D deficiency   . H/O echocardiogram 03/22/01    mild asymmetric LVH, mild aortic sclerosis, mild mitral regurgitation  . H/O cardiovascular stress test 12/08/05    fair exercise capacity, no ST segment changes suggestive of ischemia   Past Surgical History  Procedure Laterality Date  . Cesarean section    . Partial hysterectomy    . Tubal ligation    . Tubal reversal    . Hernia repair      epigastric hernia repair age 43, right inguinal hernia as a child  . Esophagogastroduodenoscopy  2011    Dr. Loreta AveMann  . Colonoscopy  2011    Dr. Loreta AveMann, anemia workup  . Aneurysm coiling  02/07/10    stent assisted coiling of right internal carotid artery aneurysm in periophthalmic region  . Abdominal hysterectomy    . Tubal reversal  2008   History   Social History  . Marital Status: Married    Spouse Name: N/A    Number of Children: N/A  . Years of Education: N/A   Occupational History  . TEACHER, [employer] high school    Social History Main Topics  .  Smoking status: Never Smoker   . Smokeless tobacco: Never Used  . Alcohol Use: No  . Drug Use: No  . Sexual Activity: Not on file   Other Topics Concern  . Not on file   Social History Narrative   Married, 4 children, exercise with trainer in 12/2010, walking some   Current Outpatient Prescriptions on File Prior to Visit  Medication Sig Dispense Refill  . albuterol (PROVENTIL HFA;VENTOLIN HFA) 108 (90 BASE) MCG/ACT inhaler Inhale 2 puffs into the lungs every 6 (six) hours as needed for wheezing or shortness of breath.  1 Inhaler  2  . aspirin 325 MG tablet Take 325 mg by mouth every morning.       Marland Kitchen. CRESTOR 20 MG tablet TAKE 1/2 TABLET (10 MG TOTAL) BY MOUTH AT BEDTIME.  30 tablet  3  . doxycycline (VIBRAMYCIN) 50 MG/5ML SYRP Take 10 mLs (100 mg total) by mouth 2 (two) times daily.  200 mL  0  . fluticasone (FLONASE) 50 MCG/ACT nasal spray Place 2 sprays into both nostrils daily.  16  g  2  . guaiFENesin-codeine 100-10 MG/5ML syrup Take 5 mLs by mouth every 4 (four) hours as needed for cough.  120 mL  0  . vitamin B-12 (CYANOCOBALAMIN) 1000 MCG tablet Take 1,000 mcg by mouth daily.      Marland Kitchen. ezetimibe (ZETIA) 10 MG tablet Take 10 mg by mouth daily.        No current facility-administered medications on file prior to visit.   Allergies  Allergen Reactions  . Contrast Media [Iodinated Diagnostic Agents]     For CT Angio, breaks out in hives  . Flexeril [Cyclobenzaprine Hcl] Other (See Comments)    Muscle spasms  . Lipitor [Atorvastatin Calcium] Other (See Comments)    Pt states she had a bad reaction several years ago when taking this  . Penicillins Other (See Comments)    Unknown reaction many yrs ago  . Sulfa Antibiotics Other (See Comments)    Unknown reaction   ROS:  Denies fevers, chills, shortness of breath, nausea, vomiting, bleeding, bruising, rash, constipation or other complaints, except as per HPI.  PHYSICAL EXAM: BP 120/82  Pulse 72  Temp(Src) 98.2 F (36.8 C) (Oral)  Ht 5' 4.5" (1.638 m)  Wt 153 lb (69.4 kg)  BMI 25.87 kg/m2 Well developed pleasant female in no distress. Speaking easily in full sentences.  Occasional cough--she braces her ribs when coughing.   Lungs are clear bilaterally--good air movement, no wheezes, rales, ronchi She is tender all along the inferior margin of R lower ribs.  No deformity noted. Abdomen is soft, nontender  ASSESSMENT/PLAN:  Right-sided chest wall pain - related to coughing  Drink plenty of fluids. Consider sinus rinses and/or decongestant (phenylephrine or pseudoephedrine) for sinus congestion and pain.  Try heat (vs ice) to the right ribs.  Use Aleve 2 tablets twice daily with food until the pain is better.  You may also use tylenol along with this, if needed for pain.  You may also use the cough syrup as needed--this will help you cough less, so it will heal easier, and it also has codeine which is a pain  reliever (use caution--this might make you sleepy). Consider using guaifenein (in mucinex or robitussin) during the day to keep your secretions thin (sinus and phlegm in the chest) if you aren't using the prescription cough medicine (that has guaifenesin in it--don't use both robitussin and the prescription).   She declines toradol injection  today.

## 2013-08-14 ENCOUNTER — Encounter (HOSPITAL_COMMUNITY): Payer: Self-pay | Admitting: Emergency Medicine

## 2013-08-14 ENCOUNTER — Emergency Department (HOSPITAL_COMMUNITY): Payer: BC Managed Care – PPO

## 2013-08-14 DIAGNOSIS — R109 Unspecified abdominal pain: Secondary | ICD-10-CM | POA: Insufficient documentation

## 2013-08-14 DIAGNOSIS — I720 Aneurysm of carotid artery: Secondary | ICD-10-CM | POA: Insufficient documentation

## 2013-08-14 DIAGNOSIS — G8929 Other chronic pain: Secondary | ICD-10-CM | POA: Insufficient documentation

## 2013-08-14 DIAGNOSIS — Z7982 Long term (current) use of aspirin: Secondary | ICD-10-CM | POA: Insufficient documentation

## 2013-08-14 DIAGNOSIS — Z791 Long term (current) use of non-steroidal anti-inflammatories (NSAID): Secondary | ICD-10-CM | POA: Insufficient documentation

## 2013-08-14 DIAGNOSIS — R0789 Other chest pain: Secondary | ICD-10-CM | POA: Insufficient documentation

## 2013-08-14 DIAGNOSIS — I728 Aneurysm of other specified arteries: Secondary | ICD-10-CM | POA: Insufficient documentation

## 2013-08-14 DIAGNOSIS — IMO0002 Reserved for concepts with insufficient information to code with codable children: Secondary | ICD-10-CM | POA: Insufficient documentation

## 2013-08-14 DIAGNOSIS — Z862 Personal history of diseases of the blood and blood-forming organs and certain disorders involving the immune mechanism: Secondary | ICD-10-CM | POA: Insufficient documentation

## 2013-08-14 DIAGNOSIS — Z880 Allergy status to penicillin: Secondary | ICD-10-CM | POA: Insufficient documentation

## 2013-08-14 DIAGNOSIS — Z79899 Other long term (current) drug therapy: Secondary | ICD-10-CM | POA: Insufficient documentation

## 2013-08-14 DIAGNOSIS — J209 Acute bronchitis, unspecified: Secondary | ICD-10-CM | POA: Insufficient documentation

## 2013-08-14 NOTE — ED Notes (Signed)
The pt is c/o a cough productive for 5 weeks with some rt sided lower chest pain.  She has been seen x2 for the same and diagnosed with bronchitis.  Audible wheezes.  She denies having asthma..  She has been using an inhaler for the bronchitis.  Her sputum is yellow greenish thick.  No chest xray was done the 2 times she was seen

## 2013-08-15 ENCOUNTER — Emergency Department (HOSPITAL_COMMUNITY)
Admission: EM | Admit: 2013-08-15 | Discharge: 2013-08-15 | Disposition: A | Discharge status: Home / Self Care | Payer: BC Managed Care – PPO | Attending: Emergency Medicine | Admitting: Emergency Medicine

## 2013-08-15 DIAGNOSIS — R0781 Pleurodynia: Secondary | ICD-10-CM

## 2013-08-15 DIAGNOSIS — J40 Bronchitis, not specified as acute or chronic: Secondary | ICD-10-CM

## 2013-08-15 LAB — CBC WITH DIFFERENTIAL/PLATELET
Basophils Absolute: 0.1 10*3/uL (ref 0.0–0.1)
Basophils Relative: 1 % (ref 0–1)
Eosinophils Absolute: 0.2 10*3/uL (ref 0.0–0.7)
Eosinophils Relative: 3 % (ref 0–5)
HCT: 38.7 % (ref 36.0–46.0)
Hemoglobin: 12.6 g/dL (ref 12.0–15.0)
Lymphocytes Relative: 37 % (ref 12–46)
Lymphs Abs: 3.1 10*3/uL (ref 0.7–4.0)
MCH: 29.6 pg (ref 26.0–34.0)
MCHC: 32.6 g/dL (ref 30.0–36.0)
MCV: 91.1 fL (ref 78.0–100.0)
Monocytes Absolute: 0.6 10*3/uL (ref 0.1–1.0)
Monocytes Relative: 7 % (ref 3–12)
Neutro Abs: 4.4 10*3/uL (ref 1.7–7.7)
Neutrophils Relative %: 52 % (ref 43–77)
Platelets: 272 10*3/uL (ref 150–400)
RBC: 4.25 MIL/uL (ref 3.87–5.11)
RDW: 12.7 % (ref 11.5–15.5)
WBC: 8.5 10*3/uL (ref 4.0–10.5)

## 2013-08-15 LAB — COMPREHENSIVE METABOLIC PANEL
ALT: 17 U/L (ref 0–35)
AST: 18 U/L (ref 0–37)
Albumin: 3.4 g/dL — ABNORMAL LOW (ref 3.5–5.2)
Alkaline Phosphatase: 59 U/L (ref 39–117)
BUN: 11 mg/dL (ref 6–23)
CO2: 26 mEq/L (ref 19–32)
Calcium: 9.4 mg/dL (ref 8.4–10.5)
Chloride: 101 mEq/L (ref 96–112)
Creatinine, Ser: 0.73 mg/dL (ref 0.50–1.10)
GFR calc Af Amer: >90 mL/min (ref >90)
GFR calc non Af Amer: >90 mL/min (ref >90)
Glucose, Bld: 96 mg/dL (ref 70–99)
Potassium: 4.3 mEq/L (ref 3.7–5.3)
Sodium: 139 mEq/L (ref 137–147)
Total Bilirubin: 0.3 mg/dL (ref 0.3–1.2)
Total Protein: 7.4 g/dL (ref 6.0–8.3)

## 2013-08-15 LAB — TROPONIN I: Troponin I: <0.3 ng/mL (ref <0.30)

## 2013-08-15 MED ORDER — ONDANSETRON HCL 4 MG/2ML IJ SOLN
4.0000 mg | Freq: Once | INTRAMUSCULAR | Status: DC
  Filled 2013-08-15: qty 2

## 2013-08-15 MED ORDER — MORPHINE SULFATE 4 MG/ML IJ SOLN
6.0000 mg | Freq: Once | INTRAMUSCULAR | Status: AC
  Administered 2013-08-15: 6 mg via INTRAVENOUS
  Filled 2013-08-15: qty 2

## 2013-08-15 MED ORDER — HYDROCODONE-ACETAMINOPHEN 5-325 MG PO TABS: 2.0000 | ORAL_TABLET | ORAL | Status: DC | PRN

## 2013-08-15 NOTE — ED Notes (Signed)
Instructions given on Incentive Spirometer, patient demonstrated back

## 2013-08-15 NOTE — Discharge Instructions (Signed)
If you were given medicines take as directed.  If you are on coumadin or contraceptives realize their levels and effectiveness is altered by many different medicines.  If you have any reaction (rash, tongues swelling, other) to the medicines stop taking and see a physician.   Take ibuprofen every 6 hrs, For severe pain take norco or vicodin however realize they have the potential for addiction and it can make you sleepy and has tylenol in it.  No operating machinery while taking.  Please follow up as directed and return to the ER or see a physician for new or worsening symptoms.  Thank you. Filed Vitals:   08/14/13 2255 08/15/13 0024 08/15/13 0149  BP: 144/72 152/87 142/86  Pulse: 77 93 75  Temp: 99 F (37.2 C)    TempSrc: Oral    Resp: 22 20 23   Height: 5\' 4"  (1.626 m)    Weight: 153 lb (69.4 kg)    SpO2: 100% 97% 98%

## 2013-08-15 NOTE — ED Provider Notes (Signed)
CSN: [id number]     Arrival date & time 08/14/13  2246 History   First MD Initiated Contact with Patient 08/15/13 0008     Chief Complaint  Patient presents with  . Cough     (Consider location/radiation/quality/duration/timing/severity/associated sxs/prior Treatment) HPI Comments: 43 year old Debbie [name] with anemia, cholesterol, brain aneurysm history presents with right rib pain for the past week. Gradually worsening occurs with palpation and coughing. Productive cough and patient currently finishing doxycycline for bronchitis/community pneumonia.Patient denies blood clot history, active cancer, recent major trauma or surgery, unilateral leg swelling/ pain, recent long travel, hemoptysis or oral contraceptives. No cardiac history known. No exertional or diaphoresis symptoms. Patient has had significant cough for over 2 weeks now. No sick contacts. Patient does feel her cough is improved since antibiotics. No gallbladder history and no pain with eating.  Patient is a Debbie Debbie [name] presenting with cough. The history is provided by the patient.  Cough Associated symptoms: no chest pain, no chills, no fever, no headaches, no rash and no shortness of breath     Past Medical History  Diagnosis Date  . Anemia   . Family history of heart disease in Debbie [name] family member before age 45     LDL over 300 prior  . 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 History of blood transfusion     x2 due to heavy bleeding and anemia from fibroids  . Edema   . Wears glasses   . Chronic headache     since aneurysm diagnosis  . S/P partial hysterectomy     due to uterine fibroids  . Aneurysm of carotid artery     hx/o - coiling 02/07/2010  . Familial hypercholesteremia     LDL over 300; lipoprotein testing 01/2009  . Vitamin D deficiency   . H/O echocardiogram 03/22/01    mild asymmetric LVH, mild aortic sclerosis, mild mitral regurgitation  . H/O  cardiovascular stress test 12/08/05    fair exercise capacity, no ST segment changes suggestive of ischemia   Past Surgical History  Procedure Laterality Date  . Cesarean section    . Partial hysterectomy    . Tubal ligation    . Tubal reversal    . Hernia repair      epigastric hernia repair age 43, right inguinal hernia as a child  . Esophagogastroduodenoscopy  2011    Dr. Loreta AveMann  . Colonoscopy  2011    Dr. Loreta AveMann, anemia workup  . Aneurysm coiling  02/07/10    stent assisted coiling of right internal carotid artery aneurysm in periophthalmic region  . Abdominal hysterectomy    . Tubal reversal  2008   Family History  Problem Relation Age of Onset  . Heart failure Sister   . Heart disease Sister 44    stents  . Hypertension Sister   . Heart failure Other   . Cancer Other   . Hypertension Mother   . Obesity Mother   . Cancer Mother     breast  . Heart disease Father [id number]    hx/o CABG, stening, died of MI at age 45yo  . Diabetes Father   . Hyperlipidemia Father   . Cancer Maternal Grandmother     breast  . Stroke Maternal Grandfather   . Cancer Paternal Grandfather     prostate cancer   History  Substance Use Topics  . Smoking status: Never Smoker   . Smokeless tobacco: Never Used  .  Alcohol Use: No   OB History   Grav Para Term Preterm Abortions TAB SAB Ect Mult Living                 Review of Systems  Constitutional: Negative for fever and chills.  HENT: Negative for congestion.   Eyes: Negative for visual disturbance.  Respiratory: Positive for cough. Negative for shortness of breath.   Cardiovascular: Negative for chest pain.  Gastrointestinal: Negative for vomiting and abdominal pain.  Genitourinary: Positive for flank pain. Negative for dysuria.  Musculoskeletal: Negative for back pain, neck pain and neck stiffness.  Skin: Negative for rash.  Neurological: Negative for light-headedness and headaches.      Allergies  Contrast media; Flexeril;  Lipitor; Penicillins; and Sulfa antibiotics  Home Medications   Prior to Admission medications   Medication Sig Start Date End Date Taking? Authorizing Provider  albuterol (PROVENTIL HFA;VENTOLIN HFA) 108 (90 BASE) MCG/ACT inhaler Inhale 2 puffs into the lungs every 6 (six) hours as needed for wheezing or shortness of breath. 07/18/13  Yes Rodolph Bong, MD  aspirin 325 MG tablet Take 325 mg by mouth every morning.    Yes Historical Provider, MD  doxycycline (VIBRAMYCIN) 50 MG/5ML SYRP Take 10 mLs (100 mg total) by mouth 2 (two) times daily. 08/05/13  Yes Graylon Good, PA-C  ezetimibe (ZETIA) 10 MG tablet Take 10 mg by mouth daily.  10/09/10  Yes Ronnald Nian, MD  fluticasone [facility]) 50 MCG/ACT nasal spray Place 2 sprays into both nostrils daily. 07/18/13  Yes Rodolph Bong, MD  guaiFENesin-codeine 100-10 MG/5ML syrup Take 5 mLs by mouth every 4 (four) hours as needed for cough. 08/05/13  Yes Adrian Blackwater Baker, PA-C  naproxen sodium (ANAPROX) 220 MG tablet Take 880 mg by mouth as needed.   Yes Historical Provider, MD  rosuvastatin (CRESTOR) 20 MG tablet Take 10 mg by mouth at bedtime.   Yes Historical Provider, MD  vitamin B-12 (CYANOCOBALAMIN) 1000 MCG tablet Take 1,000 mcg by mouth daily.   Yes Historical Provider, MD  HYDROcodone-acetaminophen (NORCO) 5-325 MG per tablet Take 2 tablets by mouth every 4 (four) hours as needed. 08/15/13   Enid Skeens, MD   BP 142/86  Pulse 75  Temp(Src) 99 F (37.2 C) (Oral)  Resp 23  Ht 5\' 4"  (1.626 m)  Wt 153 lb (69.4 kg)  BMI 26.25 kg/m2  SpO2 98% Physical Exam  Nursing note and vitals reviewed. Constitutional: She is oriented to person, place, and time. She appears well-developed and well-nourished.  HENT:  Head: Normocephalic and atraumatic.  Eyes: Conjunctivae are normal. Right eye exhibits no discharge. Left eye exhibits no discharge.  Neck: Normal range of motion. Neck supple. No tracheal deviation present.  Cardiovascular: Normal rate and  regular rhythm.   Pulmonary/Chest: Effort normal and breath sounds normal.  Abdominal: Soft. She exhibits no distension. There is no tenderness. There is no guarding.  Musculoskeletal: She exhibits tenderness (right anterior lower ribs no rash no step-off). She exhibits no edema.  Neurological: She is alert and oriented to person, place, and time.  Skin: Skin is warm. No rash noted.  Psychiatric: She has a normal mood and affect.    ED Course  Procedures (including critical care time) Labs Review Labs Reviewed  COMPREHENSIVE METABOLIC PANEL - Abnormal; Notable for the following:    Albumin 3.4 (*)    All other components within normal limits  CBC WITH DIFFERENTIAL  TROPONIN I    Imaging Review  Dg Chest 2 View  08/14/2013   CLINICAL DATA:  Persistent cough for 5 weeks, chest pain.  EXAM: CHEST  2 VIEW  COMPARISON:  Chest radiograph Jul 21, 2012  FINDINGS: Cardiomediastinal silhouette is unremarkable. The lungs are clear without pleural effusions or focal consolidations. Trachea projects midline and there is no pneumothorax. Soft tissue planes and included osseous structures are non-suspicious.  IMPRESSION: No active cardiopulmonary disease.   Electronically Signed   By: Awilda Metroourtnay  Bloomer   On: 08/14/2013 23:54     EKG Interpretation   Date/Time:  Monday August 15 2013 00:25:50 EDT Ventricular Rate:  70 PR Interval:  185 QRS Duration: 76 QT Interval:  353 QTC Calculation: 381 R Axis:   81 Text Interpretation:  Sinus rhythm Low voltage, precordial leads ST elev,  probable normal early repol pattern Confirmed by Odelia Graciano  MD, Svara Twyman ([id number])  on 08/15/2013 12:31:41 AM Also confirmed by Jodi MourningZAVITZ  MD, Frederick Klinger ([id number])  on  08/15/2013 2:35:15 AM      MDM   Final diagnoses:  Rib pain on right side  Bronchitis   Patient presents with clinically right rib strain from recurrent cough. Patient is well-appearing otherwise. Patient has no blood clot risk factors and has PE RC negative  Patient  improved with pain meds in ER. Chest x-ray reviewed no acute findings, EKG no acute findings reviewed, troponin negative.   Results and differential diagnosis were discussed with the patient/parent/guardian. Close follow up outpatient was discussed, comfortable with the plan.   Medications  ondansetron (ZOFRAN) injection 4 mg (not administered)  morphine 4 MG/ML injection 6 mg (6 mg Intravenous Given 08/15/13 0059)    Filed Vitals:   08/14/13 2255 08/15/13 0024 08/15/13 0149  BP: 144/72 152/87 142/86  Pulse: 77 93 75  Temp: 99 F (37.2 C)    TempSrc: Oral    Resp: 22 20 23   Height: 5\' 4"  (1.626 m)    Weight: 153 lb (69.4 kg)    SpO2: 100% 97% 98%       Enid SkeensJoshua M Gabryella Murfin, MD 08/15/13 [phone]

## 2013-09-02 ENCOUNTER — Telehealth: Payer: Self-pay | Admitting: Medical

## 2013-09-02 NOTE — Telephone Encounter (Signed)
Please call patient.  I was looking back over her chart regarding cholesterol.  I wanted to let her know about an opportunity.    We work with a company that does research studies and medication trials, called [employer].    They are currently doing a study on a medication for cholesterol.    Participants in the study receive compensation, free medication, close follow up and monitoring.   I wanted to let them know about this study because although she is on Lipitor high dose, the last cholesterol numbers we did were ok, but could be better .  See if they have any interest in being part of a study.  If agreeable, pass on her info to [employer] and let me know.  If they desire not to be part of the study, that is fine.   We will continue their current medications and/or treatment as usual.

## 2013-09-09 NOTE — Telephone Encounter (Signed)
Refer to [employer] for the lipid study

## 2013-09-09 NOTE — Telephone Encounter (Signed)
Patient did agree to the [employer] study. CLS Form fax over.

## 2013-11-25 ENCOUNTER — Other Ambulatory Visit: Payer: Self-pay | Admitting: Family Medicine

## 2014-01-22 ENCOUNTER — Other Ambulatory Visit: Payer: Self-pay | Admitting: Family Medicine

## 2014-02-20 ENCOUNTER — Ambulatory Visit
Admission: RE | Admit: 2014-02-20 | Discharge: 2014-02-20 | Disposition: A | Discharge status: Home / Self Care | Payer: BC Managed Care – PPO | Source: Ambulatory Visit | Attending: Medical | Admitting: Medical

## 2014-02-20 ENCOUNTER — Encounter: Payer: Self-pay | Admitting: Medical

## 2014-02-20 ENCOUNTER — Ambulatory Visit (INDEPENDENT_AMBULATORY_CARE_PROVIDER_SITE_OTHER): Payer: BC Managed Care – PPO | Admitting: Medical

## 2014-02-20 VITALS — BP 110/80 | HR 72 | Temp 98.5°F | Resp 16 | Wt 152.0 lb

## 2014-02-20 DIAGNOSIS — M25512 Pain in left shoulder: Secondary | ICD-10-CM

## 2014-02-20 MED ORDER — TRIAMCINOLONE ACETONIDE 0.1 % EX CREA: 1.0000 "application " | TOPICAL_CREAM | Freq: Two times a day (BID) | CUTANEOUS | Status: DC

## 2014-02-20 NOTE — Progress Notes (Signed)
Subjective: Here for left shoulder pain.  Been working out more in the past year.  At one point had soreness doing overhead presses, had to stop doing those.  ended up up over the summer abstaining from the presses.   A month ago did some pushups and this flared up shoulder pain again.  Painful putting on shirts overhead motion.  Gets some pain sleeping too with left shoulder pressure.   With sleep gets tense and has pain in neck and upper back too.  No numbness or tingling in arm.  At first pain was in whole left arm, but primarily pain in shoulder.   Denies specific fall or trauma to left shoulder.   Not taking anything in particular for this, just dealing with the pain.  No swelling.  Does get night time pain regularly.   No other aggravating or relieving factors. No other complaint.  Objective: Gen: wd, wn, nad Skin: unremarkable MSK: left shoulder: nontender to palpation, no deformity, no swelling.  Pain with abduction and flexion over 90 degrees, but deep pain.  Somewhat in pain with most shoulder motion even with arm at her side.  [facility] or biceps origin.  Mild pain with hawkins and apprehension test, mild pain with empty can tests.  No laxity.  There is some pain with labral test. Arm neurovascularly intact.  Right shoulder and arm exam unremarkable Neck: supple, nontender, no thyromegaly, no lymphadenopathy, no mass Back: nontender   Assessment: Encounter Diagnosis  Name Primary?  . Left shoulder pain Yes   Plan: Discussed possible causes of her shoulder pains.  Consider rotator cuff problem, adhesive capsulitis, or other.  Will send for Xray.  Can use ice, stretching, tylenol.  We will call with results and next steps.

## 2014-07-11 ENCOUNTER — Encounter: Payer: Self-pay | Admitting: Medical

## 2014-07-11 ENCOUNTER — Ambulatory Visit (INDEPENDENT_AMBULATORY_CARE_PROVIDER_SITE_OTHER): Payer: BC Managed Care – PPO | Admitting: Medical

## 2014-07-11 VITALS — BP 120/88 | HR 66 | Resp 16 | Wt 160.0 lb

## 2014-07-11 DIAGNOSIS — M545 Low back pain, unspecified: Secondary | ICD-10-CM

## 2014-07-11 DIAGNOSIS — M47816 Spondylosis without myelopathy or radiculopathy, lumbar region: Secondary | ICD-10-CM

## 2014-07-11 MED ORDER — TIZANIDINE HCL 4 MG PO TABS: 4.0000 mg | ORAL_TABLET | Freq: Every evening | ORAL | Status: DC | PRN

## 2014-07-11 NOTE — Progress Notes (Signed)
Subjective: Here for back pain that radiates to hips and down to feet.  Has had prior imaging showing arthritis in spine.   Now has sit down job, and lately having more pains and the pains last longer through the day.   Having pain daily for the past week.  Has taken Aleve during the day, takes the edge off, but at night, the pain awakes her out of her sleep.   Sometimes has pain with walking.   Feels like if she could take the pressure off her legs the pain would improve.  Last few weeks walking for exercise, but no specific stretching.  Does some calisthenics.  No fall, injury, or trauma.  Has had similar back pain in the past, but no recent treatment otherwise.  No other aggravating or relieving factors. No other complaint.  Review of Systems Constitutional: -fever, -chills, -sweats, -unexpected weight change,-fatigue ENT: -runny nose, -ear pain, -sore throat Cardiology:  -chest pain, -palpitations, -edema Respiratory: -cough, -shortness of breath, -wheezing Gastroenterology: -abdominal pain, -nausea, -vomiting, -diarrhea, +constipation, -incontinence Hematology: -bleeding or bruising problems Urology:+sometimes slow stream in the morning, -dysuria, -difficulty urinating, -hematuria, -urinary frequency, -urgency, -incontinence Gyn: +s/p hysterectomy, still has ovaries, denies lower abdominal/pelvic pain Neurology: -headache, -weakness, -tingling, -numbness   Past Medical History  Diagnosis Date  . Anemia   . Family history of heart disease in female family member before age 555     LDL over 300 prior  . 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 History of blood transfusion     x2 due to heavy bleeding and anemia from fibroids  . Edema   . Wears glasses   . Chronic headache     since aneurysm diagnosis  . S/P partial hysterectomy     due to uterine fibroids  . Aneurysm of carotid artery     hx/o - coiling 02/07/2010  . Familial  hypercholesteremia     LDL over 300; lipoprotein testing 01/2009  . Vitamin D deficiency   . H/O echocardiogram 03/22/01    mild asymmetric LVH, mild aortic sclerosis, mild mitral regurgitation  . H/O cardiovascular stress test 12/08/05    fair exercise capacity, no ST segment changes suggestive of ischemia    Objective: BP 120/88 mmHg  Pulse 66  Resp 16  Wt 160 lb (72.576 kg)  General appearance: alert, no distress, WD/WN Abdomen: +bs, soft, non tender, non distended, no masses, no hepatomegaly, no splenomegaly Back: non tender, no scoliosis, normal ROM Musculoskeletal: legs nontender, no swelling, no obvious deformity Extremities: no edema, no cyanosis, no clubbing Pulses: 2+ symmetric, upper and lower extremities, normal cap refill Neurological: normal LE strength, sensation and DTRs.    Assessment: Encounter Diagnoses  Name Primary?  . Facet arthritis of lumbar region Yes  . Bilateral low back pain without sciatica     Plan: Reviewed 2014 xray.   Advised routine exercise such as walking biking or aerobics, DAILY stretching routine, Aleve prn.  Trial of Tizanidine QHS prn.  Discussed risks/benefits and prior issues with flexeril.    Consider Yoga or other structured stretching and calisthenics class 1-2 times per week.   F/u in 46mo, sooner prn.

## 2014-08-10 ENCOUNTER — Encounter: Payer: Self-pay | Admitting: Medical

## 2014-08-10 ENCOUNTER — Ambulatory Visit (INDEPENDENT_AMBULATORY_CARE_PROVIDER_SITE_OTHER): Payer: BC Managed Care – PPO | Admitting: Medical

## 2014-08-10 VITALS — BP 134/80 | HR 78 | Temp 98.0°F | Resp 15 | Wt 161.0 lb

## 2014-08-10 DIAGNOSIS — Z862 Personal history of diseases of the blood and blood-forming organs and certain disorders involving the immune mechanism: Secondary | ICD-10-CM | POA: Diagnosis not present

## 2014-08-10 DIAGNOSIS — M545 Low back pain: Secondary | ICD-10-CM | POA: Diagnosis not present

## 2014-08-10 DIAGNOSIS — M25552 Pain in left hip: Secondary | ICD-10-CM | POA: Diagnosis not present

## 2014-08-10 DIAGNOSIS — M25551 Pain in right hip: Secondary | ICD-10-CM | POA: Diagnosis not present

## 2014-08-10 DIAGNOSIS — E559 Vitamin D deficiency, unspecified: Secondary | ICD-10-CM

## 2014-08-10 DIAGNOSIS — E780 Pure hypercholesterolemia: Secondary | ICD-10-CM | POA: Diagnosis not present

## 2014-08-10 DIAGNOSIS — E7801 Familial hypercholesterolemia: Secondary | ICD-10-CM

## 2014-08-10 LAB — COMPREHENSIVE METABOLIC PANEL
ALT: 15 U/L (ref 0–35)
AST: 14 U/L (ref 0–37)
Albumin: 4.1 g/dL (ref 3.5–5.2)
Alkaline Phosphatase: 46 U/L (ref 39–117)
BUN: 11 mg/dL (ref 6–23)
CO2: 26 mEq/L (ref 19–32)
Calcium: 9.3 mg/dL (ref 8.4–10.5)
Chloride: 105 mEq/L (ref 96–112)
Creat: 0.73 mg/dL (ref 0.50–1.10)
Glucose, Bld: 93 mg/dL (ref 70–99)
Potassium: 4.2 mEq/L (ref 3.5–5.3)
Sodium: 138 mEq/L (ref 135–145)
Total Bilirubin: 0.6 mg/dL (ref 0.2–1.2)
Total Protein: 7.3 g/dL (ref 6.0–8.3)

## 2014-08-10 LAB — LIPID PANEL
Cholesterol: 315 mg/dL — ABNORMAL HIGH (ref 0–200)
HDL: 43 mg/dL — ABNORMAL LOW (ref >=46)
LDL Cholesterol: 255 mg/dL — ABNORMAL HIGH (ref 0–99)
Total CHOL/HDL Ratio: 7.3 Ratio
Triglycerides: 83 mg/dL (ref <150)
VLDL: 17 mg/dL (ref 0–40)

## 2014-08-10 LAB — CBC WITH DIFFERENTIAL/PLATELET
Basophils Absolute: 0.1 10*3/uL (ref 0.0–0.1)
Basophils Relative: 1 % (ref 0–1)
Eosinophils Absolute: 0.1 10*3/uL (ref 0.0–0.7)
Eosinophils Relative: 2 % (ref 0–5)
HCT: 40.8 % (ref 36.0–46.0)
Hemoglobin: 13.7 g/dL (ref 12.0–15.0)
Lymphocytes Relative: 42 % (ref 12–46)
Lymphs Abs: 2.3 10*3/uL (ref 0.7–4.0)
MCH: 29.7 pg (ref 26.0–34.0)
MCHC: 33.6 g/dL (ref 30.0–36.0)
MCV: 88.5 fL (ref 78.0–100.0)
MPV: 11 fL (ref 8.6–12.4)
Monocytes Absolute: 0.3 10*3/uL (ref 0.1–1.0)
Monocytes Relative: 6 % (ref 3–12)
Neutro Abs: 2.7 10*3/uL (ref 1.7–7.7)
Neutrophils Relative %: 49 % (ref 43–77)
Platelets: 304 10*3/uL (ref 150–400)
RBC: 4.61 MIL/uL (ref 3.87–5.11)
RDW: 13.2 % (ref 11.5–15.5)
WBC: 5.5 10*3/uL (ref 4.0–10.5)

## 2014-08-10 LAB — RHEUMATOID FACTOR: Rhuematoid fact SerPl-aCnc: <10 IU/mL (ref <=14)

## 2014-08-10 NOTE — Progress Notes (Signed)
Subjective: Here for recheck on low back pain, hip pain, and pain down to feet.   Today she mainly c/o hip pain bilat of recent.  I saw her a month ago for back pains, joint pains.  Pain comes and goes, since last visit a month ago has been using stretching.   Sometimes in sleep gets the pain with certain positions, has been more careful avoid positions that aggravate pain.  Changed chair at work for better positioning. Been using some resistance bands and walking.   Had one instance of hip aches mopping the floor.   Pains feel deep in the bone.  No morning stiffness daily, but does get this sometimes, particular in knees.  No finger or hand joint swelling.  Main pains is in hips.  The pain is so bad at times that she has thought about using crutches  Here for fasting labs and recheck on cholesterol. Compliant with medication.    Last f/u on aneurysm was over a year ago, and plans to see interventional radiologist this August for recheck on brain aneurysm.   No other complaint.  Review of Systems Constitutional: -fever, -chills, -sweats, -unexpected weight change,-fatigue ENT: -runny nose, -ear pain, -sore throat Cardiology:  -chest pain, -palpitations, -edema Respiratory: -cough, -shortness of breath, -wheezing Gastroenterology: -abdominal pain, -nausea, -vomiting, -diarrhea, +constipation, -incontinence Hematology: -bleeding or bruising problems Urology:+sometimes slow stream in the morning, -dysuria, -difficulty urinating, -hematuria, -urinary frequency, -urgency, -incontinence Gyn: +s/p hysterectomy, still has ovaries, denies lower abdominal/pelvic pain Neurology: -headache, -weakness, -tingling, -numbness   Past Medical History  Diagnosis Date  . Anemia   . Family history of heart disease in female family member before age 81     LDL over 300 prior  . 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 History of blood transfusion     x2 due to  heavy bleeding and anemia from fibroids  . Edema   . Wears glasses   . Chronic headache     since aneurysm diagnosis  . S/P partial hysterectomy     due to uterine fibroids  . Aneurysm of carotid artery     hx/o - coiling 02/07/2010  . Familial hypercholesteremia     LDL over 300; lipoprotein testing 01/2009  . Vitamin D deficiency   . H/O echocardiogram 03/22/01    mild asymmetric LVH, mild aortic sclerosis, mild mitral regurgitation  . H/O cardiovascular stress test 12/08/05    fair exercise capacity, no ST segment changes suggestive of ischemia   Past Surgical History  Procedure Laterality Date  . Cesarean section    . Partial hysterectomy      abdominal due to fibroids, still has ovaries, Dr. Layne Benton  . Tubal ligation    . Tubal reversal    . Hernia repair      epigastric hernia repair age 49, right inguinal hernia as a child  . Esophagogastroduodenoscopy  2011    Dr. Loreta Ave  . Colonoscopy  2011    Dr. Loreta Ave, anemia workup  . Aneurysm coiling  02/07/10    stent assisted coiling of right internal carotid artery aneurysm in periophthalmic region  . Tubal reversal  2008     Objective: BP 134/80 mmHg  Pulse 78  Temp(Src) 98 F (36.7 C) (Oral)  Resp 15  Wt 161 lb (73.029 kg)  General appearance: alert, no distress, WD/WN, lean AA female Heart: RRR, normal S1, s2, no murmurs Lungs clear Abdomen: +bs, soft,  non tender, non distended, no masses, no hepatomegaly, no splenomegaly Back: non tender, no scoliosis, normal ROM Musculoskeletal: legs nontender, no swelling, no obvious deformity, normal ROM Extremities: no edema, no cyanosis, no clubbing Pulses: 2+ symmetric, upper and lower extremities, normal cap refill Neurological: normal LE strength, sensation and DTRs.    Assessment: Encounter Diagnoses  Name Primary?  . Low back pain without sciatica, unspecified back pain laterality Yes  . Hip pain, bilateral   . Vitamin D deficiency   . Familial hypercholesteremia    . History of anemia     Plan: Low back pain, hip pain - exam unremarkable, labs today, consider L spine and bilat hip xrays.  C/t Aleve prn, tizanidine prn QHS, daily stretching and walking  Vitamin D deficiency - labs today  Familial hypercholesterolemia - labs today, compliant with medication  Hx/o anemia - labs today

## 2014-08-11 ENCOUNTER — Other Ambulatory Visit: Payer: Self-pay | Admitting: Medical

## 2014-08-11 ENCOUNTER — Other Ambulatory Visit: Payer: Self-pay | Admitting: Family Medicine

## 2014-08-11 DIAGNOSIS — M25551 Pain in right hip: Secondary | ICD-10-CM

## 2014-08-11 DIAGNOSIS — G8929 Other chronic pain: Secondary | ICD-10-CM

## 2014-08-11 DIAGNOSIS — M549 Dorsalgia, unspecified: Principal | ICD-10-CM

## 2014-08-11 DIAGNOSIS — M25559 Pain in unspecified hip: Secondary | ICD-10-CM

## 2014-08-11 DIAGNOSIS — M25552 Pain in left hip: Principal | ICD-10-CM

## 2014-08-11 LAB — SEDIMENTATION RATE: Sed Rate: 5 mm/hr (ref 0–20)

## 2014-08-11 LAB — ANA: Anti Nuclear Antibody(ANA): NEGATIVE

## 2014-08-11 LAB — VITAMIN D 25 HYDROXY (VIT D DEFICIENCY, FRACTURES): Vit D, 25-Hydroxy: 25 ng/mL — ABNORMAL LOW (ref 30–100)

## 2014-08-11 MED ORDER — ROSUVASTATIN CALCIUM 20 MG PO TABS: 20.0000 mg | ORAL_TABLET | Freq: Every day | ORAL | Status: DC

## 2014-08-11 MED ORDER — EZETIMIBE 10 MG PO TABS: 10.0000 mg | ORAL_TABLET | Freq: Every day | ORAL | Status: DC

## 2014-08-11 MED ORDER — VITAMIN D (ERGOCALCIFEROL) 1.25 MG (50000 UNIT) PO CAPS: 50000.0000 [IU] | ORAL_CAPSULE | ORAL | Status: DC

## 2014-08-23 ENCOUNTER — Other Ambulatory Visit: Payer: Self-pay | Admitting: Medical

## 2014-08-23 ENCOUNTER — Ambulatory Visit
Admission: RE | Admit: 2014-08-23 | Discharge: 2014-08-23 | Disposition: A | Discharge status: Home / Self Care | Payer: BC Managed Care – PPO | Source: Ambulatory Visit | Attending: Medical | Admitting: Medical

## 2014-08-23 DIAGNOSIS — M25559 Pain in unspecified hip: Secondary | ICD-10-CM

## 2014-08-23 DIAGNOSIS — M25551 Pain in right hip: Secondary | ICD-10-CM

## 2014-08-23 DIAGNOSIS — G8929 Other chronic pain: Secondary | ICD-10-CM

## 2014-08-23 DIAGNOSIS — M25552 Pain in left hip: Secondary | ICD-10-CM

## 2014-08-23 DIAGNOSIS — M549 Dorsalgia, unspecified: Principal | ICD-10-CM

## 2014-08-24 ENCOUNTER — Other Ambulatory Visit: Payer: Self-pay

## 2014-08-24 DIAGNOSIS — M549 Dorsalgia, unspecified: Principal | ICD-10-CM

## 2014-08-24 DIAGNOSIS — G8929 Other chronic pain: Secondary | ICD-10-CM

## 2014-09-06 ENCOUNTER — Telehealth: Payer: Self-pay | Admitting: Internal Medicine

## 2014-09-06 NOTE — Telephone Encounter (Signed)
[facility] [phone] on 08/24/14

## 2014-10-03 ENCOUNTER — Encounter (HOSPITAL_COMMUNITY): Payer: Self-pay | Admitting: Emergency Medicine

## 2014-10-03 ENCOUNTER — Emergency Department (HOSPITAL_COMMUNITY)
Admission: EM | Admit: 2014-10-03 | Discharge: 2014-10-04 | Disposition: A | Discharge status: Home / Self Care | Payer: BC Managed Care – PPO | Attending: Emergency Medicine | Admitting: Emergency Medicine

## 2014-10-03 DIAGNOSIS — Z86018 Personal history of other benign neoplasm: Secondary | ICD-10-CM | POA: Diagnosis not present

## 2014-10-03 DIAGNOSIS — Z862 Personal history of diseases of the blood and blood-forming organs and certain disorders involving the immune mechanism: Secondary | ICD-10-CM | POA: Diagnosis not present

## 2014-10-03 DIAGNOSIS — Z7982 Long term (current) use of aspirin: Secondary | ICD-10-CM | POA: Diagnosis not present

## 2014-10-03 DIAGNOSIS — Z79899 Other long term (current) drug therapy: Secondary | ICD-10-CM | POA: Diagnosis not present

## 2014-10-03 DIAGNOSIS — Z90711 Acquired absence of uterus with remaining cervical stump: Secondary | ICD-10-CM | POA: Diagnosis not present

## 2014-10-03 DIAGNOSIS — Z8679 Personal history of other diseases of the circulatory system: Secondary | ICD-10-CM | POA: Diagnosis not present

## 2014-10-03 DIAGNOSIS — E780 Pure hypercholesterolemia: Secondary | ICD-10-CM | POA: Insufficient documentation

## 2014-10-03 DIAGNOSIS — E559 Vitamin D deficiency, unspecified: Secondary | ICD-10-CM | POA: Diagnosis not present

## 2014-10-03 DIAGNOSIS — R197 Diarrhea, unspecified: Secondary | ICD-10-CM | POA: Insufficient documentation

## 2014-10-03 DIAGNOSIS — G8929 Other chronic pain: Secondary | ICD-10-CM | POA: Insufficient documentation

## 2014-10-03 DIAGNOSIS — Z880 Allergy status to penicillin: Secondary | ICD-10-CM | POA: Diagnosis not present

## 2014-10-03 DIAGNOSIS — R42 Dizziness and giddiness: Secondary | ICD-10-CM | POA: Diagnosis not present

## 2014-10-03 LAB — CBC
HCT: 42.3 % (ref 36.0–46.0)
Hemoglobin: 13.7 g/dL (ref 12.0–15.0)
MCH: 29.6 pg (ref 26.0–34.0)
MCHC: 32.4 g/dL (ref 30.0–36.0)
MCV: 91.4 fL (ref 78.0–100.0)
Platelets: 317 10*3/uL (ref 150–400)
RBC: 4.63 MIL/uL (ref 3.87–5.11)
RDW: 12.4 % (ref 11.5–15.5)
WBC: 6.6 10*3/uL (ref 4.0–10.5)

## 2014-10-03 LAB — BASIC METABOLIC PANEL
Anion gap: 7 (ref 5–15)
BUN: 12 mg/dL (ref 6–20)
CO2: 26 mmol/L (ref 22–32)
Calcium: 9.6 mg/dL (ref 8.9–10.3)
Chloride: 106 mmol/L (ref 101–111)
Creatinine, Ser: 0.85 mg/dL (ref 0.44–1.00)
GFR calc Af Amer: >60 mL/min (ref >60)
GFR calc non Af Amer: >60 mL/min (ref >60)
Glucose, Bld: 102 mg/dL — ABNORMAL HIGH (ref 65–99)
Potassium: 3.9 mmol/L (ref 3.5–5.1)
Sodium: 139 mmol/L (ref 135–145)

## 2014-10-03 LAB — URINALYSIS, ROUTINE W REFLEX MICROSCOPIC
Bilirubin Urine: NEGATIVE
Glucose, UA: NEGATIVE mg/dL
Ketones, ur: NEGATIVE mg/dL
Leukocytes, UA: NEGATIVE
Nitrite: NEGATIVE
Protein, ur: NEGATIVE mg/dL
Specific Gravity, Urine: 1.019 (ref 1.005–1.030)
Urobilinogen, UA: 0.2 mg/dL (ref 0.0–1.0)
pH: 7 (ref 5.0–8.0)

## 2014-10-03 LAB — URINE MICROSCOPIC-ADD ON

## 2014-10-03 LAB — CBG MONITORING, ED: Glucose-Capillary: 87 mg/dL (ref 65–99)

## 2014-10-03 NOTE — ED Notes (Signed)
Pt stated the glue on the EKG leads burn her skin

## 2014-10-03 NOTE — ED Notes (Signed)
Pt c/o lightheadedness, dizziness, "tension" in neck, formed loose stools x2 days.

## 2014-10-03 NOTE — ED Notes (Signed)
Pt reports recent contact with meningitis patient. Masked placed on pt in waiting room.

## 2014-10-04 LAB — DIFFERENTIAL
Basophils Absolute: 0 10*3/uL (ref 0.0–0.1)
Basophils Relative: 0 % (ref 0–1)
Eosinophils Absolute: 0.1 10*3/uL (ref 0.0–0.7)
Eosinophils Relative: 1 % (ref 0–5)
Lymphocytes Relative: 29 % (ref 12–46)
Lymphs Abs: 2 10*3/uL (ref 0.7–4.0)
Monocytes Absolute: 0.5 10*3/uL (ref 0.1–1.0)
Monocytes Relative: 8 % (ref 3–12)
Neutro Abs: 4.2 10*3/uL (ref 1.7–7.7)
Neutrophils Relative %: 62 % (ref 43–77)

## 2014-10-04 MED ORDER — SODIUM CHLORIDE 0.9 % IV BOLUS (SEPSIS)
1000.0000 mL | Freq: Once | INTRAVENOUS | Status: AC
  Administered 2014-10-04: 1000 mL via INTRAVENOUS

## 2014-10-04 NOTE — ED Provider Notes (Signed)
CSN: [id number]     Arrival date & time 10/03/14  1937 History   First MD Initiated Contact with Patient 10/04/14 0014     Chief Complaint  Patient presents with  . Dizziness     (Consider location/radiation/quality/duration/timing/severity/associated sxs/prior Treatment) Patient is a 44 y.o. female presenting with dizziness. The history is provided by the patient.  Dizziness Quality:  Lightheadedness Severity:  Mild Onset quality:  Gradual Duration:  8 hours Timing:  Constant Progression:  Unchanged Chronicity:  New Context: not when bending over, not with eye movement and not when standing up   Relieved by:  None tried Worsened by:  Nothing Ineffective treatments:  None tried Associated symptoms: diarrhea   Associated symptoms: no shortness of breath, no syncope, no tinnitus, no vision changes, no vomiting and no weakness     Past Medical History  Diagnosis Date  . Anemia   . Family history of heart disease in female family member before age 53     LDL over 300 prior  . 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 History of blood transfusion     x2 due to heavy bleeding and anemia from fibroids  . Edema   . Wears glasses   . Chronic headache     since aneurysm diagnosis  . S/P partial hysterectomy     due to uterine fibroids  . Aneurysm of carotid artery     hx/o - coiling 02/07/2010  . Familial hypercholesteremia     LDL over 300; lipoprotein testing 01/2009  . Vitamin D deficiency   . H/O echocardiogram 03/22/01    mild asymmetric LVH, mild aortic sclerosis, mild mitral regurgitation  . H/O cardiovascular stress test 12/08/05    fair exercise capacity, no ST segment changes suggestive of ischemia   Past Surgical History  Procedure Laterality Date  . Cesarean section    . Partial hysterectomy      abdominal due to fibroids, still has ovaries, Dr. Layne Benton  . Tubal ligation    . Tubal reversal    . Hernia  repair      epigastric hernia repair age 28, right inguinal hernia as a child  . Esophagogastroduodenoscopy  2011    Dr. Loreta Ave  . Colonoscopy  2011    Dr. Loreta Ave, anemia workup  . Aneurysm coiling  02/07/10    stent assisted coiling of right internal carotid artery aneurysm in periophthalmic region  . Tubal reversal  2008   Family History  Problem Relation Age of Onset  . Heart failure Sister   . Heart disease Sister 44    stents  . Hypertension Sister   . Heart failure Other   . Cancer Other   . Hypertension Mother   . Obesity Mother   . Cancer Mother     breast  . Heart disease Father 47    hx/o CABG, stening, died of MI at age 24yo  . Diabetes Father   . Hyperlipidemia Father   . Cancer Maternal Grandmother     breast  . Stroke Maternal Grandfather   . Cancer Paternal Grandfather     prostate cancer   Social History  Substance Use Topics  . Smoking status: Never Smoker   . Smokeless tobacco: Never Used  . Alcohol Use: No   OB History    No data available     Review of Systems  HENT: Negative for tinnitus.   Respiratory: Negative for shortness  of breath.   Cardiovascular: Negative for syncope.  Gastrointestinal: Positive for diarrhea. Negative for vomiting.  Neurological: Positive for dizziness. Negative for weakness.      Allergies  Contrast media; Flexeril; Lipitor; Penicillins; and Sulfa antibiotics  Home Medications   Prior to Admission medications   Medication Sig Start Date End Date Taking? Authorizing Provider  aspirin 325 MG tablet Take 325 mg by mouth every morning.    Yes Historical Provider, MD  ezetimibe (ZETIA) 10 MG tablet Take 1 tablet (10 mg total) by mouth daily. 08/11/14  Yes Kermit Balo Tysinger, PA-C  Multiple Vitamin (MULTIVITAMIN WITH MINERALS) TABS tablet Take 1 tablet by mouth daily.   Yes Historical Provider, MD  rosuvastatin (CRESTOR) 20 MG tablet Take 1 tablet (20 mg total) by mouth daily. 08/11/14  Yes Kermit Balo Tysinger, PA-C  vitamin  B-12 (CYANOCOBALAMIN) 1000 MCG tablet Take 1,000 mcg by mouth daily.   Yes Historical Provider, MD  tiZANidine (ZANAFLEX) 4 MG tablet Take 1 tablet (4 mg total) by mouth at bedtime as needed for muscle spasms. Patient not taking: Reported on 08/10/2014 07/11/14   Kermit Balo Tysinger, PA-C  Vitamin D, Ergocalciferol, (DRISDOL) 50000 UNITS CAPS capsule Take 1 capsule (50,000 Units total) by mouth every 7 (seven) days. Patient not taking: Reported on 10/04/2014 08/11/14   Kermit Balo Tysinger, PA-C   BP 151/74 mmHg  Pulse 65  Temp(Src) 98.3 F (36.8 C) (Oral)  Resp 18  SpO2 100% Physical Exam  Constitutional: She is oriented to person, place, and time. She appears well-developed and well-nourished. No distress.  HENT:  Head: Normocephalic and atraumatic.  Left Ear: External ear normal.  Mouth/Throat: Oropharynx is clear and moist.  Eyes: Pupils are equal, round, and reactive to light.  Neck: Normal range of motion.  Cardiovascular: Normal rate and regular rhythm.   Pulmonary/Chest: Effort normal and breath sounds normal.  Abdominal: Soft. Bowel sounds are normal.  Musculoskeletal: Normal range of motion. She exhibits no edema or tenderness.  Lymphadenopathy:    She has no cervical adenopathy.  Neurological: She is alert and oriented to person, place, and time.  Skin: Skin is warm and dry. No rash noted.  Nursing note and vitals reviewed.   ED Course  Procedures (including critical care time) Labs Review Labs Reviewed  BASIC METABOLIC PANEL - Abnormal; Notable for the following:    Glucose, Bld 102 (*)    All other components within normal limits  URINALYSIS, ROUTINE W REFLEX MICROSCOPIC (NOT AT [facility]) - Abnormal; Notable for the following:    Hgb urine dipstick TRACE (*)    All other components within normal limits  URINE MICROSCOPIC-ADD ON - Abnormal; Notable for the following:    Squamous Epithelial / LPF FEW (*)    All other components within normal limits  CBC  DIFFERENTIAL  CBG  MONITORING, ED    Imaging Review No results found.   EKG Interpretation None     No reported recent viral illness, exposure to Viral Meningitis 7/28 , no neck pain, no meningeal signs will try hydration and reassess.  After IV fluids.  Patient is no longer symptomatic.  She's been ambulated without any symptoms MDM   Final diagnoses:  Dizzy         Earley Favor, NP 10/04/14 0022  Earley Favor, NP 10/04/14 0025  Earley Favor, NP 10/04/14 0234  Paula Libra, MD 10/04/14 [id number]

## 2014-10-04 NOTE — Discharge Instructions (Signed)
Dizziness  Dizziness means you feel unsteady or lightheaded. You might feel like you are going to pass out (faint). HOME CARE   Drink enough fluids to keep your pee (urine) clear or pale yellow.  Take your medicines exactly as told by your doctor. If you take blood pressure medicine, always stand up slowly from the lying or sitting position. Hold on to something to steady yourself.  If you need to stand in one place for a long time, move your legs often. Tighten and relax your leg muscles.  Have someone stay with you until you feel okay.  Do not drive or use heavy machinery if you feel dizzy.  Do not drink alcohol. GET HELP RIGHT AWAY IF:   You feel dizzy or lightheaded and it gets worse.  You feel sick to your stomach (nauseous), or you throw up (vomit).  You have trouble talking or walking.  You feel weak or have trouble using your arms, hands, or legs.  You cannot think clearly or have trouble forming sentences.  You have chest pain, belly (abdominal) pain, sweating, or you are short of breath.  Your vision changes.  You are bleeding.  You have problems from your medicine that seem to be getting worse. MAKE SURE YOU:   Understand these instructions.  Will watch your condition.  Will get help right away if you are not doing well or get worse. Document Released: 01/30/2011 Document Revised: 05/05/2011 Document Reviewed: 01/30/2011 [facility] Patient Information 2015 [place], [place]. This information is not intended to replace advice given to you by your health care provider. Make sure you discuss any questions you have with your health care provider. Today's examination shows that you had normal lab values normal.  You were given IV fluids with resolution of your symptoms. Please call your primary care physician to schedule an appointment for follow-up

## 2014-10-27 ENCOUNTER — Telehealth: Payer: Self-pay | Admitting: Medical

## 2014-10-27 NOTE — Telephone Encounter (Signed)
Called pharmacy reran Rx and went thru #90 for $4, Pt informed of this and also to take a whole tablet of Crestor.

## 2014-10-27 NOTE — Telephone Encounter (Signed)
Needs to be on a whole tablet of Crestor.  Make sure we have recheck for fasting lipid again after she has been back on the medication 6 wk.  If not tolerating this or too expensive, we will have to try different medication.

## 2014-10-27 NOTE — Telephone Encounter (Signed)
Pt wants to verify if she is supposed to be on a 1/2 tablet of Crestor or a whole? Also states co-pay went from $40 to $100 for Crestor.  Walked her thru Crestor site and she was advised to print off discount card online and fax to pharmacy

## 2015-02-27 ENCOUNTER — Telehealth: Payer: Self-pay | Admitting: Medical

## 2015-02-27 MED ORDER — ROSUVASTATIN CALCIUM 20 MG PO TABS: 20.0000 mg | ORAL_TABLET | Freq: Every day | ORAL | Status: DC

## 2015-02-27 NOTE — Telephone Encounter (Signed)
Pt needs ref[facility] to [facility] wants it to be 90 days for better cost.  Pt is out of her meds

## 2015-02-27 NOTE — Telephone Encounter (Signed)
Done

## 2015-02-28 ENCOUNTER — Other Ambulatory Visit: Payer: Self-pay | Admitting: Medical

## 2015-02-28 MED ORDER — ROSUVASTATIN CALCIUM 20 MG PO TABS: 20.0000 mg | ORAL_TABLET | Freq: Every day | ORAL | Status: DC

## 2015-02-28 NOTE — Telephone Encounter (Signed)
Meds sent but also set up for fasting physical visit

## 2015-02-28 NOTE — Telephone Encounter (Signed)
Left detailed on pts voicemail.

## 2015-03-01 ENCOUNTER — Telehealth: Payer: Self-pay | Admitting: Medical

## 2015-03-01 NOTE — Telephone Encounter (Signed)
Pt called and has been out of her crestor for almost 2 weeks and her insurance needs a prior aut so i sent that to [facility], the insurance will only cover the generic but she states that she cant take the generic she has to take the crestor, so she will have to be out of the crestor for another week and she says that she feels achy and sometimes dizzy, and just weird feeling, and wants to know if she goes without her medicine if she will be ok with out it, or if there is something else she can take in the mean time. Please advise pt on what to do. Pt can be reached at [phone]

## 2015-03-01 NOTE — Telephone Encounter (Signed)
Tried to initiate P.A. Crestor but insurance no longer active.  [facility] [place] [facility] & called [place] & they said not active

## 2015-03-01 NOTE — Telephone Encounter (Signed)
Pt called needing a prior auth for her crestor, her insurance wouldn't fill it cause there is a generic out there, said she has to use the crestor. Has been without her medicine for 2 weeks i let her know that it would be about a week before this could get done. The insurance would only fill the generic one. Please call her at [phone] when done [facility] uses [id number] - [place], [place] - [address]

## 2015-03-02 MED ORDER — PRAVASTATIN SODIUM 80 MG PO TABS: 80.0000 mg | ORAL_TABLET | Freq: Every day | ORAL | Status: DC

## 2015-03-02 NOTE — Telephone Encounter (Signed)
Pt states that she [place] still [place]. She has a new card the id number [id number]. She is the Stage managerpolicy holder. Wants to see if she can get samples since the last time she was without this medication her numbers got extremely high

## 2015-03-02 NOTE — Telephone Encounter (Signed)
No samples available so may want to consider changing to different medication

## 2015-03-02 NOTE — Telephone Encounter (Signed)
LMTCB

## 2015-03-02 NOTE — Telephone Encounter (Signed)
She has a physical appt in feb and pt aware

## 2015-03-02 NOTE — Telephone Encounter (Signed)
Forgot to put in there made pt CPE appt for Feb 21st

## 2015-03-02 NOTE — Telephone Encounter (Signed)
Have her make physical appt, and I sent Pravachol to start when she runs out of Crestor

## 2015-03-02 NOTE — Telephone Encounter (Signed)
Pt stated that she is willing to try something else but lipitor did not work for her so she is willing to try something else

## 2015-03-02 NOTE — Telephone Encounter (Signed)
Call patient and find out about what insurance she has and what trouble you are having getting meds approved.  She is also due for CPX appt

## 2015-03-04 NOTE — Telephone Encounter (Signed)
Debbie [name] switched pt to Pravastatin

## 2015-03-05 NOTE — Telephone Encounter (Signed)
Debbie [name] switched medication

## 2015-03-15 ENCOUNTER — Other Ambulatory Visit: Payer: Self-pay | Admitting: Medical

## 2015-04-06 ENCOUNTER — Encounter: Payer: Self-pay | Admitting: Medical

## 2015-04-06 ENCOUNTER — Ambulatory Visit (INDEPENDENT_AMBULATORY_CARE_PROVIDER_SITE_OTHER): Payer: BC Managed Care – PPO | Admitting: Medical

## 2015-04-06 VITALS — BP 140/100 | HR 74 | Wt 159.0 lb

## 2015-04-06 DIAGNOSIS — Z8679 Personal history of other diseases of the circulatory system: Secondary | ICD-10-CM

## 2015-04-06 DIAGNOSIS — R200 Anesthesia of skin: Secondary | ICD-10-CM | POA: Diagnosis not present

## 2015-04-06 DIAGNOSIS — R030 Elevated blood-pressure reading, without diagnosis of hypertension: Secondary | ICD-10-CM | POA: Diagnosis not present

## 2015-04-06 DIAGNOSIS — R079 Chest pain, unspecified: Secondary | ICD-10-CM

## 2015-04-06 DIAGNOSIS — IMO0001 Reserved for inherently not codable concepts without codable children: Secondary | ICD-10-CM

## 2015-04-06 LAB — CBC WITH DIFFERENTIAL/PLATELET
Basophils Absolute: 0.1 10*3/uL (ref 0.0–0.1)
Basophils Relative: 1 % (ref 0–1)
Eosinophils Absolute: 0.1 10*3/uL (ref 0.0–0.7)
Eosinophils Relative: 1 % (ref 0–5)
HCT: 42.9 % (ref 36.0–46.0)
Hemoglobin: 14.1 g/dL (ref 12.0–15.0)
Lymphocytes Relative: 39 % (ref 12–46)
Lymphs Abs: 2.9 10*3/uL (ref 0.7–4.0)
MCH: 29.4 pg (ref 26.0–34.0)
MCHC: 32.9 g/dL (ref 30.0–36.0)
MCV: 89.6 fL (ref 78.0–100.0)
MPV: 10.9 fL (ref 8.6–12.4)
Monocytes Absolute: 0.5 10*3/uL (ref 0.1–1.0)
Monocytes Relative: 7 % (ref 3–12)
Neutro Abs: 3.9 10*3/uL (ref 1.7–7.7)
Neutrophils Relative %: 52 % (ref 43–77)
Platelets: 319 10*3/uL (ref 150–400)
RBC: 4.79 MIL/uL (ref 3.87–5.11)
RDW: 13.2 % (ref 11.5–15.5)
WBC: 7.5 10*3/uL (ref 4.0–10.5)

## 2015-04-06 LAB — COMPREHENSIVE METABOLIC PANEL
ALT: 16 U/L (ref 6–29)
AST: 14 U/L (ref 10–35)
Albumin: 4.4 g/dL (ref 3.6–5.1)
Alkaline Phosphatase: 41 U/L (ref 33–115)
BUN: 11 mg/dL (ref 7–25)
CO2: 24 mmol/L (ref 20–31)
Calcium: 9.5 mg/dL (ref 8.6–10.2)
Chloride: 103 mmol/L (ref 98–110)
Creat: 0.78 mg/dL (ref 0.50–1.10)
Glucose, Bld: 88 mg/dL (ref 65–99)
Potassium: 4 mmol/L (ref 3.5–5.3)
Sodium: 139 mmol/L (ref 135–146)
Total Bilirubin: 1.1 mg/dL (ref 0.2–1.2)
Total Protein: 7.2 g/dL (ref 6.1–8.1)

## 2015-04-06 LAB — TSH: TSH: 0.95 mIU/L

## 2015-04-06 LAB — VITAMIN B12: Vitamin B-12: 547 pg/mL (ref 200–1100)

## 2015-04-06 NOTE — Progress Notes (Signed)
Chief Complaint  Patient presents with  . elevated bp    pinching in her chest while exercising. editing the maching to where she wasnt doing inclines. starting to feel "woozy" when got off the bike. sharp pinhing pain didnt go away. 140/90 was her bp today. pointer and middle finger on lt hand goes numb. feeling SOB like she is "panting" but states it hasnot interfered with any daily activities.    Here for concerns.   Just started back doing some weight bearing and aerobic exercise.   Has been doing fine the last few weeks but yesterday had a few brief seconds episodes of pinching sensation in mid to right chest.   Each time was just for a few seconds but a pinch like pain.   She continued to exercise, the pains would recur but did not worsen in severity.  She was already somewhat dyspneic and sweaty from exercise.  This only happened yesterday, no other recent chest pain.    She notes tingling and numbness intermittent of left 2nd and 3rd fingers that last for minutes to hours but infrequently and temporary. Denies other numbness, tingling, weakness.    When she felt bad yesterday, friend checked BP and it was 140/90. She hasn't been checking BP in general.   Past Medical History  Diagnosis Date  . Anemia   . Family history of heart disease in female family member before age 46     LDL over 300 prior  . Vertebral artery aneurysm (HCC)     left, unruptured, no surgery correction, monitoring since 2011  . History of blood transfusion     x2 due to heavy bleeding and anemia from fibroids  . Edema   . Wears glasses   . Chronic headache     since aneurysm diagnosis  . S/P partial hysterectomy     due to uterine fibroids  . Aneurysm of carotid artery (HCC)     hx/o - coiling 02/07/2010  . Familial hypercholesteremia     LDL over 300; lipoprotein testing 01/2009  . Vitamin D deficiency   . H/O echocardiogram 03/22/01    mild asymmetric LVH, mild aortic sclerosis, mild mitral regurgitation   . H/O cardiovascular stress test 12/08/05    fair exercise capacity, no ST segment changes suggestive of ischemia   ROS as in subjective  Objective: BP 140/100 mmHg  Pulse 74  Wt 159 lb (72.122 kg)  BP Readings from Last 3 Encounters:  04/06/15 140/100  10/04/14 151/74  08/10/14 134/80    General appearance: alert, no distress, WD/WN Oral cavity: MMM, no lesions Neck: supple, no lymphadenopathy, no thyromegaly, no masses Heart: RRR, normal S1, S2, no murmurs Lungs: CTA bilaterally, no wheezes, rhonchi, or rales Abdomen: +bs, soft, non tender, non distended, no masses, no hepatomegaly, no splenomegaly Extremities: no edema, no cyanosis, no clubbing Pulses: 2+ symmetric, upper and lower extremities, normal cap refill Neurological:-tinels and phalens, alert, oriented x 3, CN2-12 intact, strength normal upper extremities and lower extremities, sensation normal throughout, DTRs 2+ throughout, no cerebellar signs, gait normal Psychiatric: normal affect, behavior normal, pleasant    Adult ECG Report  Indication: chest pain  Rate: 67 bpm  Rhythm: normal sinus rhythm and sinus arrhythmia  QRS Axis: 47 degrees  PR Interval:  QRS Duration: 84ms  QTc:  Conduction Disturbances: none  Other Abnormalities: possible early repolarization  Patient's cardiac risk factors are: hx/o aneurysm.  EKG comparison: 07/2013  Narrative Interpretation: no change  Assessment: Encounter Diagnoses  Name Primary?  . Elevated blood pressure Yes  . Finger numbness   . Chest pain, unspecified chest pain type   . History of aneurysm      Plan: reviewed EKG, concerns, symptoms.    Labs today to further evaluate and rule out certain causes such as electrolyte disturbance and anemia.   paresthesia etiology could be CTS vs other.  consider repeat brain imaging with hx/o aneurysm pending labs.    Consider baseline stress test.   F/u pending labs.

## 2015-04-10 ENCOUNTER — Telehealth: Payer: Self-pay

## 2015-04-10 DIAGNOSIS — Z8679 Personal history of other diseases of the circulatory system: Secondary | ICD-10-CM

## 2015-04-10 NOTE — Telephone Encounter (Signed)
-----   Message from Jac Canavan, PA-C sent at 04/10/2015  7:40 AM EST ----- That is fine, set up for MRI brain (ask them about whether she needs contrast or not.   Make sure they are aware of prior aneurysm procedure/coil procedure and she will only do MRI at this point)

## 2015-04-10 NOTE — Telephone Encounter (Signed)
ZOXW#[id number] Dates: 04/10/15-05/09/15 GI is calling to scheduling

## 2015-04-17 ENCOUNTER — Encounter: Payer: Self-pay | Admitting: Medical

## 2015-04-17 ENCOUNTER — Ambulatory Visit (INDEPENDENT_AMBULATORY_CARE_PROVIDER_SITE_OTHER): Payer: BC Managed Care – PPO | Admitting: Medical

## 2015-04-17 VITALS — BP 130/88 | HR 71 | Ht 64.25 in | Wt 163.0 lb

## 2015-04-17 DIAGNOSIS — Z803 Family history of malignant neoplasm of breast: Secondary | ICD-10-CM | POA: Diagnosis not present

## 2015-04-17 DIAGNOSIS — Z1211 Encounter for screening for malignant neoplasm of colon: Secondary | ICD-10-CM | POA: Insufficient documentation

## 2015-04-17 DIAGNOSIS — Z8249 Family history of ischemic heart disease and other diseases of the circulatory system: Secondary | ICD-10-CM | POA: Diagnosis not present

## 2015-04-17 DIAGNOSIS — E782 Mixed hyperlipidemia: Secondary | ICD-10-CM | POA: Diagnosis not present

## 2015-04-17 DIAGNOSIS — K5900 Constipation, unspecified: Secondary | ICD-10-CM | POA: Diagnosis not present

## 2015-04-17 DIAGNOSIS — L918 Other hypertrophic disorders of the skin: Secondary | ICD-10-CM | POA: Diagnosis not present

## 2015-04-17 DIAGNOSIS — E559 Vitamin D deficiency, unspecified: Secondary | ICD-10-CM | POA: Diagnosis not present

## 2015-04-17 DIAGNOSIS — Z8679 Personal history of other diseases of the circulatory system: Secondary | ICD-10-CM

## 2015-04-17 DIAGNOSIS — Z9889 Other specified postprocedural states: Secondary | ICD-10-CM | POA: Diagnosis not present

## 2015-04-17 DIAGNOSIS — E7801 Familial hypercholesterolemia: Secondary | ICD-10-CM | POA: Diagnosis not present

## 2015-04-17 DIAGNOSIS — Z1239 Encounter for other screening for malignant neoplasm of breast: Secondary | ICD-10-CM

## 2015-04-17 DIAGNOSIS — Z0000 Encounter for general adult medical examination without abnormal findings: Secondary | ICD-10-CM

## 2015-04-17 DIAGNOSIS — Z8489 Family history of other specified conditions: Secondary | ICD-10-CM

## 2015-04-17 DIAGNOSIS — Z2821 Immunization not carried out because of patient refusal: Secondary | ICD-10-CM | POA: Diagnosis not present

## 2015-04-17 DIAGNOSIS — E78019 Familial hypercholesterolemia, unspecified: Secondary | ICD-10-CM | POA: Insufficient documentation

## 2015-04-17 DIAGNOSIS — R198 Other specified symptoms and signs involving the digestive system and abdomen: Secondary | ICD-10-CM

## 2015-04-17 DIAGNOSIS — R10819 Abdominal tenderness, unspecified site: Secondary | ICD-10-CM | POA: Insufficient documentation

## 2015-04-17 LAB — POCT URINALYSIS DIPSTICK
Bilirubin, UA: NEGATIVE
Blood, UA: NEGATIVE
Glucose, UA: NEGATIVE
Ketones, UA: NEGATIVE
Leukocytes, UA: NEGATIVE
Nitrite, UA: NEGATIVE
Protein, UA: NEGATIVE
Spec Grav, UA: >=1.03
Urobilinogen, UA: NEGATIVE
pH, UA: 6

## 2015-04-17 MED ORDER — VITAMIN D (ERGOCALCIFEROL) 1.25 MG (50000 UNIT) PO CAPS: 50000.0000 [IU] | ORAL_CAPSULE | ORAL | Status: DC

## 2015-04-17 NOTE — Addendum Note (Signed)
Addended by: Kieth Brightly on: 04/17/2015 10:15 AM   Modules accepted: Orders, SmartSet

## 2015-04-17 NOTE — Progress Notes (Signed)
Subjective:   HPI  Debbie [name] is a 45 y.o. female who presents for a complete physical.  She has forms that need completed for foster care as she is foster parent.    She has hx/o aneurysm, one was coiled, and still has another one that is not progressed at this point. She sees interventional radiologist periodically to recheck with arteriogram.  She has never seen neurosurgery, neurology , vascular surgery.  She does report hx/o cardiac evaluation 2003 and 2007.   She has updated MRI brain tomorrow.   Wants to avoid MRA for now, scared to do this again.     Gets intermittent mid abdominal pain from time to time.  Wonders about hernia, has had abdominal hernia repaired in the past.    Reviewed their medical, surgical, family, social, medication, and allergy history and updated chart as appropriate.  Past Medical History  Diagnosis Date  . Family history of heart disease in female family member before age 27     LDL over 300 prior  . Vertebral artery aneurysm (HCC)     left, unruptured, no surgery correction, monitoring since 2011  . History of blood transfusion     x2 due to heavy bleeding and anemia from fibroids  . Edema   . Wears glasses   . Chronic headache     since aneurysm diagnosis. mostly related to constipation as of 2017  . S/P partial hysterectomy     due to uterine fibroids  . Aneurysm of carotid artery (HCC)     hx/o - coiling 02/07/2010  . Familial hypercholesteremia     LDL over 300; lipoprotein testing 01/2009  . Vitamin D deficiency   . H/O echocardiogram 03/22/01    mild asymmetric LVH, mild aortic sclerosis, mild mitral regurgitation  . H/O cardiovascular stress test 12/08/05    fair exercise capacity, no ST segment changes suggestive of ischemia  . Anemia     history of   Past Surgical History  Procedure Laterality Date  . Cesarean section    . Partial hysterectomy      abdominal due to fibroids, still has ovaries, Dr. Layne Benton  . Tubal ligation    .  Esophagogastroduodenoscopy  2011    Dr. Loreta Ave  . Colonoscopy  2011    Dr. Loreta Ave, anemia workup  . Aneurysm coiling  02/07/10    stent assisted coiling of right internal carotid artery aneurysm in periophthalmic region  . Tubal reversal  2008  . Hernia repair      epigastric hernia repair age 9, right inguinal hernia as a child     Family History  Problem Relation Age of Onset  . Heart failure Sister   . Heart disease Sister 44    stents  . Hypertension Sister   . Aneurysm Sister   . Heart failure Other   . Cancer Other   . Hypertension Mother   . Obesity Mother   . Cancer Mother     breast  . Heart disease Father 47    hx/o CABG, stening, died of MI at age 65yo  . Diabetes Father   . Hyperlipidemia Father   . Cancer Maternal Grandmother     breast  . Stroke Maternal Grandfather   . Cancer Paternal Grandfather     prostate cancer    Social History   Social History  . Marital Status: Married    Spouse Name: N/A  . Number of Children: N/A  . Years of Education: N/A  Occupational History  . TEACHER, biology high school    Social History Main Topics  . Smoking status: Never Smoker   . Smokeless tobacco: Never Used  . Alcohol Use: No  . Drug Use: No  . Sexual Activity: Not on file   Other Topics Concern  . Not on file   Social History Narrative   Married, 2 biological children, 6 foster children, walking, calisthenics, stationary bike at least 5 days per week.   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 aspirin 325 MG tablet Take 325 mg by mouth every morning.     . pravastatin (PRAVACHOL) 80 MG tablet Take 1 tablet (80 mg total) by mouth daily. 30 tablet 1  . ZETIA 10 MG tablet TAKE 1 TABLET (10 MG TOTAL) BY MOUTH DAILY. 90 tablet 0  . Multiple Vitamin (MULTIVITAMIN WITH MINERALS) TABS tablet Take 1 tablet by mouth daily. Reported on 04/17/2015    . vitamin  B-12 (CYANOCOBALAMIN) 1000 MCG tablet Take 1,000 mcg by mouth daily. Reported on 04/17/2015     No current facility-administered medications on file prior to visit.    Allergies  Allergen Reactions  . Contrast Media [Iodinated Diagnostic Agents]     For CT Angio, breaks out in hives  . Lipitor [Atorvastatin Calcium] Other (See Comments)    Pt states she had a bad reaction several years ago when taking this  . Paxil [Paroxetine Hcl]     Tongue swelling  . Penicillins Other (See Comments)    Unknown reaction many yrs ago  . Sulfa Antibiotics Other (See Comments)    Unknown reaction   Review of Systems Constitutional: -fever, -chills, -sweats, -unexpected weight change, -anorexia, -fatigue Allergy: -sneezing, -itching, -congestion Dermatology: denies changing moles, rash, lumps, new worrisome lesions ENT: -runny nose, -ear pain, -sore throat, -hoarseness, -sinus pain, -teeth pain, -tinnitus, -hearing loss, -epistaxis Cardiology:  -chest pain, -palpitations, -edema, -orthopnea, -paroxysmal nocturnal dyspnea Respiratory: -cough, -shortness of breath, -dyspnea on exertion, -wheezing, -hemoptysis Gastroenterology: +abdominal pain, -nausea, -vomiting, -diarrhea, -constipation, -blood in stool, -changes in bowel movement, -dysphagia Hematology: -bleeding or bruising problems Musculoskeletal: -arthralgias, -myalgias, -joint swelling, -back pain, -neck pain, -cramping, -gait changes Ophthalmology: -vision changes, -eye redness, -itching, -discharge Urology: -dysuria, -difficulty urinating, -hematuria, -urinary frequency, -urgency, incontinence Neurology:-+headache, -weakness, -tingling, -numbness, -speech abnormality, -memory loss, -falls, -dizziness Psychology:  -depressed mood, -agitation, -sleep problems      Objective:   Physical Exam  BP 130/88 mmHg  Pulse 71  Ht 5' 4.25" (1.632 m)  Wt 163 lb (73.936 kg)  BMI 27.76 kg/m2  General appearance: alert, no distress, WD/WN, black  female, pleasant Skin: no worrisome lesions, scattered macules throughout, several flat benign round macules of breast, left chest with pedunculated 2mm skin tag and similar pedunculated skin tag right upper medial arm.   HEENT: normocephalic, conjunctiva/corneas normal, sclerae anicteric, PERRLA, EOMi, nares patent, no discharge or erythema, pharynx normal Oral cavity: MMM, tongue normal, teeth in good repair Neck: supple, no lymphadenopathy, no thyromegaly, no masses, normal ROM, no bruits Chest: non tender, normal shape and expansion Heart: RRR, normal S1, S2, no murmurs Lungs: CTA bilaterally, no wheezes, rhonchi, or rales Abdomen: +bs, soft, upper mid abdomen surgical scar, left mid abdominal surgical scar, low transverse surgical scar, mild right mid abdominal tenderness, somewhat of a pulsatile aorta, non tender, non distended, no masses, no hepatomegaly, no splenomegaly, no bruits Back: non tender, normal  ROM, no scoliosis Musculoskeletal: upper extremities non tender, no obvious deformity, normal ROM throughout, lower extremities non tender, no obvious deformity, normal ROM throughout Extremities: no edema, no cyanosis, no clubbing Pulses: 2+ symmetric, upper and lower extremities, normal cap refill Neurological: alert, oriented x 3, CN2-12 intact, strength normal upper extremities and lower extremities, sensation normal throughout, DTRs 2+ throughout, no cerebellar signs, gait normal Psychiatric: normal affect, behavior normal, pleasant  Breast: nontender, no masses or lumps, no skin changes, no nipple discharge or inversion, no axillary lymphadenopathy Gyn: Normal external genitalia without lesions, vagina with normal mucosa, s/p hysterectomy, no abnormal vaginal discharge.  Adnexa not enlarged, nontender, no masses.  Exam chaperoned by nurse. Rectal: deferred    Assessment and Plan :    Encounter Diagnoses  Name Primary?  . Encounter for health maintenance examination in adult  Yes  . History of aneurysm   . History of cerebral aneurysm repair   . Family history of heart disease in female family member before age 77   . Family history of aneurysm   . Mixed dyslipidemia   . Familial hypercholesterolemia   . Vitamin D deficiency   . Family history of breast cancer   . Influenza vaccination declined   . Skin tag   . Constipation, unspecified constipation type   . Abdominal tenderness   . Pulsatile abdomen     Physical exam - discussed healthy lifestyle, diet, exercise, preventative care, vaccinations, and addressed their concerns.  Handout given. See your eye doctor yearly for routine vision care. See your dentist yearly for routine dental care including hygiene visits twice yearly. Advised mammogram every 1-2 years, but yearly at 45yo or now if desired.   She is s/p hysterectomy for fibroids.   Aneurysm - hx/o repair.   She has brain MRI tomorrow Mixed dyslipidemia and familial hypercholesterolemia - c/t pravastatin and zetia, lab today but consider changing to something like Vytorin.  Doesn't like to take Pravachol due to size of tablet and nasty taste Vit D deficiency - c/t weekly vit D.   Wants to avoid daily Vit D OTC due to constipation.  tolerates the weekly ok constipation - c/t good fiber and water intake.  No major issues for now abdominal discomfort, pulsatile abdominal mass - will send for abdominal ultrasound Skin tag - discussed findings.  She gives consent for procedure.  Discussed risk/benefits of procedure.   Cleaned and prepped left chest and right upper arm in usual sterile fashion. Used ethyl acetate spray for local anesthesia.   Cut off both small skin tags with scissors.

## 2015-04-18 ENCOUNTER — Ambulatory Visit
Admission: RE | Admit: 2015-04-18 | Discharge: 2015-04-18 | Disposition: A | Discharge status: Home / Self Care | Payer: BC Managed Care – PPO | Source: Ambulatory Visit | Attending: Medical | Admitting: Medical

## 2015-04-18 ENCOUNTER — Other Ambulatory Visit: Payer: Self-pay | Admitting: Medical

## 2015-04-18 LAB — LIPID PANEL
Cholesterol: 219 mg/dL — ABNORMAL HIGH (ref 125–200)
HDL: 46 mg/dL (ref >=46)
LDL Cholesterol: 162 mg/dL — ABNORMAL HIGH (ref <130)
Total CHOL/HDL Ratio: 4.8 Ratio (ref <=5.0)
Triglycerides: 55 mg/dL (ref <150)
VLDL: 11 mg/dL (ref <30)

## 2015-04-18 MED ORDER — ROSUVASTATIN CALCIUM 40 MG PO TABS: 40.0000 mg | ORAL_TABLET | Freq: Every day | ORAL | Status: DC

## 2015-04-18 MED ORDER — EZETIMIBE 10 MG PO TABS: ORAL_TABLET | ORAL | Status: DC

## 2015-04-19 ENCOUNTER — Telehealth: Payer: Self-pay | Admitting: Medical

## 2015-04-19 NOTE — Telephone Encounter (Signed)
Advised her of labs.  She wanted to know about the next test steps please call her at [phone]

## 2015-04-24 ENCOUNTER — Telehealth: Payer: Self-pay | Admitting: Medical

## 2015-04-24 NOTE — Telephone Encounter (Signed)
Pt returned call to [place]. Also wants to know if her ultrasound has been scheduled

## 2015-04-26 ENCOUNTER — Telehealth: Payer: Self-pay

## 2015-04-26 DIAGNOSIS — R1900 Intra-abdominal and pelvic swelling, mass and lump, unspecified site: Secondary | ICD-10-CM

## 2015-04-26 NOTE — Telephone Encounter (Signed)
U/s scheduled 05/07/15 at 830 am

## 2015-04-26 NOTE — Telephone Encounter (Signed)
-----   Message from Jac Canavan, PA-C sent at 04/26/2015 12:07 PM EST ----- Last visit I wanted to order abdominal ultrasound.  I don't see it ordered.  Please set this up.  Its for abdominal discomfort but also for pulsatile abdominal mass and hx/o brain aneurysm.   See if we need to order plain [place] vs other abdominal ultrasound/aortic ultrasound.

## 2015-05-04 ENCOUNTER — Telehealth: Payer: Self-pay | Admitting: Medical

## 2015-05-04 NOTE — Telephone Encounter (Signed)
P.A. Approved per Cover My Meds, called pharmacy with discount info & brand went thru for #30 for $3.  Called & left message for pt

## 2015-05-04 NOTE — Telephone Encounter (Signed)
Called pharmacy & generic went thru for $48 for 90 days, advised was supposed to be brand.  They reran and requires P.A., she is to manually fax me the denial and in the meantime I went ahead and printed a discount card from online if we can get it approved.  Completed form online for brand Crestor.

## 2015-05-07 ENCOUNTER — Ambulatory Visit
Admission: RE | Admit: 2015-05-07 | Discharge: 2015-05-07 | Disposition: A | Discharge status: Home / Self Care | Payer: BC Managed Care – PPO | Source: Ambulatory Visit | Attending: Medical | Admitting: Medical

## 2015-06-05 ENCOUNTER — Encounter: Payer: Self-pay | Admitting: Family Medicine

## 2015-06-05 ENCOUNTER — Ambulatory Visit (INDEPENDENT_AMBULATORY_CARE_PROVIDER_SITE_OTHER): Payer: BC Managed Care – PPO | Admitting: Family Medicine

## 2015-06-05 VITALS — BP 120/80 | HR 60 | Temp 98.1°F | Wt 163.8 lb

## 2015-06-05 DIAGNOSIS — R2231 Localized swelling, mass and lump, right upper limb: Secondary | ICD-10-CM

## 2015-06-05 DIAGNOSIS — Z1239 Encounter for other screening for malignant neoplasm of breast: Secondary | ICD-10-CM | POA: Diagnosis not present

## 2015-06-05 DIAGNOSIS — Z803 Family history of malignant neoplasm of breast: Secondary | ICD-10-CM

## 2015-06-05 LAB — HM MAMMOGRAPHY

## 2015-06-05 NOTE — Progress Notes (Signed)
   Subjective:    Patient ID: Debbie [name], female    DOB: July 26, 1970, 45 y.o.   MRN: [id number]  HPI Chief Complaint  Patient presents with  . lump    lump under armpit. been there about a month.. more noticable   She is here with complaints of a history of a lump to right axilla area since February. Noticed it while showering. Denies skin changes, area is non tender.  She denies fever, chills, unexplained weight loss, fatigue, night sweats, nausea, vomiting, diarrhea, abdominal pain or back pain.  States she does monthly self breast exams and no other areas of concern.  Last mammogram was May 2015. This was done at [facility].   She had a partial hysterectomy 2011 for heavy bleeding.  Mother and MGM with breast cancer in their 4460s. Grandmother died from complications related to breast cancer.   She also reports having an area to right side of abdomen that occasionally feels like she is developing a hernia. States if she leans up against something in that area, she can feel some discomfort, otherwise she has no issues. This has been ongoing for Approximately one year or longer. She reports history of epigastric hernia repair and inguinal hernia repair.    Review of Systems Pertinent positives and negatives in the history of present illness.     Objective:   Physical Exam BP 120/80 mmHg  Pulse 60  Temp(Src) 98.1 F (36.7 C) (Oral)  Wt 163 lb 12.8 oz (74.299 kg)  .Alert and oriented and in no acute distress, right axilla with a soft, non tender bulge, no skin changes. Abdominal exam shows soft, non distended, non tender abdomen with normal bowel sounds. No obvious hernia present. No rebound, guarding or referred pain present.       Assessment & Plan:  Axillary mass, right  Screening for breast cancer  Family history of breast cancer in mother  Discussed that the area to right axilla appears to be fatty tissue but due to significant family history of breast cancer, we will refer  for further evaluation and diagnostic ultrasound. She is also due to screening mammogram and this will be ordered as well. She is going today at 1:30pm to [facility]. Martie LeeSabrina, CMA, scheduled this via telephone and patient will take written order with her.  Discussed that area to abdomen does not appear to be a hernia and since it has been present and unchanged for about 1 year, I recommend watchful waiting. If she notices any additional symptoms she will follow up with PCP.

## 2015-06-12 ENCOUNTER — Telehealth: Payer: Self-pay | Admitting: Medical

## 2015-06-12 NOTE — Telephone Encounter (Signed)
Left detailed on pt's VM 

## 2015-06-12 NOTE — Telephone Encounter (Signed)
Ultrasound and mammogram show no worrisome findings and it is recommended to have repeat mammogram in 1 year.

## 2015-06-13 ENCOUNTER — Encounter: Payer: Self-pay | Admitting: *Deleted

## 2015-06-21 ENCOUNTER — Encounter: Payer: Self-pay | Admitting: Medical

## 2015-07-20 ENCOUNTER — Encounter: Payer: Self-pay | Admitting: Medical

## 2015-07-20 ENCOUNTER — Ambulatory Visit (INDEPENDENT_AMBULATORY_CARE_PROVIDER_SITE_OTHER): Payer: BC Managed Care – PPO | Admitting: Medical

## 2015-07-20 VITALS — BP 120/86 | HR 78 | Temp 99.2°F | Wt 163.0 lb

## 2015-07-20 DIAGNOSIS — T148 Other injury of unspecified body region: Secondary | ICD-10-CM

## 2015-07-20 DIAGNOSIS — W57XXXA Bitten or stung by nonvenomous insect and other nonvenomous arthropods, initial encounter: Secondary | ICD-10-CM | POA: Diagnosis not present

## 2015-07-20 NOTE — Progress Notes (Signed)
Subjective: Chief Complaint  Patient presents with  . tick bite    not sure how long it was on her. not engourged. on her lt hip. left a bump and itching. said that it has turned into a white head like bump. feels like something is in it. pulled the tick off a month ago.    Felt a tick on left hip a month ago while in shower.  Husband pulled tick out as best he could.   It was a tiny tick but engorged.  Wasn't sure he got the head.   Left a red bump raised up like a pimple.  Had a whitehead appearence for a week. Its very itchy   The white head bump got smaller, and the bump ultimately turned darker color, is still itchy and still a bump there.   No fever, no rash, no joints, but had fullness in head few days ago.  Using witch hazel on the area.  Not sure how long it was attached, but got it off as soon as she noticed it there.  No other aggravating or relieving factors. No other complaint.  Objective: BP 120/86 mmHg  Pulse 78  Temp(Src) 99.2 F (37.3 C) (Tympanic)  Wt 163 lb (73.936 kg)  Gen: wd, wn, nad Left hip with localized 3mm raised brown lesion.  No induration, fluctuance, warmth, erythema or foreign body   Assessment: Encounter Diagnosis  Name Primary?  . Tick bite Yes     Plan: discussed finding.  Reassured no obvious signs/symptoms of lyme disease or RMSF.   advised oral benadryl then next few nights, and topical Cortaid OTC for 1-2 weeks.  Advised that the skin lesion with gradually resolve.

## 2015-11-16 ENCOUNTER — Encounter: Payer: Self-pay | Admitting: Medical

## 2015-11-16 ENCOUNTER — Ambulatory Visit (INDEPENDENT_AMBULATORY_CARE_PROVIDER_SITE_OTHER): Payer: BC Managed Care – PPO | Admitting: Medical

## 2015-11-16 VITALS — BP 160/90 | HR 74 | Ht 64.25 in | Wt 168.0 lb

## 2015-11-16 DIAGNOSIS — Z8679 Personal history of other diseases of the circulatory system: Secondary | ICD-10-CM | POA: Diagnosis not present

## 2015-11-16 DIAGNOSIS — E78019 Familial hypercholesterolemia, unspecified: Secondary | ICD-10-CM

## 2015-11-16 DIAGNOSIS — E7801 Familial hypercholesterolemia: Secondary | ICD-10-CM | POA: Diagnosis not present

## 2015-11-16 DIAGNOSIS — Z8249 Family history of ischemic heart disease and other diseases of the circulatory system: Secondary | ICD-10-CM

## 2015-11-16 DIAGNOSIS — E782 Mixed hyperlipidemia: Secondary | ICD-10-CM

## 2015-11-16 DIAGNOSIS — R7301 Impaired fasting glucose: Secondary | ICD-10-CM

## 2015-11-16 DIAGNOSIS — Z833 Family history of diabetes mellitus: Secondary | ICD-10-CM

## 2015-11-16 NOTE — Progress Notes (Signed)
Subjective: Chief Complaint  Patient presents with  . A1C    Insurance physical came back A1C was 6.2   Here for concerns about prediabetes.   She applied for life insurance recently and had labs.   HgbA1c was 6.2%.  Lipids were much improved compared to 06/2015 readings begin back on Crestor + Zetia.  She reports that she exercises several days per week, eats healthy. Denies polyuria, polydipsia, blurred vision or other symptoms.  No other aggravating or relieving factors. No other complaint.  Past Medical History:  Diagnosis Date  . Anemia    history of  . Aneurysm of carotid artery (HCC)    hx/o - coiling 02/07/2010  . Chronic headache    since aneurysm diagnosis. mostly related to constipation as of 2017  . Edema   . Familial hypercholesteremia    LDL over 300; lipoprotein testing 01/2009  . Family history of heart disease in female family member before age 47    LDL over 300 prior  . H/O cardiovascular stress test 12/08/05   fair exercise capacity, no ST segment changes suggestive of ischemia  . H/O echocardiogram 03/22/01   mild asymmetric LVH, mild aortic sclerosis, mild mitral regurgitation  . History of blood transfusion    x2 due to heavy bleeding and anemia from fibroids  . S/P partial hysterectomy    due to uterine fibroids  . 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 Vitamin D deficiency   . Wears glasses    Current Outpatient Prescriptions on File Prior to Visit  Medication Sig Dispense Refill  . aspirin 325 MG tablet Take 325 mg by mouth every morning.     . ezetimibe (ZETIA) 10 MG tablet TAKE 1 TABLET (10 MG TOTAL) BY MOUTH DAILY. 90 tablet 3  . Multiple Vitamin (MULTIVITAMIN WITH MINERALS) TABS tablet Take 1 tablet by mouth daily. Reported on 04/17/2015    . rosuvastatin (CRESTOR) 40 MG tablet Take 1 tablet (40 mg total) by mouth daily. 90 tablet 3  . Vitamin D, Ergocalciferol, (DRISDOL) 50000 units CAPS  capsule Take 1 capsule (50,000 Units total) by mouth every 7 (seven) days. 4.5 capsule 11   No current facility-administered medications on file prior to visit.    Family History  Problem Relation Age of Onset  . Heart failure Sister   . Heart disease Sister 44    stents  . Hypertension Sister   . Aneurysm Sister   . Hypertension Mother   . Obesity Mother   . Cancer Mother     breast  . Heart disease Father [id number]    hx/o CABG, stening, died of MI at age 47yo  . Diabetes Father   . Hyperlipidemia Father   . Heart failure Other   . Cancer Other   . Cancer Maternal Grandmother     breast  . Stroke Maternal Grandfather   . Cancer Paternal Grandfather     prostate cancer    ROS as in subjective    Objective: BP (!) 160/90 (BP Location: Right Arm, Patient Position: Sitting, Cuff Size: Normal)   Pulse 74   Ht 5' 4.25" (1.632 m)   Wt 168 lb (76.2 kg)   SpO2 98%   BMI 28.61 kg/m   Gen: wd wn nad otherwise not examined    Assessment: Encounter Diagnoses  Name Primary?  . Impaired fasting blood sugar Yes  . Mixed dyslipidemia   . Familial hypercholesterolemia   .  History of aneurysm   . Family history of heart disease in female family member before age 50   . Family history of diabetes mellitus     Plan: Reviewed the recent labs.  counseled on criteria for diabetes, counseled on diet, exercise, working on losing weight, working to get to normal BMI, using target heart rate of 60-70% of maximum/ heart rate or 115-120 bpm with exercise.  Discussed also having goal of lipids < 100 or preferably <70.   Consider repatha in 1mo if not at goal.   C/t zetia, crestor.  F/u 1mo fasting.

## 2015-11-16 NOTE — Patient Instructions (Signed)
Recommendations:  Try and get exercise every day, but at least 4-5 days per week  Aim for 115-120 bpm heart rate during exercise  Try and exercise 40-60 minutes such as walking, ellpicital, or bike, or hiking, etc  Try and cut out 500 calories per day in the diet  Call insurer to check coverage for Saxenda weight loss medication  Check BP every now and then, and give me some updated blood pressure numbers in a month  Call back with weight in 1-2 months

## 2015-12-24 ENCOUNTER — Ambulatory Visit (INDEPENDENT_AMBULATORY_CARE_PROVIDER_SITE_OTHER): Payer: BC Managed Care – PPO | Admitting: Medical

## 2015-12-24 ENCOUNTER — Encounter: Payer: Self-pay | Admitting: Medical

## 2015-12-24 VITALS — BP 110/80 | HR 82 | Temp 98.4°F | Wt 168.2 lb

## 2015-12-24 DIAGNOSIS — R05 Cough: Secondary | ICD-10-CM | POA: Diagnosis not present

## 2015-12-24 DIAGNOSIS — B351 Tinea unguium: Secondary | ICD-10-CM

## 2015-12-24 DIAGNOSIS — J019 Acute sinusitis, unspecified: Secondary | ICD-10-CM | POA: Diagnosis not present

## 2015-12-24 DIAGNOSIS — R059 Cough, unspecified: Secondary | ICD-10-CM

## 2015-12-24 MED ORDER — HYDROCODONE-HOMATROPINE 5-1.5 MG/5ML PO SYRP: 5.0000 mL | ORAL_SOLUTION | Freq: Three times a day (TID) | ORAL | 0 refills | Status: DC | PRN

## 2015-12-24 MED ORDER — AMOXICILLIN 400 MG/5ML PO SUSR: ORAL | 0 refills | Status: DC

## 2015-12-24 MED ORDER — TERBINAFINE HCL 250 MG PO TABS: 250.0000 mg | ORAL_TABLET | Freq: Every day | ORAL | 1 refills | Status: DC

## 2015-12-24 NOTE — Progress Notes (Signed)
Subjective:  Debbie [name] is a 45 y.o. female who presents for respiratory illness.   She reports 6 day hx/o cough, congestion, sore throat on fire, lots of drainage, mucous is now more thick and set in, thick mucous from sinuses and coughing same thick mucous up now.  Had low grade fever a few days.   Using liquid mucinex but it made her insides feel jittery.  2 coworkers had similar a week ago.  Nonsmoker.  No wheezing, no vomiting, no diarrhea, no rash.  No SOB. No other aggravating or relieving factors.  Wants to restart toenail fungus medication.  She used Lamisil a few years ago and it cleared up fungus completley but she has recurrence in right great toenail.     Past Medical History:  Diagnosis Date  . Anemia    history of  . Aneurysm of carotid artery (HCC)    hx/o - coiling 02/07/2010  . Chronic headache    since aneurysm diagnosis. mostly related to constipation as of 2017  . Edema   . Familial hypercholesteremia    LDL over 300; lipoprotein testing 01/2009  . Family history of heart disease in female family member before age 47    LDL over 300 prior  . H/O cardiovascular stress test 12/08/05   fair exercise capacity, no ST segment changes suggestive of ischemia  . H/O echocardiogram 03/22/01   mild asymmetric LVH, mild aortic sclerosis, mild mitral regurgitation  . History of blood transfusion    x2 due to heavy bleeding and anemia from fibroids  . S/P partial hysterectomy    due to uterine fibroids  . 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 Vitamin D deficiency   . Wears glasses     ROS as in subjective   Objective: BP 110/80   Pulse 82   Temp 98.4 F (36.9 C)   Wt 168 lb 3.2 oz (76.3 kg)   SpO2 98%   BMI 28.65 kg/m   General appearance: Alert, WD/WN, no distress, mildly ill appearing                             Skin: warm, no rash                           Head: no sinus tenderness                Eyes: conjunctiva normal, corneas clear, PERRLA                            Ears: pearly TMs, external ear canals normal                          Nose: septum midline, turbinates swollen, with erythema and clear discharge             Mouth/throat: MMM, tongue normal, mild pharyngeal erythema                           Neck: supple, no adenopathy, no thyromegaly, non tender                          Heart: RRR, normal S1, S2, no murmurs  Lungs: CTA bilaterally, no wheezes, rales, or rhonchi     Assessment  Encounter Diagnoses  Name Primary?  . Acute non-recurrent sinusitis, unspecified location Yes  . Cough   . Onychomycosis       Plan: Medications prescribed today:  Amoxicillin antibiotic, hycodan for cough QHS.    Patient Instructions  Recommendations  Rest  Drink plenty of water  Use the amoxicillin liquid twice daily for 10 days  Use the cough syrup Hycodan at bedtime as this may cause drowsiness  During the day, use either Mucinex DM or Coricidin HBP for cough/congestion  Avoid sudafed/pseudoephedrine or Phenylephrine as these are decongestants that can make you feel jittery  If not much improved by end of the week, then let me know.  onychomycosis - recurrence.  Begin Lamisil, f/u 6 wk for labs and recheck.

## 2015-12-24 NOTE — Patient Instructions (Signed)
Recommendations  Rest  Drink plenty of water  Use the amoxicillin liquid twice daily for 10 days  Use the cough syrup Hycodan at bedtime as this may cause drowsiness  During the day, use either Mucinex DM or Coricidin HBP for cough/congestion  Avoid sudafed/pseudoephedrine or Phenylephrine as these are decongestants that can make you feel jittery  If not much improved by end of the week, then let me know.

## 2016-01-25 ENCOUNTER — Telehealth: Payer: Self-pay | Admitting: Family Medicine

## 2016-01-25 NOTE — Telephone Encounter (Signed)
Called and left message that she needs to make a appt for a flu shot

## 2016-01-31 ENCOUNTER — Encounter: Payer: Self-pay | Admitting: Medical

## 2016-02-26 ENCOUNTER — Other Ambulatory Visit: Payer: Self-pay | Admitting: Medical

## 2016-03-12 IMAGING — CR DG HIP (WITH OR WITHOUT PELVIS) 5+V BILAT
5 series · 5 of 5 positions shown · non-contrast
Comparison: Lumbar spine series of today's date and February 04, 2013.

CLINICAL DATA: Ten years of chronic low back and bilateral hip pain
without history of trauma symptoms in the hips worsened when the
patient rotates the lumbar spine.

EXAM:
BILATERAL HIP (WITH PELVIS) 5-6 VIEWS

[w pelvis upright]
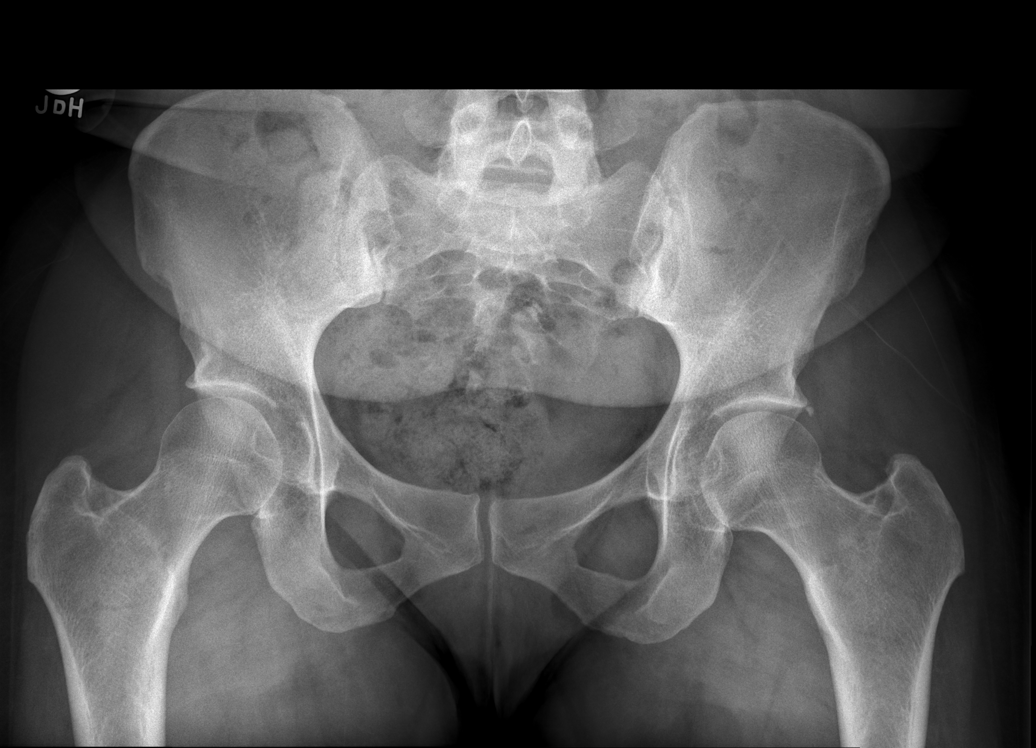

[w hip ap left]
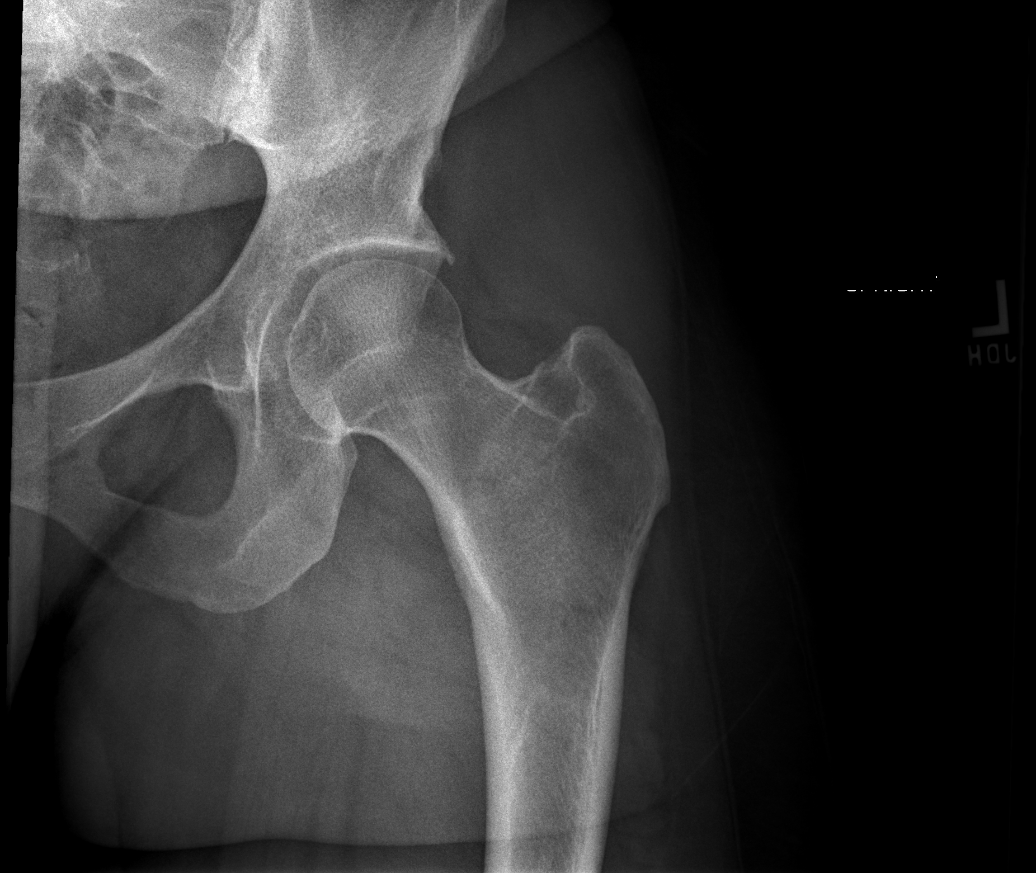

[w hip lat left]
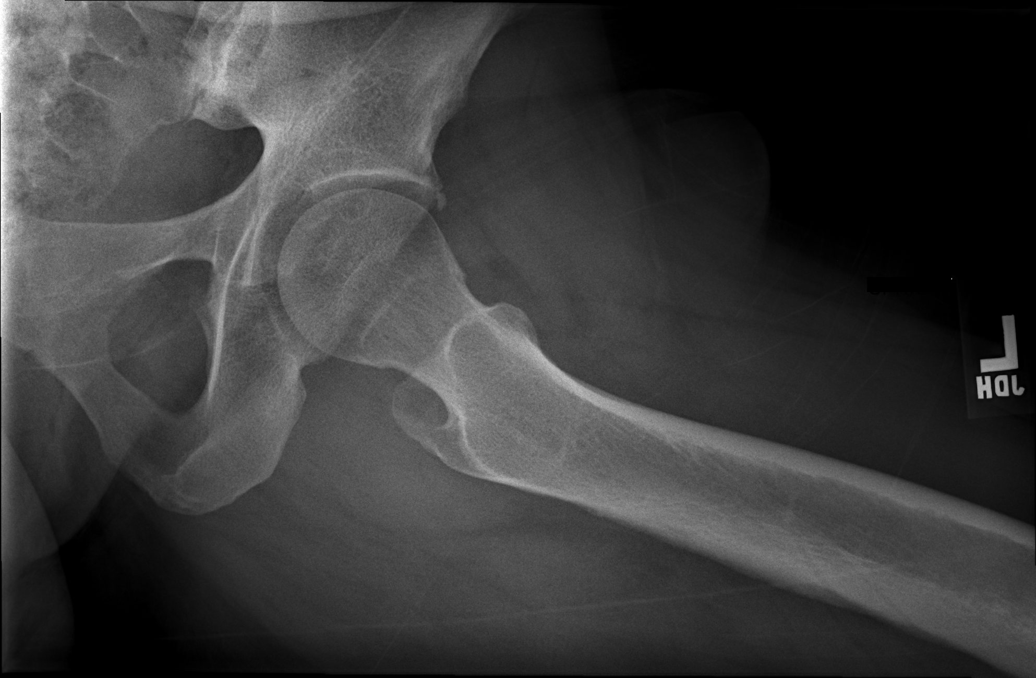

[w hip ap right]
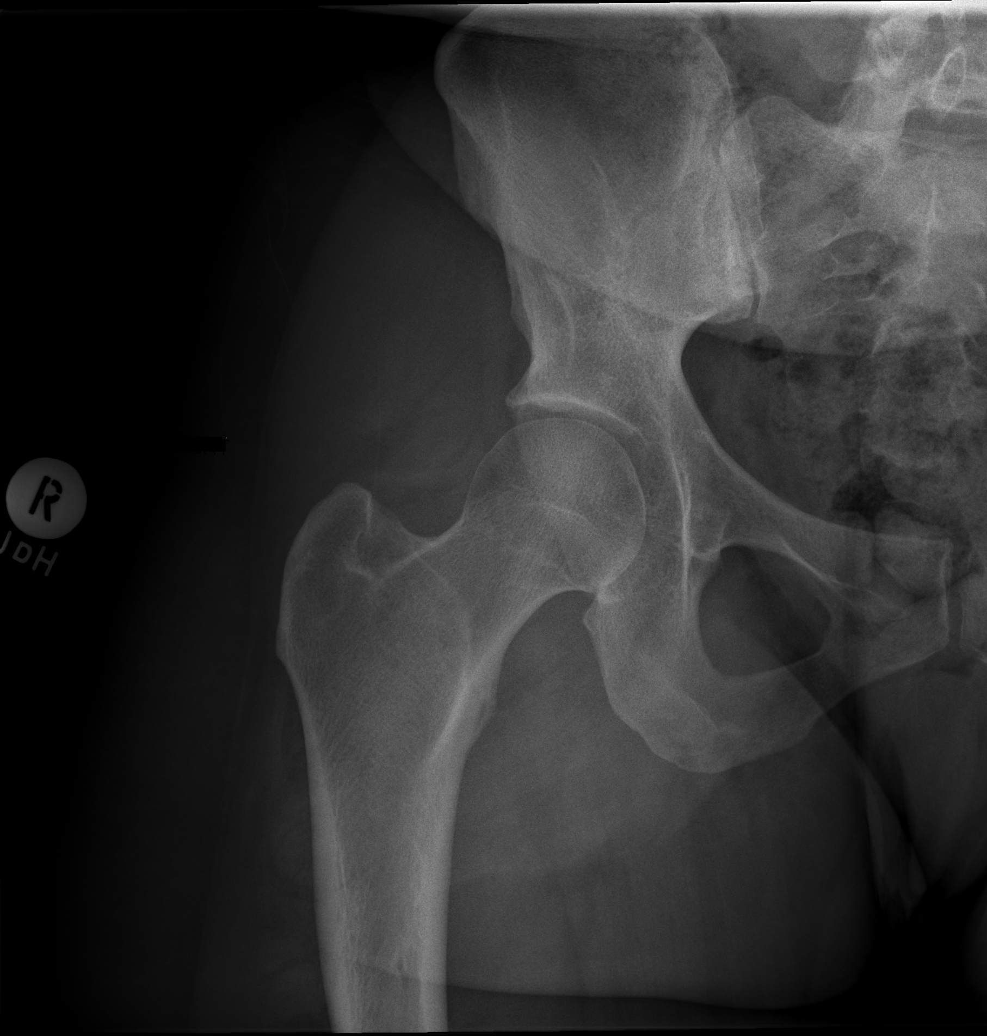

[w hip lat right]
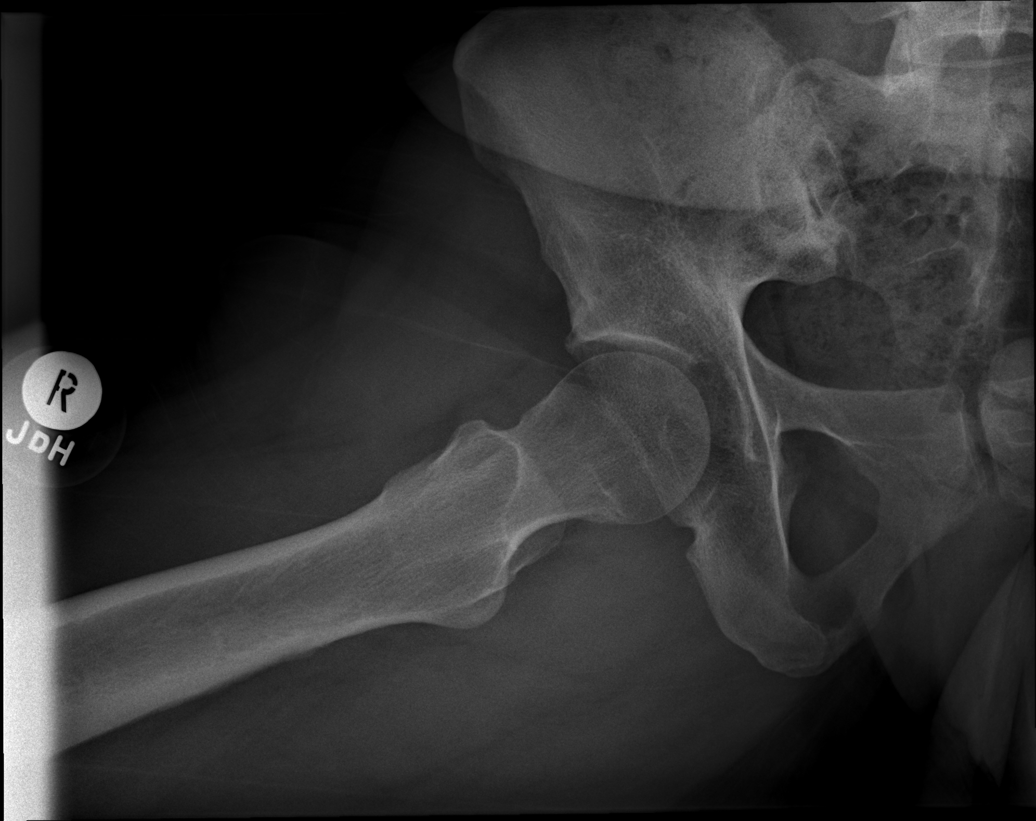

[5 of 5 positions shown; findings below may reference images not displayed]

FINDINGS: The bony pelvis is adequately mineralized. The sacrum and SI joints
are normal. The hip joint spaces are well maintained. AP and
frog-leg lateral views of both hips reveal no significant joint
space narrowing. The articular surfaces remains smoothly rounded.
The overlying soft tissues are unremarkable.
IMPRESSION: There is no acute or significant chronic bony abnormality of the
pelvis or hips.

## 2016-03-28 ENCOUNTER — Telehealth: Payer: Self-pay | Admitting: Medical

## 2016-03-28 NOTE — Telephone Encounter (Signed)
Called and l/m for patient to call us back.  

## 2016-03-28 NOTE — Telephone Encounter (Signed)
I [facility] from [facility] about reminder for liver test.  She apparently refilled Terbinafine/Lamisil recently (I didn't Reill but it shows in chart refill).  Not sure who sent the refill.     If she is using this for fungus, we need to check liver tests if she has used it regularly for at least a month.   So if this is the case, for example, if this is her second refill, have her return for liver tests soon.

## 2016-03-31 ENCOUNTER — Telehealth: Payer: Self-pay

## 2016-03-31 ENCOUNTER — Other Ambulatory Visit: Payer: Self-pay | Admitting: Medical

## 2016-03-31 MED ORDER — ROSUVASTATIN CALCIUM 40 MG PO TABS: 40.0000 mg | ORAL_TABLET | Freq: Every day | ORAL | 3 refills | Status: DC

## 2016-03-31 NOTE — Telephone Encounter (Signed)
done

## 2016-03-31 NOTE — Telephone Encounter (Signed)
Pt needs script for Crestor DAW called to pharmacy per faxed request. They state Crestor needs to be name brand as it is medically necessary and pt has copay care. [facility] [address] Trixie Rude/RLB

## 2016-03-31 NOTE — Telephone Encounter (Signed)
Called and l/m pt call us back.

## 2016-04-03 ENCOUNTER — Telehealth: Payer: Self-pay | Admitting: Medical

## 2016-04-03 ENCOUNTER — Encounter: Payer: Self-pay | Admitting: Medical

## 2016-04-03 ENCOUNTER — Ambulatory Visit (INDEPENDENT_AMBULATORY_CARE_PROVIDER_SITE_OTHER): Payer: BC Managed Care – PPO | Admitting: Medical

## 2016-04-03 VITALS — BP 110/70 | HR 80 | Wt 166.8 lb

## 2016-04-03 DIAGNOSIS — R7301 Impaired fasting glucose: Secondary | ICD-10-CM

## 2016-04-03 DIAGNOSIS — B351 Tinea unguium: Secondary | ICD-10-CM

## 2016-04-03 DIAGNOSIS — Z79899 Other long term (current) drug therapy: Secondary | ICD-10-CM

## 2016-04-03 DIAGNOSIS — E7801 Familial hypercholesterolemia: Secondary | ICD-10-CM

## 2016-04-03 DIAGNOSIS — E782 Mixed hyperlipidemia: Secondary | ICD-10-CM

## 2016-04-03 NOTE — Telephone Encounter (Signed)
error 

## 2016-04-03 NOTE — Progress Notes (Signed)
Subjective: Chief Complaint  Patient presents with  . follow up from meds and liver test    follow up from meds and liver test, spot on her back    Here for f/u.    onychomycosis - has used about 50 days of Lamisil.  Seeing new normal nail of both great toenails.  Here for labs  Has skin lesion she wants me to look at.  Has itchy lesion right upper back, ended up scratching it cutting herself.  This is healing.  Using [employer] program, utilizing small forewent meals.  Gets jittery sometime, that she attribute to low sugar at times.     She is worried that statin elevated her sugar back in 12/2015 causing a problem trying to get life insurance.  She has never been diabetic.  Past Medical History:  Diagnosis Date  . Anemia    history of  . Aneurysm of carotid artery (HCC)    hx/o - coiling 02/07/2010  . Chronic headache    since aneurysm diagnosis. mostly related to constipation as of 2017  . Edema   . Familial hypercholesteremia    LDL over 300; lipoprotein testing 01/2009  . Family history of heart disease in female family member before age 67    LDL over 300 prior  . H/O cardiovascular stress test 12/08/05   fair exercise capacity, no ST segment changes suggestive of ischemia  . H/O echocardiogram 03/22/01   mild asymmetric LVH, mild aortic sclerosis, mild mitral regurgitation  . History of blood transfusion    x2 due to heavy bleeding and anemia from fibroids  . S/P partial hysterectomy    due to uterine fibroids  . 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 Vitamin D deficiency   . Wears glasses    Current Outpatient Prescriptions on File Prior to Visit  Medication Sig Dispense Refill  . aspirin 325 MG tablet Take 325 mg by mouth every morning.     . Cholecalciferol (VITAMIN D3 ADULT GUMMIES) 1000 units CHEW Chew by mouth.    . ezetimibe (ZETIA) 10 MG tablet TAKE 1 TABLET (10 MG TOTAL) BY MOUTH DAILY. 90 tablet  3  . Methylcobalamin (B12-ACTIVE PO) Take by mouth daily.    . Multiple Vitamin (MULTIVITAMIN WITH MINERALS) TABS tablet Take 1 tablet by mouth daily. Reported on 04/17/2015    . rosuvastatin (CRESTOR) 40 MG tablet Take 1 tablet (40 mg total) by mouth daily. 90 tablet 3  . terbinafine (LAMISIL) 250 MG tablet TAKE 1 TABLET EVERY DAY 30 tablet 0   No current facility-administered medications on file prior to visit.    ROS as in subjective   Objective: BP 110/70   Pulse 80   Wt 166 lb 12.8 oz (75.7 kg)   SpO2 99%   BMI 28.41 kg/m   Wt Readings from Last 3 Encounters:  04/03/16 166 lb 12.8 oz (75.7 kg)  12/24/15 168 lb 3.2 oz (76.3 kg)  11/16/15 168 lb (76.2 kg)    Gen: wd, wn ,nad Right upper back with 4mm linear healing wound/abrasion, no other unusual skin finding Great toenails from base with clear normal nail, older yellow nail growing out, rest of nails look fine    Assessment: Encounter Diagnoses  Name Primary?  Marland Kitchen Onychomycosis Yes  . High risk medication use   . Impaired fasting blood sugar   . Familial hypercholesterolemia   . Mixed dyslipidemia     Plan:  onychomycosis - much improved.  Stop Lamisil, return in 2 weeks for fasting labs High risk med use - return for fasting labs in 2-3 weeks C/t Crestor, eat healthy low fat diet, get routine exercise, and since she is non fasting today and missed 6 days of Crestor due to pharmacy issue, plan to do fasting labs in 2-3 weeks.  Debbie [name] was seen today for follow up from meds and liver test.  Diagnoses and all orders for this visit:  Onychomycosis -     Comprehensive metabolic panel; Future -     Lipid panel; Future -     Hemoglobin A1c; Future  High risk medication use -     Comprehensive metabolic panel; Future -     Lipid panel; Future -     Hemoglobin A1c; Future  Impaired fasting blood sugar -     Comprehensive metabolic panel; Future -     Lipid panel; Future -     Hemoglobin A1c; Future  Familial  hypercholesterolemia -     Comprehensive metabolic panel; Future -     Lipid panel; Future -     Hemoglobin A1c; Future  Mixed dyslipidemia -     Comprehensive metabolic panel; Future -     Lipid panel; Future -     Hemoglobin A1c; Future

## 2016-04-18 ENCOUNTER — Other Ambulatory Visit: Payer: BC Managed Care – PPO

## 2016-04-18 DIAGNOSIS — B351 Tinea unguium: Secondary | ICD-10-CM

## 2016-04-18 DIAGNOSIS — R7301 Impaired fasting glucose: Secondary | ICD-10-CM

## 2016-04-18 DIAGNOSIS — E782 Mixed hyperlipidemia: Secondary | ICD-10-CM

## 2016-04-18 DIAGNOSIS — Z79899 Other long term (current) drug therapy: Secondary | ICD-10-CM

## 2016-04-18 DIAGNOSIS — E7801 Familial hypercholesterolemia: Secondary | ICD-10-CM

## 2016-04-18 LAB — COMPREHENSIVE METABOLIC PANEL
ALT: 22 U/L (ref 6–29)
AST: 17 U/L (ref 10–35)
Albumin: 4.3 g/dL (ref 3.6–5.1)
Alkaline Phosphatase: 46 U/L (ref 33–115)
BUN: 9 mg/dL (ref 7–25)
CO2: 22 mmol/L (ref 20–31)
Calcium: 9.4 mg/dL (ref 8.6–10.2)
Chloride: 108 mmol/L (ref 98–110)
Creat: 0.87 mg/dL (ref 0.50–1.10)
Glucose, Bld: 90 mg/dL (ref 65–99)
Potassium: 4.2 mmol/L (ref 3.5–5.3)
Sodium: 139 mmol/L (ref 135–146)
Total Bilirubin: 0.7 mg/dL (ref 0.2–1.2)
Total Protein: 6.9 g/dL (ref 6.1–8.1)

## 2016-04-18 LAB — LIPID PANEL
Cholesterol: 164 mg/dL (ref <200)
HDL: 44 mg/dL — ABNORMAL LOW (ref >50)
LDL Cholesterol: 109 mg/dL — ABNORMAL HIGH (ref <100)
Total CHOL/HDL Ratio: 3.7 Ratio (ref <5.0)
Triglycerides: 53 mg/dL (ref <150)
VLDL: 11 mg/dL (ref <30)

## 2016-04-19 LAB — HEMOGLOBIN A1C
Hgb A1c MFr Bld: 5.9 % — ABNORMAL HIGH (ref <5.7)
Mean Plasma Glucose: 123 mg/dL

## 2016-04-23 ENCOUNTER — Other Ambulatory Visit: Payer: Self-pay | Admitting: Medical

## 2016-04-25 ENCOUNTER — Ambulatory Visit (INDEPENDENT_AMBULATORY_CARE_PROVIDER_SITE_OTHER): Payer: BC Managed Care – PPO | Admitting: Medical

## 2016-04-25 VITALS — BP 120/70 | HR 74 | Wt 168.2 lb

## 2016-04-25 DIAGNOSIS — N649 Disorder of breast, unspecified: Secondary | ICD-10-CM

## 2016-04-25 DIAGNOSIS — L42 Pityriasis rosea: Secondary | ICD-10-CM | POA: Diagnosis not present

## 2016-04-25 DIAGNOSIS — L988 Other specified disorders of the skin and subcutaneous tissue: Secondary | ICD-10-CM

## 2016-04-25 NOTE — Progress Notes (Signed)
Subjective: Chief Complaint  Patient presents with  . rash under arms    rash under arms ,and on thighs, itching , some redness, 4 days   She notes almost a week ago started feeling bumps on right upper arm medially starting to spread into axilla.  1st two bumps were in right antecubital region.  Then the next day had some on left upper arm and axilla. Over the course of the last few days has new bumps on neck, upper chest, possibly back, and upper thighs.  They are now itchy, but didn't start itchy.  Nothing draining out of them.  No pain.  No household contacts with similar.   household members bath daily, changes bed clothes weekly.  No new medications recently.   Using nothing on the bumps.  Also has skin lesion of left breast x 1 year, unchanged on left breast.  No other aggravating or relieving factors. No other complaint.  Past Medical History:  Diagnosis Date  . Anemia    history of  . Aneurysm of carotid artery (HCC)    hx/o - coiling 02/07/2010  . Chronic headache    since aneurysm diagnosis. mostly related to constipation as of 2017  . Edema   . Familial hypercholesteremia    LDL over 300; lipoprotein testing 01/2009  . Family history of heart disease in female family member before age 68    LDL over 300 prior  . H/O cardiovascular stress test 12/08/05   fair exercise capacity, no ST segment changes suggestive of ischemia  . H/O echocardiogram 03/22/01   mild asymmetric LVH, mild aortic sclerosis, mild mitral regurgitation  . History of blood transfusion    x2 due to heavy bleeding and anemia from fibroids  . S/P partial hysterectomy    due to uterine fibroids  . 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 Vitamin D deficiency   . Wears glasses    Current Outpatient Prescriptions on File Prior to Visit  Medication Sig Dispense Refill  . aspirin 325 MG tablet Take 325 mg by mouth every morning.     . Cholecalciferol  (VITAMIN D3 ADULT GUMMIES) 1000 units CHEW Chew by mouth.    . ezetimibe (ZETIA) 10 MG tablet TAKE 1 TABLET (10 MG TOTAL) BY MOUTH DAILY. 90 tablet 2  . Methylcobalamin (B12-ACTIVE PO) Take by mouth daily.    . Multiple Vitamin (MULTIVITAMIN WITH MINERALS) TABS tablet Take 1 tablet by mouth daily. Reported on 04/17/2015    . rosuvastatin (CRESTOR) 40 MG tablet Take 1 tablet (40 mg total) by mouth daily. 90 tablet 3  . terbinafine (LAMISIL) 250 MG tablet TAKE 1 TABLET EVERY DAY (Patient not taking: Reported on 04/25/2016) 30 tablet 0   No current facility-administered medications on file prior to visit.     ROS as in subjective   Objective: BP 120/70   Pulse 74   Wt 168 lb 3.2 oz (76.3 kg)   SpO2 98%   BMI 28.65 kg/m   Gen: wd, wn, nad Left breast just superolateral to areola approx 3 o'clock, with 3mm diameter pink round lesions, slightly raised, nonspecific There are scattered flesh colored lesions of bilat axilla, neck, upper chest, left breast, left flank, bilat upper thighs, some are papular 1-3 mm diameter, some larger, several along lines of langerhans, right axilla with possible herald patch No other worrisome skin lesions   Assessment: Encounter Diagnoses  Name Primary?  . Pityriasis rosea  Yes  . Skin lesion of breast     Plan: pityriasis rosea - discussed symptoms, diagnosis, usual progression of the syndrome.   Not contagious.   Discussed possible supportive measures.  Reassured  Skin lesion of left breast - discussed findings.  Referral to dermatology.    Verlon AuLeslie was seen today for rash under arms.  Diagnoses and all orders for this visit:  Pityriasis rosea  Skin lesion of breast -     Ambulatory referral to Dermatology

## 2016-04-25 NOTE — Patient Instructions (Signed)
Pityriasis Rosea Pityriasis rosea is a rash that usually appears on the trunk of the body. It may also appear on the upper arms and upper legs. It usually begins as a single patch, and then more patches begin to develop. The rash may cause mild itching, but it normally does not cause other problems. It usually goes away without treatment. However, it may take weeks or months for the rash to go away completely. What are the causes? The cause of this condition is not known. The condition does not spread from person to person (is noncontagious). What increases the risk? This condition is more likely to develop in young adults and children. It is most common in the spring and fall. What are the signs or symptoms? The main symptom of this condition is a rash.  The rash usually begins with a single oval patch that is larger than the ones that follow. This is called a herald patch. It generally appears a week or more before the rest of the rash appears.  When more patches start to develop, they spread quickly on the trunk, back, and arms. These patches are smaller than the first one.  The patches that make up the rash are usually oval-shaped and pink or red in color. They are usually flat, but they may sometimes be raised so that they can be felt with a finger. They may also be finely crinkled and have a scaly ring around the edge.  The rash does not typically appear on areas of the skin that are exposed to the sun. Most people who have this condition do not have other symptoms, but some have mild itching. In a few cases, a mild headache or body aches may occur before the rash appears and then go away. How is this diagnosed? Your health care provider may diagnose this condition by doing a physical exam and taking your medical history. To rule out other possible causes for the rash, the health care provider may order blood tests or take a skin sample from the rash to be looked at under a microscope. How  is this treated? Usually, treatment is not needed for this condition. The rash will probably go away on its own in 4-8 weeks. In some cases, a health care provider may recommend or prescribe medicine to reduce itching. Follow these instructions at home:  Take medicines only as directed by your health care provider.  Avoid scratching the affected areas of skin.  Do not take hot baths or use a sauna. Use only warm water when bathing or showering. Heat can increase itching. Contact a health care provider if:  Your rash does not go away in 8 weeks.  Your rash gets much worse.  You have a fever.  You have swelling or pain in the rash area.  You have fluid, blood, or pus coming from the rash area. This information is not intended to replace advice given to you by your health care provider. Make sure you discuss any questions you have with your health care provider. Document Released: 03/19/2001 Document Revised: 07/19/2015 Document Reviewed: 01/18/2014 Elsevier Interactive Patient Education  2017 Elsevier Inc.  

## 2016-06-07 ENCOUNTER — Telehealth: Payer: Self-pay | Admitting: Medical

## 2016-06-11 ENCOUNTER — Ambulatory Visit (INDEPENDENT_AMBULATORY_CARE_PROVIDER_SITE_OTHER): Payer: BC Managed Care – PPO | Admitting: Medical

## 2016-06-11 ENCOUNTER — Encounter: Payer: Self-pay | Admitting: Medical

## 2016-06-11 VITALS — Wt 163.0 lb

## 2016-06-11 DIAGNOSIS — R002 Palpitations: Secondary | ICD-10-CM

## 2016-06-11 DIAGNOSIS — R42 Dizziness and giddiness: Secondary | ICD-10-CM

## 2016-06-11 LAB — CBC
HCT: 40.3 % (ref 35.0–45.0)
Hemoglobin: 13 g/dL (ref 11.7–15.5)
MCH: 29 pg (ref 27.0–33.0)
MCHC: 32.3 g/dL (ref 32.0–36.0)
MCV: 90 fL (ref 80.0–100.0)
MPV: 10.8 fL (ref 7.5–12.5)
Platelets: 283 10*3/uL (ref 140–400)
RBC: 4.48 MIL/uL (ref 3.80–5.10)
RDW: 13.3 % (ref 11.0–15.0)
WBC: 6.3 10*3/uL (ref 4.0–10.5)

## 2016-06-11 LAB — BASIC METABOLIC PANEL
BUN: 11 mg/dL (ref 7–25)
CO2: 26 mmol/L (ref 20–31)
Calcium: 9.6 mg/dL (ref 8.6–10.2)
Chloride: 105 mmol/L (ref 98–110)
Creat: 0.85 mg/dL (ref 0.50–1.10)
Glucose, Bld: 104 mg/dL — ABNORMAL HIGH (ref 65–99)
Potassium: 4 mmol/L (ref 3.5–5.3)
Sodium: 138 mmol/L (ref 135–146)

## 2016-06-11 NOTE — Patient Instructions (Signed)
  [facility] [address] Gelene Mink [address], [place]. [address] [place], [place] [address]  [phone] [email or web address]   Vincenza Hews, M.D. Susanne Greenhouse, O.D. [address] [place], [place] [address] Medical telephone: [phone] Optical telephone: [phone]

## 2016-06-11 NOTE — Progress Notes (Signed)
Subjective: Chief Complaint  Patient presents with  . blurred vision    blurred vision x 1 week bouble vision , lighthead at times   Here for not feeling right.    She notes for palpations for months but worse in recent weeks.   Feels brief palpations lasting for seconds . Sometimes feels lightheaded with these.   Dizzy at times.   When this happens at times gets nervous stomach feeling, and urge to defecate.    She notes good water intake, doesn't skip meals, very limited caffeine and sugar intake, exercises without c/o.  Denies associated nausea, sweats, SOB, paresthesias.  Denies recent illness, no headaches, no chills, no weight changes.  She denies major stress.  No other aggravating or relieving factors. No other complaint.  Past Medical History:  Diagnosis Date  . Anemia    history of  . Aneurysm of carotid artery (HCC)    hx/o - coiling 02/07/2010  . Chronic headache    since aneurysm diagnosis. mostly related to constipation as of 2017  . Edema   . Familial hypercholesteremia    LDL over 300; lipoprotein testing 01/2009  . Family history of heart disease in female family member before age 33    LDL over 300 prior  . H/O cardiovascular stress test 12/08/05   fair exercise capacity, no ST segment changes suggestive of ischemia  . H/O echocardiogram 03/22/01   mild asymmetric LVH, mild aortic sclerosis, mild mitral regurgitation  . History of blood transfusion    x2 due to heavy bleeding and anemia from fibroids  . S/P partial hysterectomy    due to uterine fibroids  . 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 Vitamin D deficiency   . Wears glasses    Current Outpatient Prescriptions on File Prior to Visit  Medication Sig Dispense Refill  . aspirin 325 MG tablet Take 325 mg by mouth every morning.     . Cholecalciferol (VITAMIN D3 ADULT GUMMIES) 1000 units CHEW Chew by mouth.    . ezetimibe (ZETIA) 10 MG tablet  TAKE 1 TABLET (10 MG TOTAL) BY MOUTH DAILY. 90 tablet 2  . Methylcobalamin (B12-ACTIVE PO) Take by mouth daily.    . Multiple Vitamin (MULTIVITAMIN WITH MINERALS) TABS tablet Take 1 tablet by mouth daily. Reported on 04/17/2015    . rosuvastatin (CRESTOR) 40 MG tablet Take 1 tablet (40 mg total) by mouth daily. (Patient not taking: Reported on 06/11/2016) 90 tablet 3   No current facility-administered medications on file prior to visit.    Review of Systems Constitutional: -fever, -chills, -sweats, -unexpected weight change,-fatigue ENT: -runny nose, -ear pain, -sore throat Cardiology:  -chest pain, +palpitations, -edema Respiratory: -cough, -shortness of breath, -wheezing Gastroenterology: -abdominal pain, -nausea, -vomiting, -diarrhea, +constipation  Hematology: -bleeding or bruising problems Musculoskeletal: -arthralgias, -myalgias, -joint swelling, -back pain Ophthalmology: -vision changes Urology: -dysuria, -difficulty urinating, -hematuria, -urinary frequency, -urgency Neurology: -headache, -weakness, -tingling, -numbness   Objective: Wt 163 lb (73.9 kg)   SpO2 99%   BMI 27.76 kg/m   General appearance: alert, no distress, WD/WN,  HEENT: normocephalic, sclerae anicteric, PERRLA, EOMi, nares patent, no discharge or erythema, pharynx normal Oral cavity: MMM, no lesions Neck: supple, no lymphadenopathy, no thyromegaly, no masses, no bruits Heart: RRR, normal S1, S2, no murmurs Lungs: CTA bilaterally, no wheezes, rhonchi, or rales Abdomen: +bs, soft, non tender, non distended, no masses, no hepatomegaly, no splenomegaly Extremities: no edema, no cyanosis, no  clubbing Pulses: 2+ symmetric, upper and lower extremities, normal cap refill Neurological: alert, oriented x 3, CN2-12 intact, strength normal upper extremities and lower extremities, sensation normal throughout, DTRs 2+ throughout, no cerebellar signs, gait normal Psychiatric: normal affect, behavior normal, pleasant     Adult ECG Report  Indication: palpitation  Rate: 74 bpm  Rhythm: normal sinus rhythm  QRS Axis: 53 degrees  PR Interval:  QRS Duration: 78ms  QTc:  Conduction Disturbances: none  Other Abnormalities: T wave inversion V1, questionable ST elevation vs J point elevation  Patient's cardiac risk factors are: hypertension.  EKG comparison: 03/2015 EKG   Narrative Interpretation: no acute changes    Assessment: Encounter Diagnoses  Name Primary?  . Palpitations Yes  . Lightheaded     Plan: Discussed symptoms, possible causes including electrolyte disturbance, anxiety, hormonal issue, cardiac arrhthymias or other.   Doubt dietary issue, doubt psychiatric cause.   Labs today, EKG reviewed, consider cardiac referral for event monitor.  Debbie [name] was seen today for blurred vision.  Diagnoses and all orders for this visit:  Palpitations -     EKG 12-Lead -     Basic metabolic panel -     TSH -     CBC  Lightheaded -     EKG 12-Lead -     Basic metabolic panel -     TSH -     CBC

## 2016-06-12 LAB — TSH: TSH: 0.84 mIU/L

## 2016-06-13 ENCOUNTER — Other Ambulatory Visit: Payer: Self-pay

## 2016-06-13 DIAGNOSIS — R002 Palpitations: Secondary | ICD-10-CM

## 2016-06-16 NOTE — Telephone Encounter (Signed)
P.A. Now approved, called pharmacy & went thru for $3, called pt & left message

## 2016-06-16 NOTE — Telephone Encounter (Signed)
P.A. Denied, called [facility] t# [phone] and was denied due to they need reason for failures of previous tried meds.  Resubmitted P.A. with reason for failure

## 2016-06-27 ENCOUNTER — Telehealth: Payer: Self-pay

## 2016-06-27 NOTE — Telephone Encounter (Signed)
Called pt and l/m for her call  About her referral appt to cardio, to let her they had to call  3 times.

## 2016-07-04 ENCOUNTER — Ambulatory Visit: Payer: BC Managed Care – PPO | Admitting: Cardiology

## 2016-07-18 ENCOUNTER — Ambulatory Visit (INDEPENDENT_AMBULATORY_CARE_PROVIDER_SITE_OTHER): Payer: BC Managed Care – PPO | Admitting: Cardiology

## 2016-07-18 ENCOUNTER — Encounter: Payer: Self-pay | Admitting: Cardiology

## 2016-07-18 ENCOUNTER — Encounter (INDEPENDENT_AMBULATORY_CARE_PROVIDER_SITE_OTHER): Payer: Self-pay

## 2016-07-18 VITALS — BP 124/78 | HR 84 | Ht 65.0 in | Wt 161.6 lb

## 2016-07-18 DIAGNOSIS — R002 Palpitations: Secondary | ICD-10-CM

## 2016-07-18 DIAGNOSIS — Z9889 Other specified postprocedural states: Secondary | ICD-10-CM | POA: Diagnosis not present

## 2016-07-18 DIAGNOSIS — Z8679 Personal history of other diseases of the circulatory system: Secondary | ICD-10-CM | POA: Diagnosis not present

## 2016-07-18 DIAGNOSIS — E782 Mixed hyperlipidemia: Secondary | ICD-10-CM

## 2016-07-18 DIAGNOSIS — Z8249 Family history of ischemic heart disease and other diseases of the circulatory system: Secondary | ICD-10-CM | POA: Insufficient documentation

## 2016-07-18 NOTE — Progress Notes (Signed)
PCP: Ronnald Nian, MD  Clinic Note: Chief Complaint  Patient presents with  . New Evaluation    fluttering in the chest  . Shortness of Breath    pt states some SOB and states feeling light headed and dizzy   . Edema    pt states both ankles sometimes     HPI: Debbie [name] is a 46 y.o. female who is being seen today for the evaluation of Palpitations at the request of Jac Canavan, PA-C.  KARLEY PHO was last seen on 06/11/16 by Mr. Tysinger - noted palpitations for several months, but progressively worse over last several weeks. - May feel lightheaded & dizzy. Notes "good water intake & PO intake". Avoids caffeine & sugar.  Recent Hospitalizations: n/a  Studies Personally Reviewed - (if available, images/films reviewed: From Epic Chart or Care Everywhere)  n/a  Interval History: Debbie [name] presents today for evaluation of her palpitations. She describes a fluttering sensation in her chest that is been going on now for the last several months. It's recently however gotten worse than what she is been having intermittently for years. The spells will last a few seconds up to a minute. Nothing really triggers them, she is not noticing any increase in stress in her life or increased use of caffeine or sugary foods. In fact she is trying to avoid these. She usually notes them at rest but occasionally occur with exercise or after exercise. She doesn't really say it feels like her heart is going fast except for one episode when her heart rate went relatively fast lasting about a minute and she felt dizzy.  Fluttering sensation when it occurs makes her feel like she has to take deep breath or cough to catch her breath. When she does that, the sensation usually stops.   In addition to this fluttering she is also noted occasional central Chest discomfort. However this is never associated with exertion, and usually happens at rest. She has occasional episodes of feeling lightheaded which  is usually associated with standing up too quickly or stretching/yellowing. She had the one episode of fast heart rate fell that she noted, but is also had one episode where she almost passed out. She was sitting downAnd just started to feel spacey headed as though she would pass out. She started getting nervous and had a shaking sensation. After that went up, but not before.  She denies any exertional chest tightness /pressure or dyspnea. No true syncope, TIA/amaurosis fugax symptoms.  No claudication.  ROS: A comprehensive was performed. Review of Systems  Constitutional: Negative for malaise/fatigue.  Respiratory: Negative.   Cardiovascular:       Per history of present illness  Gastrointestinal: Negative for blood in stool and melena.  Genitourinary: Negative for hematuria.  Musculoskeletal: Negative for falls and myalgias.  Neurological: Positive for dizziness.       Positives per history of present illness  Endo/Heme/Allergies: Negative for environmental allergies.  Psychiatric/Behavioral: Negative for depression. The patient is not nervous/anxious and does not have insomnia.   All other systems reviewed and are negative.  I have reviewed and (if needed) personally updated the patient's problem list, medications, allergies, past medical and surgical history, social and family history.   Past Medical History:  Diagnosis Date  . Anemia    history of  . Aneurysm of carotid artery (HCC)    hx/o - coiling 02/07/2010  . Chronic headache    since aneurysm diagnosis. mostly related to constipation  as of 2017  . Edema   . Familial hypercholesteremia    LDL over 300; lipoprotein testing 01/2009  . Family history of heart disease in female family member before age 17    LDL over 300 prior  . H/O cardiovascular stress test 12/08/05   fair exercise capacity, no ST segment changes suggestive of ischemia  . H/O echocardiogram 03/22/01   mild asymmetric LVH, mild aortic sclerosis, mild  mitral regurgitation  . History of blood transfusion    x2 due to heavy bleeding and anemia from fibroids  . S/P partial hysterectomy    due to uterine fibroids  . 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 Nigel Sloop)  . Vitamin D deficiency   . Wears glasses     Past Surgical History:  Procedure Laterality Date  . ANEURYSM COILING  02/07/10   stent assisted coiling of right internal carotid artery aneurysm in periophthalmic region  . CESAREAN SECTION    . COLONOSCOPY  2011   Dr. Loreta Ave, anemia workup  . ESOPHAGOGASTRODUODENOSCOPY  2011   Dr. Loreta Ave  . HERNIA REPAIR     epigastric hernia repair age 76, right inguinal hernia as a child  . PARTIAL HYSTERECTOMY     abdominal due to fibroids, still has ovaries, Dr. Layne Benton  . TUBAL LIGATION    . tubal reversal  2008    Current Meds  Medication Sig  . aspirin 325 MG tablet Take 325 mg by mouth every morning.   . cyanocobalamin 1000 MCG tablet Take 1,000 mcg by mouth daily.  Marland Kitchen ezetimibe (ZETIA) 10 MG tablet TAKE 1 TABLET (10 MG TOTAL) BY MOUTH DAILY.  . Multiple Vitamin (MULTIVITAMIN WITH MINERALS) TABS tablet Take 1 tablet by mouth daily. Reported on 04/17/2015  . rosuvastatin (CRESTOR) 40 MG tablet Take 1 tablet (40 mg total) by mouth daily.    Allergies  Allergen Reactions  . Contrast Media [Iodinated Diagnostic Agents]     For CT Angio, breaks out in hives  . Flexeril [Cyclobenzaprine]   . Lipitor [Atorvastatin Calcium] Other (See Comments)    Pt states she had a bad reaction several years ago when taking this  . Paxil [Paroxetine Hcl]     Tongue swelling had trouble swallowing   . Sulfa Antibiotics Other (See Comments)    Unknown reaction    Social History   Social History  . Marital status: Married    Spouse name: N/A  . Number of children: N/A  . Years of education: N/A   Occupational History  . 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 Smoking status: Never Smoker  . Smokeless tobacco: Never Used  . Alcohol use No  . Drug use: No  . Sexual activity: Not Asked   Other Topics Concern  . None   Social History Narrative   Married, 2 biological children, 6 foster children, walking, calisthenics, stationary bike at least 5 days per week.   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  Wt Readings  from Last 3 Encounters:  07/18/16 161 lb 9.6 oz (73.3 kg)  06/11/16 163 lb (73.9 kg)  04/25/16 168 lb 3.2 oz (76.3 kg)    PHYSICAL EXAM BP 124/78   Pulse 84   Ht 5\' 5"  (1.651 m)   Wt 161 lb 9.6 oz (73.3 kg)   SpO2 99%   BMI 26.89 kg/m  General appearance: alert, cooperative, appears stated age, no distress.Healthy appearing. Well nourished & well-groomed. HEENT: [place]/AT, EOMI, MMM, anicteric sclera Neck: no adenopathy, no carotid bruit and no JVD Lungs: clear to auscultation bilaterally, normal percussion bilaterally and non-labored Heart: regular rate and rhythm, S1 &S2 normal, no murmur, click, rub or gallop; non-displaced PMI Abdomen: soft, non-tender; bowel sounds normal; no masses,  no organomegaly; no HJR Extremities: extremities normal, atraumatic, no cyanosis, or edema  Pulses: 2+ and symmetric;  Skin: mobility and turgor normal, no edema, no evidence of bleeding or bruising, no lesions noted, temperature normal and texture normal Neurologic: Mental status: Alert & oriented x 3, thought content appropriate; non-focal exam.  Pleasant mood & affect. Cranial nerves:  normal (II-XII grossly intact); Normal gait.    Adult ECG Report n/a  Other studies Reviewed: Additional studies/ records that were reviewed today include:  Recent Labs:   Lab Results  Component Value Date   CREATININE 0.85 06/11/2016   BUN 11 06/11/2016   NA 138 06/11/2016   K 4.0 06/11/2016   CL 105 06/11/2016   CO2 26 06/11/2016   Lab Results  Component Value Date   CHOL 164 04/18/2016   HDL 44 (L) 04/18/2016   LDLCALC 109 (H) 04/18/2016   TRIG 53 04/18/2016   CHOLHDL 3.7 04/18/2016    ASSESSMENT / PLAN: Problem List Items Addressed This Visit    Family history of premature CAD (Chronic)     She is relatively young and does not have any significant symptoms to suggest coronary disease, however depending on what we see on the monitor we may want to consider  At a minimum evaluating with a GXT. If simply PACs are noted, then I would not, however there are PVCs, I would probably consider either a GXT or stress echo.      Relevant Orders   Cardiac event monitor   Heart palpitations - Primary     These episodes sound most consistent with probably PACs or PVCs potentially in bigeminy/trigeminy or couplets/triplets.They don't really sound enough to be true arrhythmias unless very short. They don't happen all that often, in fact may be happening  3 or 4 times a month, therefore we will need to monitor for full 30 day event monitor. I don't hear any abnormal heart sounds or murmurs that would warrant checking an echocardiogram at this time unless we see frequent PVCs.      Relevant Orders   Cardiac event monitor   History of cerebral aneurysm repair (Chronic)     She was noticing some dizzy spells, and has an existing vertebral arteriy  Aneurysm. Since she is having these weird dizzy spells ((the one near syncopal spell) that don't seem to be related to her vertebral aneurysm, I recommend that she be reevaluated by Dr. Titus Dubin  From vascular radiology in order to determine if  there is any progression of this lesion.      Mixed dyslipidemia     With her minimal risk factors, LDL of roughly 109 is probably not that far from goal. However before evaluation reveals more concerning findings, would then want to be  more aggressive.         Current medicines are reviewed at length with the patient today. (+/- concerns) n/a The following changes have been made: n/a  Patient Instructions  SCHEDULE AT [address] AT [address] Your physician has recommended that you wear an event monitor 30 DAY. Event monitors are medical devices that record the heart's electrical activity. Doctors most often us these monitors to diagnose arrhythmias. Arrhythmias are problems with the speed or rhythm of the heartbeat. The monitor is a small, portable device. You can wear one while you do your normal daily activities. This is usually used to diagnose what is causing palpitations/syncope (passing out).   NO OTHER CHANGES     Your physician recommends that you schedule a follow-up appointment in 7 WEEKS WITH DR HARDING.    Studies Ordered:   Orders Placed This Encounter  Procedures  . Cardiac event monitor      Bryan Lemmaavid Harding, M.D., M.S. Interventional Cardiologist   Pager # [phone] Phone # [phone] [address] [place], [place] [id number]

## 2016-07-18 NOTE — Patient Instructions (Addendum)
SCHEDULE AT [address] AT [address] Your physician has recommended that you wear an event monitor 30 DAY. Event monitors are medical devices that record the heart's electrical activity. Doctors most often us these monitors to diagnose arrhythmias. Arrhythmias are problems with the speed or rhythm of the heartbeat. The monitor is a small, portable device. You can wear one while you do your normal daily activities. This is usually used to diagnose what is causing palpitations/syncope (passing out).   NO OTHER CHANGES     Your physician recommends that you schedule a follow-up appointment in 7 WEEKS WITH DR HARDING.

## 2016-07-20 ENCOUNTER — Encounter: Payer: Self-pay | Admitting: Cardiology

## 2016-07-20 NOTE — Assessment & Plan Note (Signed)
She was noticing some dizzy spells, and has an existing vertebral arteriy  Aneurysm. Since she is having these weird dizzy spells ((the one near syncopal spell) that don't seem to be related to her vertebral aneurysm, I recommend that she be reevaluated by Dr. Titus Dubinevashwar  From vascular radiology in order to determine if there is any progression of this lesion.

## 2016-07-20 NOTE — Assessment & Plan Note (Addendum)
These episodes sound most consistent with probably PACs or PVCs potentially in bigeminy/trigeminy or couplets/triplets.They don't really sound enough to be true arrhythmias unless very short. They don't happen all that often, in fact may be happening  3 or 4 times a month, therefore we will need to monitor for full 30 day event monitor. I don't hear any abnormal heart sounds or murmurs that would warrant checking an echocardiogram at this time unless we see frequent PVCs.

## 2016-07-20 NOTE — Assessment & Plan Note (Signed)
With her minimal risk factors, LDL of roughly 109 is probably not that far from goal. However before evaluation reveals more concerning findings, would then want to be more aggressive.

## 2016-07-20 NOTE — Assessment & Plan Note (Signed)
She is relatively young and does not have any significant symptoms to suggest coronary disease, however depending on what we see on the monitor we may want to consider  At a minimum evaluating with a GXT. If simply PACs are noted, then I would not, however there are PVCs, I would probably consider either a GXT or stress echo.

## 2016-08-04 ENCOUNTER — Ambulatory Visit (INDEPENDENT_AMBULATORY_CARE_PROVIDER_SITE_OTHER): Payer: BC Managed Care – PPO

## 2016-08-04 DIAGNOSIS — R002 Palpitations: Secondary | ICD-10-CM

## 2016-08-04 DIAGNOSIS — Z8249 Family history of ischemic heart disease and other diseases of the circulatory system: Secondary | ICD-10-CM

## 2016-08-14 ENCOUNTER — Encounter: Payer: Self-pay | Admitting: Family Medicine

## 2016-08-14 ENCOUNTER — Ambulatory Visit (INDEPENDENT_AMBULATORY_CARE_PROVIDER_SITE_OTHER): Payer: BC Managed Care – PPO | Admitting: Family Medicine

## 2016-08-14 VITALS — BP 130/82 | HR 80 | Temp 98.2°F | Wt 160.0 lb

## 2016-08-14 DIAGNOSIS — L237 Allergic contact dermatitis due to plants, except food: Secondary | ICD-10-CM

## 2016-08-14 DIAGNOSIS — S80862A Insect bite (nonvenomous), left lower leg, initial encounter: Secondary | ICD-10-CM | POA: Diagnosis not present

## 2016-08-14 DIAGNOSIS — W57XXXA Bitten or stung by nonvenomous insect and other nonvenomous arthropods, initial encounter: Secondary | ICD-10-CM

## 2016-08-14 DIAGNOSIS — R519 Headache, unspecified: Secondary | ICD-10-CM

## 2016-08-14 DIAGNOSIS — R51 Headache: Secondary | ICD-10-CM

## 2016-08-14 NOTE — Progress Notes (Signed)
   Subjective:    Patient ID: Debbie [name], female    DOB: 1970/09/04, 46 y.o.   MRN: [id number]  HPI Chief Complaint  Patient presents with  . tick bite    tick bite- noticied sunday 2 weeks ago, itches, rash, fatigue, and headache.    She is here with complaints of a tick bite 2 weeks ago on her right posterior lower leg. Initially she had itching and irritation but states this has improved. States the tick had a white spot on it and she thinks it was a "lone star tick". States the tick was not engorged when she removed it.  No fever, rash on her palms or soles, N/V/D. Complains of a pruritic rash to her inner left thigh that she thinks is related to poison ivy exposure. Has used calamine lotion and warm water.   She also complains of a dull posterior headache for the past 2 days. Denies blurred or double vision. Had one episode of lightheadedness but this has sense resolved.   States she has a history of aneurysms. Left vertebral and right cerebral- that has been coiled and stented.   Reviewed allergies, medications, past medical, surgical,  and social history.   Review of Systems Pertinent positives and negatives in the history of present illness.     Objective:   Physical Exam BP 130/82   Pulse 80   Temp 98.2 F (36.8 C) (Oral)   Wt 160 lb (72.6 kg)   SpO2 98%   BMI 26.63 kg/m         Assessment & Plan:  Tick bite of left lower leg, initial encounter  Poison ivy dermatitis  Acute nonintractable headache, unspecified headache type  Discussed that her symptoms do not appear to be related. Discussed that the tick bite does not look infected, no EM, fever or any other symptoms that make me think she is in any danger from this. Her headache does not appear to be related and I recommend using her usual headache medicine for this since she has not tried this yet. She will keep us posted regarding worsening headache or any new symptoms.  She will use calamine lotion and  cool baths on dermatitis and alert us if she notices any sign of secondary infection.  Follow up as needed.

## 2016-08-25 ENCOUNTER — Telehealth: Payer: Self-pay | Admitting: Cardiology

## 2016-08-25 NOTE — Telephone Encounter (Signed)
Patient calling, states that she needs some more waterproof adhesive strips for her monitor because she works outside and she sweats. Patient states that she will need enough to last her up until 09-03-16, when she will return monitor. Thanks.

## 2016-08-26 NOTE — Telephone Encounter (Signed)
A package of 4 water proof Body Guardian Electrodes will be left for patient at the check in desk at [facility] [address] [facility].  Reminded patient office will be closed 08/27/2016.

## 2016-09-08 ENCOUNTER — Encounter: Payer: Self-pay | Admitting: Cardiology

## 2016-09-08 ENCOUNTER — Ambulatory Visit (INDEPENDENT_AMBULATORY_CARE_PROVIDER_SITE_OTHER): Payer: BC Managed Care – PPO | Admitting: Cardiology

## 2016-09-08 VITALS — BP 143/85 | HR 73 | Ht 64.0 in | Wt 162.4 lb

## 2016-09-08 DIAGNOSIS — Z8249 Family history of ischemic heart disease and other diseases of the circulatory system: Secondary | ICD-10-CM | POA: Diagnosis not present

## 2016-09-08 DIAGNOSIS — R002 Palpitations: Secondary | ICD-10-CM

## 2016-09-08 DIAGNOSIS — E7801 Familial hypercholesterolemia: Secondary | ICD-10-CM

## 2016-09-08 NOTE — Patient Instructions (Signed)
SCHEDULE AT [address] Your physician has requested that you have cardiac CT CALCIUM SCORING. Cardiac computed tomography (CT) is a painless test that uses an x-ray machine to take clear, detailed pictures of your heart. For further information please visit [email or web address]. Please follow instruction sheet as given.   NO MEDICATION CHANGES    Your physician recommends that you schedule a follow-up appointment in JAN 2018 WITH DR HARDING.

## 2016-09-08 NOTE — Progress Notes (Signed)
PCP: Ronnald Nian, MD  Clinic Note: Chief Complaint  Patient presents with  . Follow-up    s/p monitor for palpitations.    HPI: Debbie [name] is a 46 y.o. female who is being seen today for follow-up evaluation of heart palpitations & Family h/o CAD at the request of Ronnald Nian, MD.  Debbie [name] was last initially seen in consultation on 07/18/2016 - She noted episodes of fluttering in her chest going on for several months. Fluttering sensations take her breath away. He also noticed with central chest discomfort.  Recent Hospitalizations: None  Studies Personally Reviewed - (if available, images/films reviewed: From Epic Chart or Care Everywhere)  - Event monitor was not available when I saw her.  Interval History: Debbie [name] returns today overall doing pretty well. She says she didn't really feel that many palpitations during the 3 weeks that she wore monitor. Apparently she needed to get the patches that were sweat resistant and did not get them back in time before she turned the machine in. She really had noticed the same level of palpitations while wearing the monitor and has not had it since. She's not having the chest discomfort that she was having either. What she is noticing is a pinching sensation across her chest every now and then usually is after she's been out doing hard work. It only lasts a few seconds and then goes away. Had any the fast heartbeats to make her feel as though she'll pass out. No lightheadedness, dizziness or syncope/near-syncope. No melena, hematochezia, hemoptysis, hematuria or.hematemesis   No PND, orthopnea or edema. No TIA/amaurosis fugax symptoms. No melena, hematochezia, hematuria, or epstaxis. No claudication.  ROS: A comprehensive was performed. Review of Systems  Constitutional: Negative for malaise/fatigue.  HENT: Negative for congestion.   Respiratory: Positive for cough and shortness of breath.   Cardiovascular:       Per history  of present illness  Gastrointestinal: Negative for blood in stool and melena.  Genitourinary: Negative for hematuria.  Musculoskeletal: Positive for myalgias (Not significant).  Psychiatric/Behavioral: Negative.   All other systems reviewed and are negative.   I have reviewed and (if needed) personally updated the patient's problem list, medications, allergies, past medical and surgical history, social and family history.   Past Medical History:  Diagnosis Date  . Anemia    history of  . Aneurysm of carotid artery (HCC)    hx/o - coiling 02/07/2010  . Chronic headache    since aneurysm diagnosis. mostly related to constipation as of 2017  . Edema   . Familial hypercholesteremia    LDL over 300; lipoprotein testing 01/2009  . Family history of heart disease in female family member before age 72    LDL over 300 prior  . H/O cardiovascular stress test 12/08/05   fair exercise capacity, no ST segment changes suggestive of ischemia  . H/O echocardiogram 03/22/01   mild asymmetric LVH, mild aortic sclerosis, mild mitral regurgitation  . History of blood transfusion    x2 due to heavy bleeding and anemia from fibroids  . S/P partial hysterectomy    due to uterine fibroids  . 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 Nigel Sloop)  . Vitamin D deficiency   . Wears glasses     Past Surgical History:  Procedure Laterality Date  . ANEURYSM COILING  02/07/10   stent assisted coiling of right internal carotid artery aneurysm  in periophthalmic region  . CESAREAN SECTION    . COLONOSCOPY  2011   Dr. Loreta AveMann, anemia workup  . ESOPHAGOGASTRODUODENOSCOPY  2011   Dr. Loreta AveMann  . HERNIA REPAIR     epigastric hernia repair age 46, right inguinal hernia as a child  . PARTIAL HYSTERECTOMY     abdominal due to fibroids, still has ovaries, Dr. Layne BentonBrevard  . TUBAL LIGATION    . tubal reversal  2008    Current Meds  Medication Sig    . aspirin 325 MG tablet Take 325 mg by mouth every morning.   . cyanocobalamin 1000 MCG tablet Take 1,000 mcg by mouth daily.  Marland Kitchen. ezetimibe (ZETIA) 10 MG tablet TAKE 1 TABLET (10 MG TOTAL) BY MOUTH DAILY.  . Multiple Vitamin (MULTIVITAMIN WITH MINERALS) TABS tablet Take 1 tablet by mouth daily. Reported on 04/17/2015  . rosuvastatin (CRESTOR) 40 MG tablet Take 1 tablet (40 mg total) by mouth daily.    Allergies  Allergen Reactions  . Contrast Media [Iodinated Diagnostic Agents]     For CT Angio, breaks out in hives  . Flexeril [Cyclobenzaprine]   . Lipitor [Atorvastatin Calcium] Other (See Comments)    Pt states she had a bad reaction several years ago when taking this  . Paxil [Paroxetine Hcl]     Tongue swelling had trouble swallowing   . Sulfa Antibiotics Other (See Comments)    Unknown reaction    Social History   Social History  . Marital status: Married    Spouse name: N/A  . Number of children: N/A  . Years of education: N/A   Occupational History  . TEACHER, biology high [employer]   Social History Main Topics  . Smoking status: Never Smoker  . Smokeless tobacco: Never Used  . Alcohol use No  . Drug use: No  . Sexual activity: Not Asked   Other Topics Concern  . None   Social History Narrative   Married, 2 biological children, 6 foster children, walking, calisthenics, stationary bike at least 5 days per week.   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  Wt Readings from Last 3 Encounters:  09/08/16 162 lb 6.4 oz (73.7 kg)  08/14/16  160 lb (72.6 kg)  07/18/16 161 lb 9.6 oz (73.3 kg)    PHYSICAL EXAM BP (!) 143/85   Pulse 73   Ht 5\' 4"  (1.626 m)   Wt 162 lb 6.4 oz (73.7 kg)   SpO2 100%   BMI 27.88 kg/m  General appearance: alert, cooperative, appears stated age, no distress. Overweight, but not obese. Well-nourished well-groomed. HEENT: [place]/AT, EOMI, MMM, anicteric sclera Neck: no adenopathy, no carotid bruit and no JVD Lungs: clear to auscultation bilaterally, normal percussion bilaterally and non-labored Heart: regular rate and rhythm, S1 &S2 normal, no murmur, click, rub or gallop; nondisplaced PMI. Occasional ectopy. Abdomen: soft, non-tender; bowel sounds normal; no masses,  no organomegaly; no HJR Extremities: extremities normal, atraumatic, no cyanosis, or edema  Pulses: 2+ and symmetric;  Neurologic: Mental status: Alert & oriented x 3, thought content appropriate; non-focal exam.  Pleasant  mood & affect.    Adult ECG Report  n/a  Other studies Reviewed: Additional studies/ records that were reviewed today include:  Recent Labs:  n/a     ASSESSMENT / PLAN: Problem List Items Addressed This Visit    Familial hypercholesterolemia    On Crestor and Zetia. As a monitor by her PCP. However with such a significant hyperlipidemia history and her family history of CAD we will evaluate for presence of coronary artery disease with chronic calcium scoring. From that we would go forward with either a stress test or CT angiogram if the calcium score is abnormal.      Relevant Orders   CT CARDIAC SCORING   Family history of premature CAD (Chronic)    She does have a significant family history and there are PACs and PVCs noted. We talked about potentially doing a stress test, but really with minimal risk factors I think we'll just do a coronary calcium score first.      Relevant Orders   CT CARDIAC SCORING   Heart palpitations - Primary    Preliminary view of her monitor did not show any significant PVCs or  PACs. No arrhythmias. At this I would prefer to avoid treating her. But she did have sinus rhythm with some sinus arrhythmias and sinus tachycardia but no true arrhythmias. The preliminary read suggests NSVT, but on my review this looks more like sinus tachycardia with PACs. - There was tachycardia in the setting of exercise.         Current medicines are reviewed at length with the patient today. (+/- concerns) none The following changes have been made: none  Patient Instructions  SCHEDULE AT [address] Your physician has requested that you have cardiac CT CALCIUM SCORING. Cardiac computed tomography (CT) is a painless test that uses an x-ray machine to take clear, detailed pictures of your heart. For further information please visit [email or web address] Please follow instruction sheet as given.   NO MEDICATION CHANGES    Your physician recommends that you schedule a follow-up appointment in JAN 2018 WITH DR Virtie Bungert.    Studies Ordered:   Orders Placed This Encounter  Procedures  . CT CARDIAC SCORING      Bryan Lemma, M.D., M.S. Interventional Cardiologist   Pager # [phone] Phone # [phone] [address] [place], [place] [address]

## 2016-09-10 ENCOUNTER — Encounter: Payer: Self-pay | Admitting: Cardiology

## 2016-09-10 NOTE — Assessment & Plan Note (Signed)
She does have a significant family history and there are PACs and PVCs noted. We talked about potentially doing a stress test, but really with minimal risk factors I think we'll just do a coronary calcium score first.

## 2016-09-10 NOTE — Assessment & Plan Note (Signed)
Preliminary view of her monitor did not show any significant PVCs or PACs. No arrhythmias. At this I would prefer to avoid treating her. But she did have sinus rhythm with some sinus arrhythmias and sinus tachycardia but no true arrhythmias. The preliminary read suggests NSVT, but on my review this looks more like sinus tachycardia with PACs. - There was tachycardia in the setting of exercise.

## 2016-09-10 NOTE — Assessment & Plan Note (Signed)
On Crestor and Zetia. As a monitor by her PCP. However with such a significant hyperlipidemia history and her family history of CAD we will evaluate for presence of coronary artery disease with chronic calcium scoring. From that we would go forward with either a stress test or CT angiogram if the calcium score is abnormal.

## 2016-09-24 DIAGNOSIS — Z9289 Personal history of other medical treatment: Secondary | ICD-10-CM

## 2016-09-24 HISTORY — DX: Personal history of other medical treatment: Z92.89

## 2016-10-01 ENCOUNTER — Other Ambulatory Visit: Payer: BC Managed Care – PPO

## 2016-10-03 ENCOUNTER — Ambulatory Visit (INDEPENDENT_AMBULATORY_CARE_PROVIDER_SITE_OTHER)
Admission: RE | Admit: 2016-10-03 | Discharge: 2016-10-03 | Disposition: A | Discharge status: Home / Self Care | Payer: Self-pay | Source: Ambulatory Visit | Attending: Cardiology | Admitting: Cardiology

## 2016-10-03 DIAGNOSIS — Z8249 Family history of ischemic heart disease and other diseases of the circulatory system: Secondary | ICD-10-CM

## 2016-10-03 DIAGNOSIS — E7801 Familial hypercholesterolemia: Secondary | ICD-10-CM

## 2016-11-05 IMAGING — MR MR HEAD W/O CM
10 series · 48 of 48 positions shown · non-contrast
Comparison: 04/29/2010

CLINICAL DATA: Fatigue. Prior endovascular treatment (stent
assisted coiling) of a right periophthalmic ICA aneurysm.

EXAM:
MRI HEAD WITHOUT CONTRAST
TECHNIQUE: Multiplanar, multiecho pulse sequences of the brain and surrounding
structures were obtained without intravenous contrast.

[Series 2: T1 · sagittal · 5.0mm · 0.45mm/px · 1 of 21 slices shown]
[im 1/21]
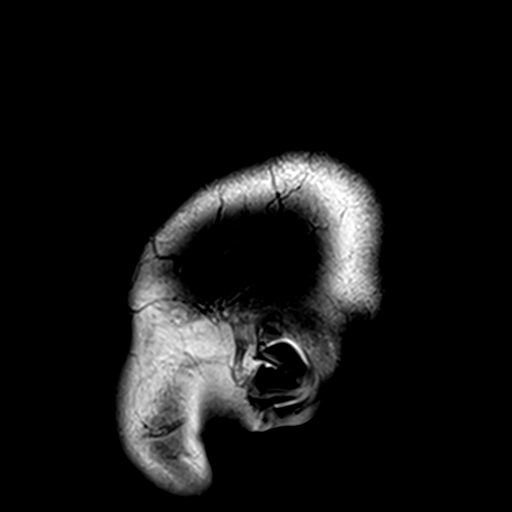

[Series 3: DWI · axial · 3.0mm · 1.80mm/px · z∈[-80,+66]mm · 10 of 100 slices shown (1 of 4)]
[im 1/100]
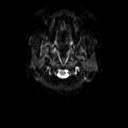
[im 12/100]
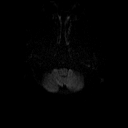
[im 23/100]
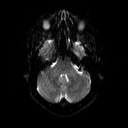
[im 34/100]
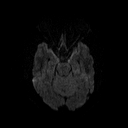
[im 45/100]
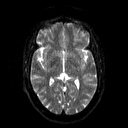
[im 56/100]
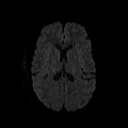
[im 67/100]
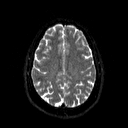
[im 78/100]
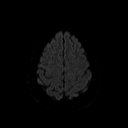
[im 89/100]
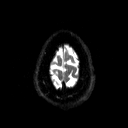
[im 100/100]
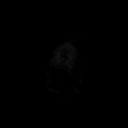

[Series 4: DWI · axial · 3.0mm · 1.80mm/px · z∈[-80,+66]mm · 5 of 50 slices shown (2 of 4)]
[im 1/50]
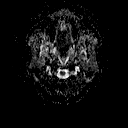
[im 13/50]
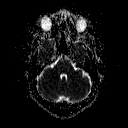
[im 25/50]
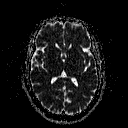
[im 37/50]
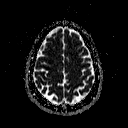
[im 50/50]
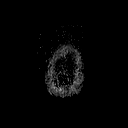

[Series 6: swi_images · axial · 2.0mm · 0.90mm/px · z∈[-85,+72]mm · 8 of 80 slices shown]
[im 1/80]
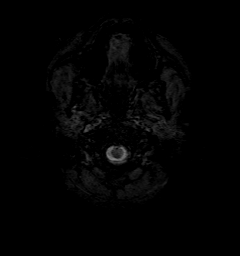
[im 12/80]
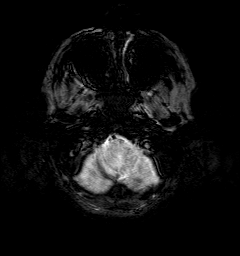
[im 23/80]
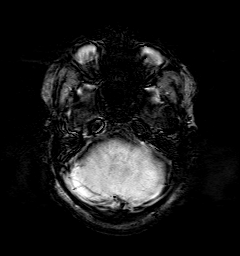
[im 34/80]
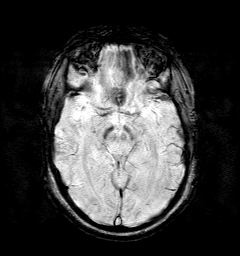
[im 46/80]
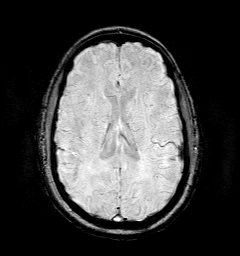
[im 57/80]
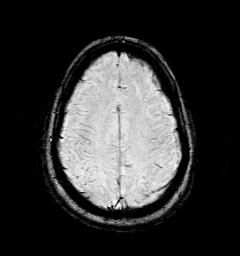
[im 68/80]
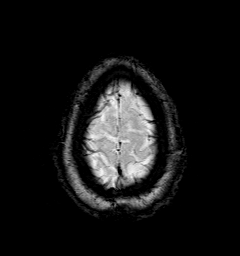
[im 80/80]
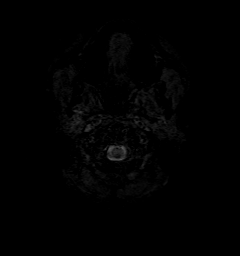

[Series 7: DWI · coronal · 5.0mm · 1.80mm/px · 6 of 67 slices shown (3 of 4)]
[im 1/67]
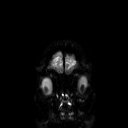
[im 14/67]
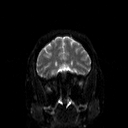
[im 27/67]
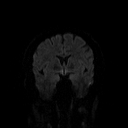
[im 40/67]
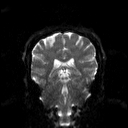
[im 53/67]
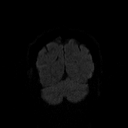
[im 67/67]
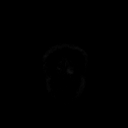

[Series 8: DWI · coronal · 5.0mm · 1.80mm/px · 3 of 34 slices shown (4 of 4)]
[im 1/34]
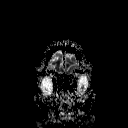
[im 17/34]
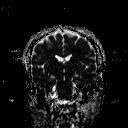
[im 34/34]
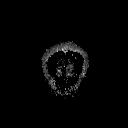

[Series 9: T2 · axial · 5.0mm · 0.51mm/px · z∈[-76,+65]mm · 2 of 22 slices shown (1 of 2)]
[im 1/22]
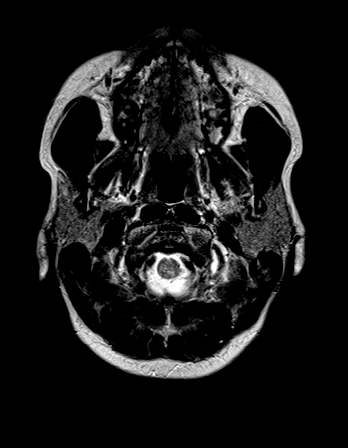
[im 22/22]
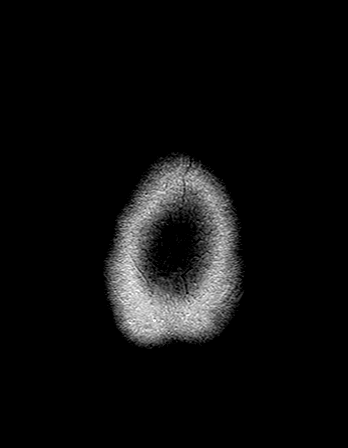

[Series 10: FLAIR · axial · 5.0mm · 0.45mm/px · z∈[-77,+64]mm · 2 of 22 slices shown]
[im 1/22]
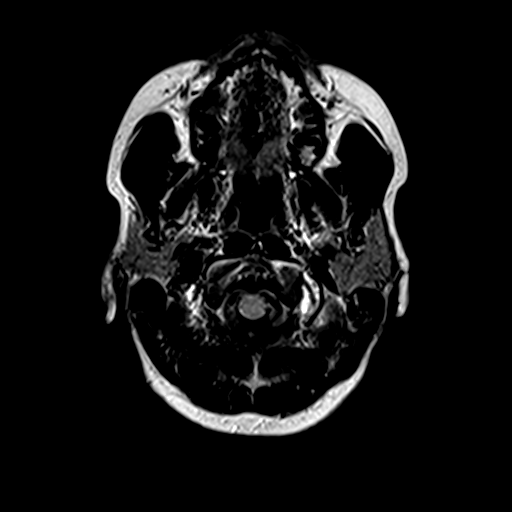
[im 22/22]
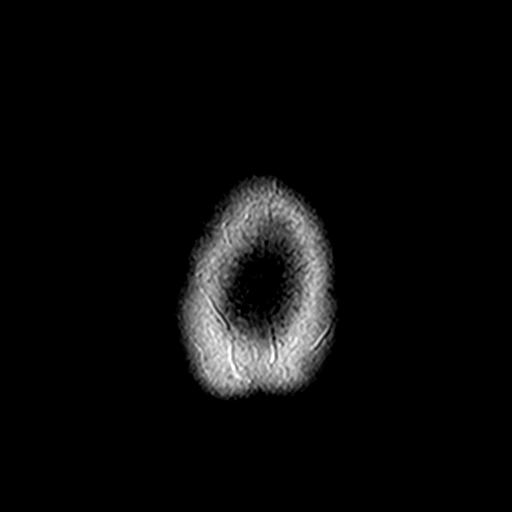

[Series 11: t1_mpr_tra · axial · 2.0mm · 0.45mm/px · z∈[-84,+73]mm · 8 of 80 slices shown]
[im 1/80]
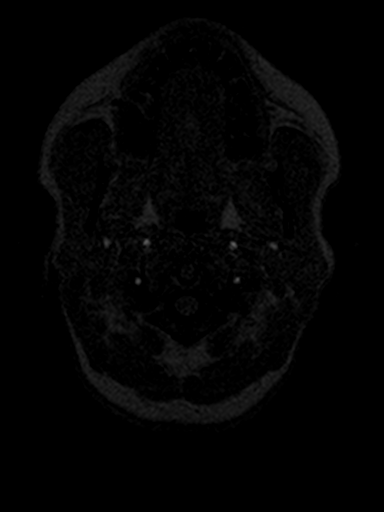
[im 12/80]
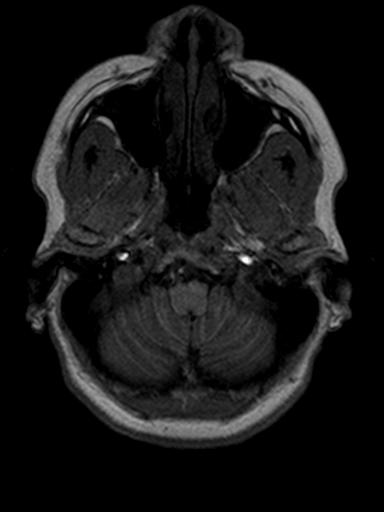
[im 23/80]
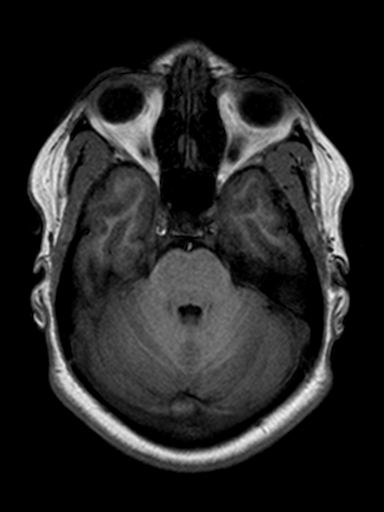
[im 34/80]
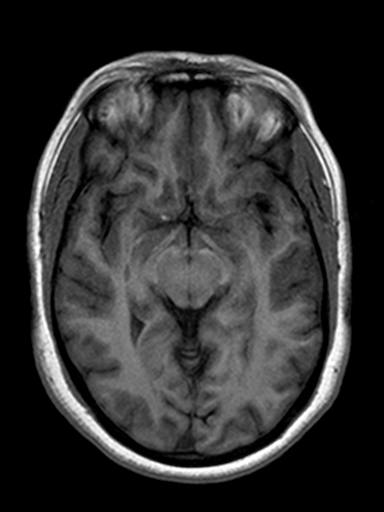
[im 46/80]
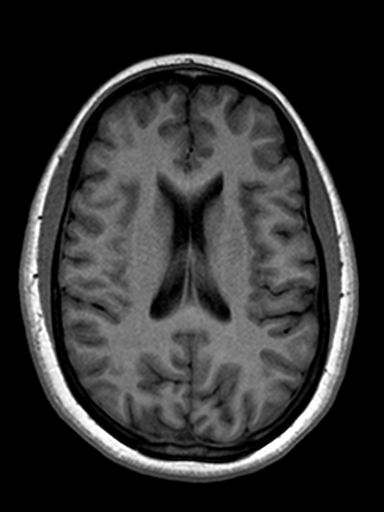
[im 57/80]
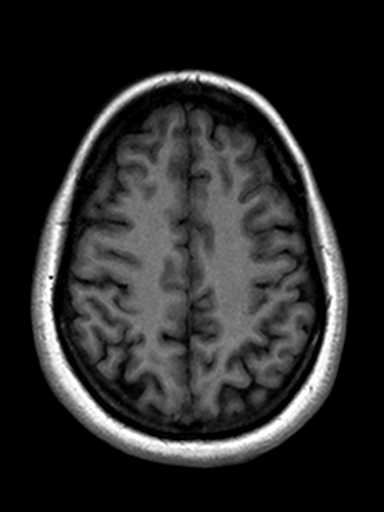
[im 68/80]
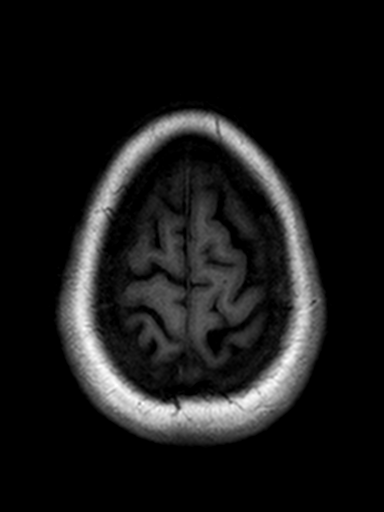
[im 80/80]
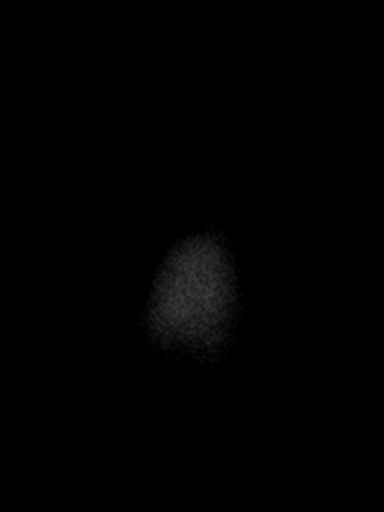

[Series 12: T2 · coronal · 5.0mm · 0.45mm/px · 3 of 27 slices shown (2 of 2)]
[im 1/27]
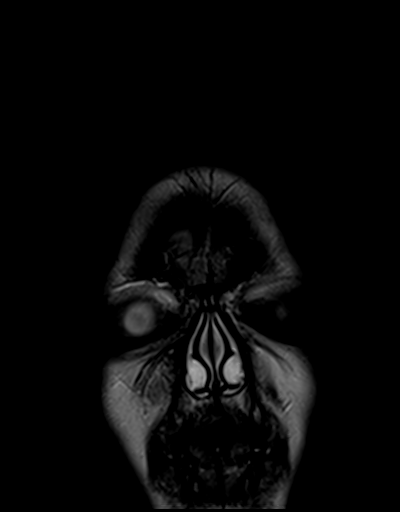
[im 14/27]
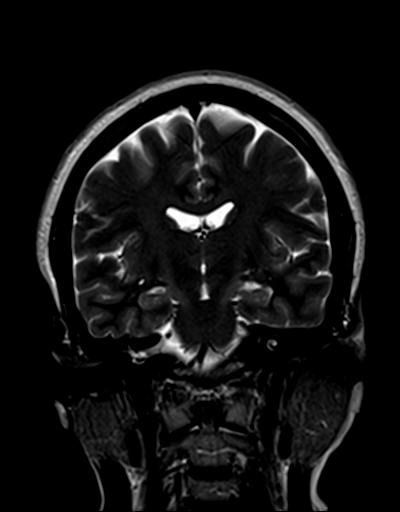
[im 27/27]
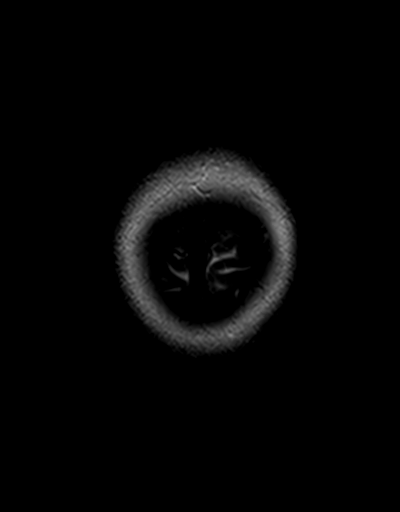

[48 of 48 positions shown; findings below may reference images not displayed]

FINDINGS: There is no evidence of acute infarct, intracranial hemorrhage,
mass, midline shift, or extra-axial fluid collection. Ventricles and
sulci are normal. The brain is normal in signal.

Orbits are unremarkable. Paranasal sinuses and mastoid air cells are
clear. Major intracranial vascular flow voids are preserved.
IMPRESSION: Unremarkable appearance of the brain.

## 2017-03-15 ENCOUNTER — Other Ambulatory Visit: Payer: Self-pay | Admitting: Medical

## 2017-05-16 ENCOUNTER — Other Ambulatory Visit: Payer: Self-pay | Admitting: Medical

## 2017-05-18 NOTE — Telephone Encounter (Signed)
LOV- 06/11/16 with you.  No further appointments made. Thanks!

## 2017-06-26 ENCOUNTER — Encounter: Payer: Self-pay | Admitting: Family Medicine

## 2017-06-26 ENCOUNTER — Encounter: Payer: Self-pay | Admitting: Medical

## 2017-06-26 ENCOUNTER — Ambulatory Visit: Payer: BC Managed Care – PPO | Admitting: Medical

## 2017-06-26 VITALS — BP 128/88 | HR 97 | Temp 98.1°F | Ht 64.25 in | Wt 159.4 lb

## 2017-06-26 DIAGNOSIS — Z7189 Other specified counseling: Secondary | ICD-10-CM | POA: Diagnosis not present

## 2017-06-26 DIAGNOSIS — E559 Vitamin D deficiency, unspecified: Secondary | ICD-10-CM

## 2017-06-26 DIAGNOSIS — Z1231 Encounter for screening mammogram for malignant neoplasm of breast: Secondary | ICD-10-CM

## 2017-06-26 DIAGNOSIS — N644 Mastodynia: Secondary | ICD-10-CM | POA: Diagnosis not present

## 2017-06-26 DIAGNOSIS — Z8249 Family history of ischemic heart disease and other diseases of the circulatory system: Secondary | ICD-10-CM | POA: Diagnosis not present

## 2017-06-26 DIAGNOSIS — Z9889 Other specified postprocedural states: Secondary | ICD-10-CM

## 2017-06-26 DIAGNOSIS — Z0000 Encounter for general adult medical examination without abnormal findings: Secondary | ICD-10-CM

## 2017-06-26 DIAGNOSIS — Z7185 Encounter for immunization safety counseling: Secondary | ICD-10-CM | POA: Insufficient documentation

## 2017-06-26 DIAGNOSIS — Z8679 Personal history of other diseases of the circulatory system: Secondary | ICD-10-CM

## 2017-06-26 DIAGNOSIS — R7301 Impaired fasting glucose: Secondary | ICD-10-CM | POA: Diagnosis not present

## 2017-06-26 DIAGNOSIS — E7801 Familial hypercholesterolemia: Secondary | ICD-10-CM | POA: Diagnosis not present

## 2017-06-26 DIAGNOSIS — Z803 Family history of malignant neoplasm of breast: Secondary | ICD-10-CM

## 2017-06-26 DIAGNOSIS — E782 Mixed hyperlipidemia: Secondary | ICD-10-CM | POA: Diagnosis not present

## 2017-06-26 DIAGNOSIS — Z1239 Encounter for other screening for malignant neoplasm of breast: Secondary | ICD-10-CM

## 2017-06-26 DIAGNOSIS — E78019 Familial hypercholesterolemia, unspecified: Secondary | ICD-10-CM

## 2017-06-26 LAB — POCT URINALYSIS DIP (PROADVANTAGE DEVICE)
Bilirubin, UA: NEGATIVE
Blood, UA: NEGATIVE
Glucose, UA: NEGATIVE mg/dL
Ketones, POC UA: NEGATIVE mg/dL
Leukocytes, UA: NEGATIVE
Nitrite, UA: NEGATIVE
Protein Ur, POC: NEGATIVE mg/dL
pH, UA: 6

## 2017-06-26 NOTE — Patient Instructions (Addendum)
Thanks for trusting [place] with your health care and for coming in for a physical today.  Below are some general recommendations I have for you:  Yearly screenings See your eye doctor yearly for routine vision care. See your dentist yearly for routine dental care including hygiene visits twice yearly. See me here yearly for a routine physical and preventative care visit  You will be due for colonoscopy at age 47  Shingles vaccine:  I recommend you have a shingles vaccine to help prevent shingles or herpes zoster outbreak.   Please call your insurer to inquire about coverage for the Shingrix vaccine given in 2 doses.   Some insurers cover this vaccine after age 67, some cover this after age 64.  If your insurer covers this, then call to schedule appointment to have this vaccine here.  We will call with lab results  We will call with schedule for diagnostic mammogram   Please follow up yearly for a physical.    I have included other useful information below for your review.  Preventative Care for Adults - Female      MAINTAIN REGULAR HEALTH EXAMS:  A routine yearly physical is a good way to check in with your primary care provider about your health and preventive screening. It is also an opportunity to share updates about your health and any concerns you have, and receive a thorough all-over exam.   Most health insurance companies pay for at least some preventative services.  Check with your health plan for specific coverages.  WHAT PREVENTATIVE SERVICES DO WOMEN NEED?  Adult women should have their weight and blood pressure checked regularly.   Women age 20 and older should have their cholesterol levels checked regularly.  Women should be screened for cervical cancer with a Pap smear and pelvic exam beginning at either age 61, or 3 years after they become sexually activity.    Breast cancer screening generally begins at age 28 with a mammogram and breast exam by your primary care  provider.    Beginning at age 37 and continuing to age 74, women should be screened for colorectal cancer.  Certain people may need continued testing until age 73.  Updating vaccinations is part of preventative care.  Vaccinations help protect against diseases such as the flu.  Osteoporosis is a disease in which the bones lose minerals and strength as we age. Women ages 19 and over should discuss this with their caregivers, as should women after menopause who have other risk factors.  Lab tests are generally done as part of preventative care to screen for anemia and blood disorders, to screen for problems with the kidneys and liver, to screen for bladder problems, to check blood sugar, and to check your cholesterol level.  Preventative services generally include counseling about diet, exercise, avoiding tobacco, drugs, excessive alcohol consumption, and sexually transmitted infections.    GENERAL RECOMMENDATIONS FOR GOOD HEALTH:  Healthy diet:  Eat a variety of foods, including fruit, vegetables, animal or vegetable protein, such as meat, fish, chicken, and eggs, or beans, lentils, tofu, and grains, such as rice.  Drink plenty of water daily.  Decrease saturated fat in the diet, avoid lots of red meat, processed foods, sweets, fast foods, and fried foods.  Exercise:  Aerobic exercise helps maintain good heart health. At least 30-40 minutes of moderate-intensity exercise is recommended. For example, a brisk walk that increases your heart rate and breathing. This should be done on most days of the week.  Find a type of exercise or a variety of exercises that you enjoy so that it becomes a part of your daily life.  Examples are running, walking, swimming, water aerobics, and biking.  For motivation and support, explore group exercise such as aerobic class, spin class, Zumba, Yoga,or  martial arts, etc.    Set exercise goals for yourself, such as a certain weight goal, walk or run in a race  such as a 5k walk/run.  Speak to your primary care provider about exercise goals.  Disease prevention:  If you smoke or chew tobacco, find out from your caregiver how to quit. It can literally save your life, no matter how long you have been a tobacco user. If you do not use tobacco, never begin.   Maintain a healthy diet and normal weight. Increased weight leads to problems with blood pressure and diabetes.   The Body Mass Index or BMI is a way of measuring how much of your body is fat. Having a BMI above 27 increases the risk of heart disease, diabetes, hypertension, stroke and other problems related to obesity. Your caregiver can help determine your BMI and based on it develop an exercise and dietary program to help you achieve or maintain this important measurement at a healthful level.  High blood pressure causes heart and blood vessel problems.  Persistent high blood pressure should be treated with medicine if weight loss and exercise do not work.   Fat and cholesterol leaves deposits in your arteries that can block them. This causes heart disease and vessel disease elsewhere in your body.  If your cholesterol is found to be high, or if you have heart disease or certain other medical conditions, then you may need to have your cholesterol monitored frequently and be treated with medication.   Ask if you should have a cardiac stress test if your history suggests this. A stress test is a test done on a treadmill that looks for heart disease. This test can find disease prior to there being a problem.  Menopause can be associated with physical symptoms and risks. Hormone replacement therapy is available to decrease these. You should talk to your caregiver about whether starting or continuing to take hormones is right for you.   Osteoporosis is a disease in which the bones lose minerals and strength as we age. This can result in serious bone fractures. Risk of osteoporosis can be identified using a  bone density scan. Women ages 110 and over should discuss this with their caregivers, as should women after menopause who have other risk factors. Ask your caregiver whether you should be taking a calcium supplement and Vitamin D, to reduce the rate of osteoporosis.   Avoid drinking alcohol in excess (more than two drinks per day).  Avoid use of street drugs. Do not share needles with anyone. Ask for professional help if you need assistance or instructions on stopping the use of alcohol, cigarettes, and/or drugs.  Brush your teeth twice a day with fluoride toothpaste, and floss once a day. Good oral hygiene prevents tooth decay and gum disease. The problems can be painful, unattractive, and can cause other health problems. Visit your dentist for a routine oral and dental check up and preventive care every 6-12 months.   Look at your skin regularly.  Use a mirror to look at your back. Notify your caregivers of changes in moles, especially if there are changes in shapes, colors, a size larger than a pencil eraser, an irregular  border, or development of new moles.  Safety:  Use seatbelts 100% of the time, whether driving or as a passenger.  Use safety devices such as hearing protection if you work in environments with loud noise or significant background noise.  Use safety glasses when doing any work that could send debris in to the eyes.  Use a helmet if you ride a bike or motorcycle.  Use appropriate safety gear for contact sports.  Talk to your caregiver about gun safety.  Use sunscreen with a SPF (or skin protection factor) of 15 or greater.  Lighter skinned people are at a greater risk of skin cancer. Don't forget to also wear sunglasses in order to protect your eyes from too much damaging sunlight. Damaging sunlight can accelerate cataract formation.   Practice safe sex. Use condoms. Condoms are used for birth control and to help reduce the spread of sexually transmitted infections (or STIs).  Some  of the STIs are gonorrhea (the clap), chlamydia, syphilis, trichomonas, herpes, HPV (human papilloma virus) and HIV (human immunodeficiency virus) which causes AIDS. The herpes, HIV and HPV are viral illnesses that have no cure. These can result in disability, cancer and death.   Keep carbon monoxide and smoke detectors in your home functioning at all times. Change the batteries every 6 months or use a model that plugs into the wall.   Vaccinations:  Stay up to date with your tetanus shots and other required immunizations. You should have a booster for tetanus every 10 years. Be sure to get your flu shot every year, since 5%-20% of the [place] population comes down with the flu. The flu vaccine changes each year, so being vaccinated once is not enough. Get your shot in the fall, before the flu season peaks.   Other vaccines to consider:  Human Papilloma Virus or HPV causes cancer of the cervix, and other infections that can be transmitted from person to person. There is a vaccine for HPV, and females should get immunized between the ages of 44 and 87. It requires a series of 3 shots.   Pneumococcal vaccine to protect against certain types of pneumonia.  This is normally recommended for adults age 62 or older.  However, adults younger than 49 years old with certain underlying conditions such as diabetes, heart or lung disease should also receive the vaccine.  Shingles vaccine to protect against Varicella Zoster if you are older than age 6, or younger than 49 years old with certain underlying illness.  If you have not had the Shingrix vaccine, please call your insurer to inquire about coverage for the Shingrix vaccine given in 2 doses.   Some insurers cover this vaccine after age 47, some cover this after age 17.  If your insurer covers this, then call to schedule appointment to have this vaccine here  Hepatitis A vaccine to protect against a form of infection of the liver by a virus acquired from  food.  Hepatitis B vaccine to protect against a form of infection of the liver by a virus acquired from blood or body fluids, particularly if you work in health care.  If you plan to travel internationally, check with your local health department for specific vaccination recommendations.  Cancer Screening:  Breast cancer screening is essential to preventive care for women. All women age 67 and older should perform a breast self-exam every month. At age 57 and older, women should have their caregiver complete a breast exam each year. Women at ages 80  and older should have a mammogram (x-ray film) of the breasts. Your caregiver can discuss how often you need mammograms.    Cervical cancer screening includes taking a Pap smear (sample of cells examined under a microscope) from the cervix (end of the uterus). It also includes testing for HPV (Human Papilloma Virus, which can cause cervical cancer). Screening and a pelvic exam should begin at age 54, or 3 years after a woman becomes sexually active. Screening should occur every year, with a Pap smear but no HPV testing, up to age 61. After age 83, you should have a Pap smear every 3 years with HPV testing, if no HPV was found previously.   Most routine colon cancer screening begins at the age of 39. On a yearly basis, doctors may provide special easy to use take-home tests to check for hidden blood in the stool. Sigmoidoscopy or colonoscopy can detect the earliest forms of colon cancer and is life saving. These tests use a small camera at the end of a tube to directly examine the colon. Speak to your caregiver about this at age 41, when routine screening begins (and is repeated every 5 years unless early forms of pre-cancerous polyps or small growths are found).

## 2017-06-26 NOTE — Progress Notes (Signed)
Subjective:   HPI  Debbie [name] is a 47 y.o. female who presents for Chief Complaint  Patient presents with  . Annual Exam    Medical care team includes: Kristian Covey PA-C, here for primary care [facility]ist [facility]  Concerns: Not having headaches for a while.  Doing ok on this.  Exercises 2-3 days per week with walking or biking or elliptical.  Does strength training as well.   Gynecological history: 4 pregnancies, 2 live births, 2 miscarriages. S/p hysterectomy for fibroids  Breasts have been quite sore lately.   Seems to be tender every few months.     Reviewed their medical, surgical, family, social, medication, and allergy history and updated chart as appropriate.  Past Medical History:  Diagnosis Date  . Anemia    history of  . Aneurysm of carotid artery (HCC)    hx/o - coiling 02/07/2010  . Chronic headache    since aneurysm diagnosis. mostly related to constipation as of 2017  . Edema   . Familial hypercholesteremia    LDL over 300; lipoprotein testing 01/2009  . Family history of heart disease in female family member before age 65    LDL over 300 prior  . H/O cardiovascular stress test 12/08/05   fair exercise capacity, no ST segment changes suggestive of ischemia  . H/O echocardiogram 03/22/01   mild asymmetric LVH, mild aortic sclerosis, mild mitral regurgitation  . History of blood transfusion    x2 due to heavy bleeding and anemia from fibroids  . Hx of CT scan of chest 09/2016   Coronary calcium score of 15. This was 11 percentile for age and sex  . S/P partial hysterectomy    due to uterine fibroids  . 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 Nigel Sloop)  . Vitamin D deficiency   . Wears glasses     Past Surgical History:  Procedure Laterality Date  . ANEURYSM COILING  02/07/10   stent assisted coiling of right internal carotid artery aneurysm in  periophthalmic region  . CESAREAN SECTION    . COLONOSCOPY  2011   Dr. Loreta Ave, anemia workup  . ESOPHAGOGASTRODUODENOSCOPY  2011   Dr. Loreta Ave  . HERNIA REPAIR     epigastric hernia repair age 62, right inguinal hernia as a child  . PARTIAL HYSTERECTOMY     abdominal due to fibroids, still has ovaries, Dr. Layne Benton  . TUBAL LIGATION    . tubal reversal  2008    Social History   Socioeconomic History  . Marital status: Married    Spouse name: Not on file  . Number of children: Not on file  . Years of education: Not on file  . Highest education level: Not on file  Occupational History  . Occupation: Runner, broadcasting/film/video, biology high school    Employer: [employer]  Social Needs  . Financial resource strain: Not on file  . Food insecurity:    Worry: Not on file    Inability: Not on file  . Transportation needs:    Medical: Not on file    Non-medical: Not on file  Tobacco Use  . Smoking status: Never Smoker  . Smokeless tobacco: Never Used  Substance and Sexual Activity  . Alcohol use: No  . Drug use: No  . Sexual activity: Not on file  Lifestyle  . Physical activity:    Days per week: Not on file  Minutes per session: Not on file  . Stress: Not on file  Relationships  . Social connections:    Talks on phone: Not on file    Gets together: Not on file    Attends religious service: Not on file    Active member of club or organization: Not on file    Attends meetings of clubs or organizations: Not on file    Relationship status: Not on file  . Intimate partner violence:    Fear of current or ex partner: Not on file    Emotionally abused: Not on file    Physically abused: Not on file    Forced sexual activity: Not on file  Other Topics Concern  . Not on file  Social History Narrative   Married, 2 biological children, 6 foster children, walking, calisthenics, stationary bike at least 5 days per week.   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   06/2017    Family  History  Problem Relation Age of Onset  . Heart failure Sister   . Heart disease Sister 59       stents - LAD  . Hypertension Sister   . Aneurysm Sister   . Hypertension Mother   . Obesity Mother   . Cancer Mother        breast  . Heart disease Father 67       hx/o CABG w/ redo, stening, died of MI at age 66yo  . Diabetes Father   . Hyperlipidemia Father   . Heart failure Other   . Cancer Other   . Cancer Maternal Grandmother        breast  . Stroke Maternal Grandfather   . Cancer Paternal Grandfather        prostate cancer     Current Outpatient Medications:  .  aspirin 325 MG tablet, Take 325 mg by mouth every morning. , Disp: , Rfl:  .  CRESTOR 40 MG tablet, TAKE 1 TABLET (40 MG TOTAL) BY MOUTH DAILY., Disp: 90 tablet, Rfl: 0 .  cyanocobalamin 1000 MCG tablet, Take 1,000 mcg by mouth daily., Disp: , Rfl:  .  Multiple Vitamin (MULTIVITAMIN WITH MINERALS) TABS tablet, Take 1 tablet by mouth daily. Reported on 04/17/2015, Disp: , Rfl:  .  ezetimibe (ZETIA) 10 MG tablet, TAKE 1 TABLET (10 MG TOTAL) BY MOUTH DAILY., Disp: 90 tablet, Rfl: 0  Allergies  Allergen Reactions  . Contrast Media [Iodinated Diagnostic Agents]     For CT Angio, breaks out in hives  . Flexeril [Cyclobenzaprine]   . Lipitor [Atorvastatin Calcium] Other (See Comments)    Pt states she had a bad reaction several years ago when taking this  . Paxil [Paroxetine Hcl]     Tongue swelling had trouble swallowing   . Sulfa Antibiotics Other (See Comments)    Unknown reaction     Review of Systems Constitutional: -fever, -chills, -sweats, -unexpected weight change, -decreased appetite, -fatigue Allergy: -sneezing, -itching, -congestion Dermatology: -changing moles, --rash, -lumps ENT: -runny nose, -ear pain, -sore throat, -hoarseness, -sinus pain, -teeth pain, - ringing in ears, -hearing loss, -nosebleeds Cardiology: -chest pain, -palpitations, -swelling, -difficulty breathing when lying flat, -waking up  short of breath Respiratory: -cough, -shortness of breath, -difficulty breathing with exercise or exertion, -wheezing, -coughing up blood Gastroenterology: -abdominal pain, -nausea, -vomiting, -diarrhea, -constipation, -blood in stool, -changes in bowel movement, -difficulty swallowing or eating Hematology: -bleeding, -bruising  Musculoskeletal: -joint aches, -muscle aches, -joint swelling, -back pain, -neck pain, -cramping, -changes in  gait Ophthalmology: denies vision changes, eye redness, itching, discharge Urology: -burning with urination, -difficulty urinating, -blood in urine, -urinary frequency, -urgency, -incontinence Neurology: -headache, -weakness, -tingling, -numbness, -memory loss, -falls, -dizziness Psychology: -depressed mood, -agitation, -sleep problems Breast/gyn: -breast tenderness, -discharge, -lumps, -vaginal discharge,- irregular periods, -heavy periods     Objective:  BP 128/88   Pulse 97   Temp 98.1 F (36.7 C) (Oral)   Ht 5' 4.25" (1.632 m)   Wt 159 lb 6.4 oz (72.3 kg)   SpO2 99%   BMI 27.15 kg/m   Wt Readings from Last 3 Encounters:  06/26/17 159 lb 6.4 oz (72.3 kg)  09/08/16 162 lb 6.4 oz (73.7 kg)  08/14/16 160 lb (72.6 kg)    General appearance: alert, no distress, WD/WN, African American female Skin: unremarkable, no worrisome lesions HEENT: normocephalic, conjunctiva/corneas normal, sclerae anicteric, PERRLA, EOMi, nares patent, no discharge or erythema, pharynx normal Oral cavity: MMM, tongue normal, teeth in good repair Neck: supple, no lymphadenopathy, no thyromegaly, no masses, normal ROM, no bruits Chest: non tender, normal shape and expansion Heart: RRR, normal S1, S2, no murmurs Lungs: CTA bilaterally, no wheezes, rhonchi, or rales Abdomen: +bs, soft, non tender, non distended, no masses, no hepatomegaly, no splenomegaly, no bruits Back: non tender, normal ROM, no scoliosis Musculoskeletal: upper extremities non tender, no obvious deformity,  normal ROM throughout, lower extremities non tender, no obvious deformity, normal ROM throughout Extremities: no edema, no cyanosis, no clubbing Pulses: 2+ symmetric, upper and lower extremities, normal cap refill Neurological: alert, oriented x 3, CN2-12 intact, strength normal upper extremities and lower extremities, sensation normal throughout, DTRs 2+ throughout, no cerebellar signs, gait normal Psychiatric: normal affect, behavior normal, pleasant  Breast: mild bilat tenderness, small 3mm nodule left upper breast at 12oclock, similar 2-19mm diameter nodule right breast at 8 o'clock, no masses or lumps, no skin changes, no nipple discharge or inversion, no axillary lymphadenopathy Gyn: Normal external genitalia without lesions, vagina with normal mucosa, s/p hysterectomy, no abnormal vaginal discharge.  adnexa not enlarged, nontender, no masses.    Exam chaperoned by nurse. Rectal: deferred   Assessment and Plan :   Encounter Diagnoses  Name Primary?  . Encounter for health maintenance examination in adult Yes  . Impaired fasting blood sugar   . History of cerebral aneurysm repair   . Family history of premature CAD   . Family history of aneurysm   . Family history of heart disease in female family member before age 42   . Mixed dyslipidemia   . Familial hypercholesterolemia   . Vitamin D deficiency   . Family history of breast cancer   . Screening for breast cancer   . Vaccine counseling   . Breast tenderness     Physical exam - discussed and counseled on healthy lifestyle, diet, exercise, preventative care, vaccinations, sick and well care, proper use of emergency dept and after hours care, and addressed their concerns.    Health screening: See your eye doctor yearly for routine vision care. See your dentist yearly for routine dental care including hygiene visits twice yearly.  Cancer screening Discussed and advised monthly self breast exams Discussed mammogram, advised  mammogram   Colonoscopy:  Advised at age 6yo  Vaccinations: Advised yearly influenza vaccine Shingles vaccine:  I recommend you have a shingles vaccine to help prevent shingles or herpes zoster outbreak.   Please call your insurer to inquire about coverage for the Shingrix vaccine given in 2 doses.   Some insurers cover  this vaccine after age 61, some cover this after age 31.  If your insurer covers this, then call to schedule appointment to have this vaccine here. Due for Td 2020  Acute issues discussed: Breast tenderness - go for mammogram I will call back with answers on safety for air travel given hx/o aneurysm.  Separate significant chronic issues discussed: Dyslipidemia - f/u pending labs.   Plan for lifelong statin C/t healthy diet and regular exercise Vit D deficiency - lab today, c/t supplementation, seafood in diet. Reviewed 06/2016 cardiology evaluation notes.    She will see Dr. Herbie Baltimore this year for a year f/u She saw dermatology in the past year for surveillance.  Completed form for child care/foster parent   Kaylani was seen today for annual exam.  Diagnoses and all orders for this visit:  Encounter for health maintenance examination in adult -     Comprehensive metabolic panel -     CBC -     Lipid panel -     Hemoglobin A1c -     VITAMIN D 25 Hydroxy (Vit-D Deficiency, Fractures) -     POCT Urinalysis DIP (Proadvantage Device)  Impaired fasting blood sugar  History of cerebral aneurysm repair  Family history of premature CAD  Family history of aneurysm  Family history of heart disease in female family member before age 30  Mixed dyslipidemia -     Lipid panel  Familial hypercholesterolemia  Vitamin D deficiency -     VITAMIN D 25 Hydroxy (Vit-D Deficiency, Fractures)  Family history of breast cancer  Screening for breast cancer -     Cancel: MM DIGITAL SCREENING BILATERAL; Future -     MM Digital Diagnostic Bilat; Future  Vaccine  counseling  Breast tenderness -     MM Digital Diagnostic Bilat; Future   Follow-up pending labs, yearly for physical

## 2017-06-27 LAB — COMPREHENSIVE METABOLIC PANEL
ALT: 10 IU/L (ref 0–32)
AST: 15 IU/L (ref 0–40)
Albumin/Globulin Ratio: 1.8 (ref 1.2–2.2)
Albumin: 4.3 g/dL (ref 3.5–5.5)
Alkaline Phosphatase: 61 IU/L (ref 39–117)
BUN/Creatinine Ratio: 12 (ref 9–23)
BUN: 9 mg/dL (ref 6–24)
Bilirubin Total: 0.6 mg/dL (ref 0.0–1.2)
CO2: 22 mmol/L (ref 20–29)
Calcium: 9.6 mg/dL (ref 8.7–10.2)
Chloride: 105 mmol/L (ref 96–106)
Creatinine, Ser: 0.74 mg/dL (ref 0.57–1.00)
GFR calc Af Amer: 110 mL/min/{1.73_m2} (ref >59)
GFR calc non Af Amer: 95 mL/min/{1.73_m2} (ref >59)
Globulin, Total: 2.4 g/dL (ref 1.5–4.5)
Glucose: 85 mg/dL (ref 65–99)
Potassium: 4.9 mmol/L (ref 3.5–5.2)
Sodium: 141 mmol/L (ref 134–144)
Total Protein: 6.7 g/dL (ref 6.0–8.5)

## 2017-06-27 LAB — HEMOGLOBIN A1C
Est. average glucose Bld gHb Est-mCnc: 128 mg/dL
Hgb A1c MFr Bld: 6.1 % — ABNORMAL HIGH (ref 4.8–5.6)

## 2017-06-27 LAB — CBC
Hematocrit: 41 % (ref 34.0–46.6)
Hemoglobin: 13.4 g/dL (ref 11.1–15.9)
MCH: 29.2 pg (ref 26.6–33.0)
MCHC: 32.7 g/dL (ref 31.5–35.7)
MCV: 89 fL (ref 79–97)
Platelets: 313 10*3/uL (ref 150–379)
RBC: 4.59 x10E6/uL (ref 3.77–5.28)
RDW: 13.3 % (ref 12.3–15.4)
WBC: 8 10*3/uL (ref 3.4–10.8)

## 2017-06-27 LAB — LIPID PANEL
Chol/HDL Ratio: 6.8 ratio — ABNORMAL HIGH (ref 0.0–4.4)
Cholesterol, Total: 332 mg/dL — ABNORMAL HIGH (ref 100–199)
HDL: 49 mg/dL (ref >39)
LDL Calculated: 265 mg/dL — ABNORMAL HIGH (ref 0–99)
Triglycerides: 90 mg/dL (ref 0–149)
VLDL Cholesterol Cal: 18 mg/dL (ref 5–40)

## 2017-06-27 LAB — VITAMIN D 25 HYDROXY (VIT D DEFICIENCY, FRACTURES): Vit D, 25-Hydroxy: 25 ng/mL — ABNORMAL LOW (ref 30.0–100.0)

## 2017-06-29 ENCOUNTER — Other Ambulatory Visit: Payer: Self-pay | Admitting: Medical

## 2017-06-29 MED ORDER — ROSUVASTATIN CALCIUM 40 MG PO TABS: ORAL_TABLET | ORAL | 3 refills | Status: DC

## 2017-06-29 MED ORDER — VITAMIN D 1000 UNITS PO TABS: 1000.0000 [IU] | ORAL_TABLET | Freq: Every day | ORAL | 3 refills | Status: DC

## 2017-06-29 MED ORDER — EZETIMIBE 10 MG PO TABS: ORAL_TABLET | ORAL | 3 refills | Status: DC

## 2017-06-29 MED ORDER — ASPIRIN EC 81 MG PO TBEC: 81.0000 mg | DELAYED_RELEASE_TABLET | Freq: Every day | ORAL | 3 refills | Status: DC

## 2017-07-17 ENCOUNTER — Other Ambulatory Visit: Payer: Self-pay | Admitting: Medical

## 2017-07-29 ENCOUNTER — Telehealth: Payer: Self-pay | Admitting: Medical

## 2017-07-29 NOTE — Telephone Encounter (Signed)
P.A. Gasper Lloydrestor brand

## 2017-07-30 NOTE — Telephone Encounter (Signed)
P.A. recv'd denial due to error Called [facility] T# [phone] they will reprocess

## 2017-08-04 ENCOUNTER — Encounter: Payer: Self-pay | Admitting: Medical

## 2017-08-04 NOTE — Telephone Encounter (Signed)
Appeal letter typed and faxed and left message for pt

## 2017-08-04 NOTE — Telephone Encounter (Signed)
P.A. Gibson Rampenied, due to needing documentation of trial and failure of 3 alternatives.  [employer] [place] Appeals fax# [phone] t# [phone]

## 2017-08-10 ENCOUNTER — Other Ambulatory Visit: Payer: Self-pay | Admitting: Medical

## 2017-08-10 NOTE — Telephone Encounter (Signed)
Called appeals dept [phone] and still pending and will receive response within 2 days.  Left message for pt

## 2017-08-11 ENCOUNTER — Other Ambulatory Visit: Payer: Self-pay | Admitting: Medical

## 2017-08-11 NOTE — Telephone Encounter (Signed)
Is this ok to refill?  

## 2017-08-11 NOTE — Telephone Encounter (Signed)
[facility] needs directions and dose, Qty and refills sent to them.

## 2017-08-20 NOTE — Telephone Encounter (Signed)
Appeal approved til 08/13/18, pt informed

## 2017-11-06 ENCOUNTER — Other Ambulatory Visit: Payer: Self-pay | Admitting: Medical

## 2017-11-30 ENCOUNTER — Other Ambulatory Visit: Payer: Self-pay | Admitting: Medical

## 2017-11-30 NOTE — Telephone Encounter (Signed)
Is this ok to refill?  

## 2017-12-10 ENCOUNTER — Other Ambulatory Visit: Payer: Self-pay | Admitting: Medical

## 2017-12-10 ENCOUNTER — Ambulatory Visit: Payer: BC Managed Care – PPO | Admitting: Medical

## 2017-12-10 VITALS — BP 140/90 | HR 72 | Wt 149.0 lb

## 2017-12-10 DIAGNOSIS — R4589 Other symptoms and signs involving emotional state: Secondary | ICD-10-CM | POA: Insufficient documentation

## 2017-12-10 DIAGNOSIS — Z630 Problems in relationship with spouse or partner: Secondary | ICD-10-CM | POA: Diagnosis not present

## 2017-12-10 DIAGNOSIS — F329 Major depressive disorder, single episode, unspecified: Secondary | ICD-10-CM

## 2017-12-10 NOTE — Patient Instructions (Signed)
RESOURCES in [place], [place]  If you are experiencing a mental health crisis or an emergency, please call 911 or go to the nearest emergency department.  [facility]   [phone] [facility]  [phone] [facility]   [phone]  Suicide Hotline 1-800-Suicide (1-800-784-2433)  National Suicide Prevention Lifeline 1-800-273-TALK  (1-800-273-8255)  Domestic Violence, Rape/Crisis - [facility] 336-273-7273  The National Domestic Violence Hotline 1-800-799-SAFE (1-800-799-7233)  To report Child or Elder Abuse, please call: [place] Police Department  [phone] [place] Sherriff Department  [phone]  LGBT Youth Crisis Line 1-866-488-7386  Teen Crisis line 336-387-6161 or 1-877-332-7333     Psychiatry and Counseling services  [facility] [address], [place], [place] [address] [phone]  Holly Ingram, therapist Dr. Carey Cottle, psychiatrist Dr. Glenn Jennings, child psychiatrist   Dr. Laqueena Hinchey Fuller [address], [place], [place] [address] [phone]   Dr. Rupinder Kaur, psychiatry [address], [place], [place] [address] [phone]   [facility] [address], [place], [place] [address] [phone]   [facility] [address], [place], [place] [address] [phone]    Counseling Services (NON- psychiatrist offices)  [facility] [address], [place], [place] [address] [phone]   [facility] [phone] [address], [place], [place] [address]   [facility] [phone]  [email or web address] [address], [place], [place] [address]   Merrianne M. Leff, therapist [phone] [address] [place], [place] [address]   [facility] [phone] [address], [place], [place]  [address]   Jill White-Huffman, therapist [phone] [address], ,  [address]   [facility] [phone] [address], ,  [address]   
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
## 2017-12-16 ENCOUNTER — Ambulatory Visit: Payer: BC Managed Care – PPO | Admitting: Psychology

## 2017-12-16 DIAGNOSIS — F4321 Adjustment disorder with depressed mood: Secondary | ICD-10-CM | POA: Diagnosis not present

## 2017-12-23 ENCOUNTER — Ambulatory Visit: Payer: BC Managed Care – PPO | Admitting: Psychology

## 2017-12-23 DIAGNOSIS — F4321 Adjustment disorder with depressed mood: Secondary | ICD-10-CM | POA: Diagnosis not present

## 2017-12-30 ENCOUNTER — Ambulatory Visit: Payer: BC Managed Care – PPO | Admitting: Psychology

## 2018-03-24 ENCOUNTER — Encounter: Payer: Self-pay | Admitting: Medical

## 2018-03-24 ENCOUNTER — Ambulatory Visit: Payer: BC Managed Care – PPO | Admitting: Medical

## 2018-03-24 VITALS — BP 160/82 | HR 82 | Temp 98.0°F | Ht 64.0 in | Wt 149.2 lb

## 2018-03-24 DIAGNOSIS — J988 Other specified respiratory disorders: Secondary | ICD-10-CM

## 2018-03-24 DIAGNOSIS — R030 Elevated blood-pressure reading, without diagnosis of hypertension: Secondary | ICD-10-CM | POA: Diagnosis not present

## 2018-03-24 DIAGNOSIS — R05 Cough: Secondary | ICD-10-CM

## 2018-03-24 DIAGNOSIS — R059 Cough, unspecified: Secondary | ICD-10-CM

## 2018-03-24 MED ORDER — AMOXICILLIN 400 MG/5ML PO SUSR: 800.0000 mg | Freq: Two times a day (BID) | ORAL | 0 refills | Status: DC

## 2018-03-24 NOTE — Patient Instructions (Addendum)
In general, avoid "decongestants" such as Sudafed, Phenylephrine, or any other sinus decongestants  Avoid over the counter cold medications with "D" in the name.     It would be safe for you to use: Coricidin HBP Mucinex DM Benadryl    Begin Amoxicillin x 10 days Rest Drink plenty of water Use warm fluids, salt water gargles, Chloraseptic for throat pain Consider nasal saline flush to flush out mucous from nose Use some tylenol or ibuprofen for throat pain

## 2018-03-24 NOTE — Progress Notes (Signed)
Subjective: Chief Complaint  Patient presents with  . Sinus Problem   Here for illness.  Started a week ago with chills, then sweats, feverish, cough, sore throat, hurts to swallow.  Ear hurts at times with Swallowing.  coughing a lot.  Symptoms have not improved at all.  She is using Mucinex, over-the-counter decongestant.  No sick contacts.  No other aggravating or relieving factors. No other complaint.   Past Medical History:  Diagnosis Date  . Anemia    history of  . Aneurysm of carotid artery (HCC)    hx/o - coiling 02/07/2010  . Chronic headache    since aneurysm diagnosis. mostly related to constipation as of 2017  . Edema   . Familial hypercholesteremia    LDL over 300; lipoprotein testing 01/2009  . Family history of heart disease in female family member before age 45    LDL over 300 prior  . H/O cardiovascular stress test 12/08/05   fair exercise capacity, no ST segment changes suggestive of ischemia  . H/O echocardiogram 03/22/01   mild asymmetric LVH, mild aortic sclerosis, mild mitral regurgitation  . History of blood transfusion    x2 due to heavy bleeding and anemia from fibroids  . Hx of CT scan of chest 09/2016   Coronary calcium score of 15. This was 35 percentile for age and sex  . S/P partial hysterectomy    due to uterine fibroids  . 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 Nigel Sloop)  . Vitamin D deficiency   . Wears glasses    Current Outpatient Medications on File Prior to Visit  Medication Sig Dispense Refill  . aspirin EC 81 MG tablet Take 1 tablet (81 mg total) by mouth daily. 90 tablet 3  . CRESTOR 40 MG tablet TAKE 1 TABLET (40 MG TOTAL) BY MOUTH DAILY. 90 tablet 1  . ezetimibe (ZETIA) 10 MG tablet TAKE 1 TABLET BY MOUTH EVERY DAY 90 tablet 1   No current facility-administered medications on file prior to visit.    ROS as in subjective    Objective BP (!) 160/82    Pulse 82   Temp 98 F (36.7 C) (Oral)   Ht 5\' 4"  (1.626 m)   Wt 149 lb 3.2 oz (67.7 kg)   SpO2 99%   BMI 25.61 kg/m   Wt Readings from Last 3 Encounters:  03/24/18 149 lb 3.2 oz (67.7 kg)  12/10/17 149 lb (67.6 kg)  06/26/17 159 lb 6.4 oz (72.3 kg)   BP Readings from Last 3 Encounters:  03/24/18 (!) 160/82  12/10/17 140/90  06/26/17 128/88   General appearance: alert, no distress, WD/WN,  HEENT: normocephalic, sclerae anicteric, +sinus pressure, TMs pearly, nares patent, mild mucoid discharge, +erythema, pharynx with mild erythema and postnasal drainage Oral cavity: MMM, no lesions Neck: supple, no lymphadenopathy, no thyromegaly, no masses Heart: RRR, normal S1, S2, no murmurs Lungs: CTA bilaterally, no wheezes, rhonchi, or rales pulses: 2+ symmetric, upper and lower extremities, normal cap refill      Assessment: Encounter Diagnoses  Name Primary?  Marland Kitchen Respiratory tract infection Yes  . Cough   . Elevated blood-pressure reading without diagnosis of hypertension     Plan: Respiratory tract infection, cough,-discussed symptoms, exam findings, discussed recommendations below Patient Instructions  In general, avoid "decongestants" such as Sudafed, Phenylephrine, or any other sinus decongestants  Avoid over the counter cold medications with "D" in the name.  It would be safe for you to use: Coricidin HBP Mucinex DM Benadryl    Begin Amoxicillin x 10 days Rest Drink plenty of water Use warm fluids, salt water gargles, Chloraseptic for throat pain Consider nasal saline flush to flush out mucous from nose Use some tylenol or ibuprofen for throat pain     Regarding elevated blood pressure, advise she avoid decongestants, monitor blood pressures for the next 2 weeks and get me readings.  Discussed if not running normal in the 120/70 range needs to come in so we can discuss adding a blood pressure medication in light of her history of aneurysm   Debbie [name] was seen  today for sinus problem.  Diagnoses and all orders for this visit:  Respiratory tract infection  Cough  Elevated blood-pressure reading without diagnosis of hypertension  Other orders -     amoxicillin (AMOXIL) 400 MG/5ML suspension; Take 10 mLs (800 mg total) by mouth 2 (two) times daily.

## 2018-04-02 ENCOUNTER — Other Ambulatory Visit: Payer: Self-pay | Admitting: Medical

## 2018-04-02 ENCOUNTER — Telehealth: Payer: Self-pay | Admitting: Medical

## 2018-04-02 MED ORDER — AZITHROMYCIN 250 MG PO TABS: ORAL_TABLET | ORAL | 0 refills | Status: DC

## 2018-04-02 MED ORDER — PROMETHAZINE-DM 6.25-15 MG/5ML PO SYRP: 5.0000 mL | ORAL_SOLUTION | Freq: Four times a day (QID) | ORAL | 0 refills | Status: DC | PRN

## 2018-04-02 NOTE — Telephone Encounter (Signed)
Called pt advised of rx

## 2018-04-02 NOTE — Telephone Encounter (Signed)
   Please call  Pt called she is feeling worse A lot of mucous, cough non stop especially at night, can't sleep  What she coughs up is thick and yellow   Which is better than when she came in  She coughs so much at a time that it can be hard to catch her breath, states that chest feels heavy  Cough meds you gave her previously ( a few years ago)  was too strong, made her feel nausea and felt "loopy"   [facility] [address]

## 2018-04-02 NOTE — Telephone Encounter (Signed)
I sent Zpak antibiotic and different cough syrup

## 2018-05-28 ENCOUNTER — Telehealth: Payer: Self-pay | Admitting: Medical

## 2018-05-28 NOTE — Telephone Encounter (Signed)
Patient notified and would like to get a base line blood work while on crestor and then another one later to see if the lovastatin is working as well as [employer] has been.

## 2018-05-28 NOTE — Telephone Encounter (Signed)
I reviewed the list that was submitted to me.  I would recommend we try either Pravastatin 40mg  or Lovastatin 20mg  daily since she can't take Lipitor and Crestor not covered by insurance.  If no preference, I'll send Lovastatin to pharmacy

## 2018-05-28 NOTE — Telephone Encounter (Signed)
Pt called and stated that her new insurance plan is a high deductible plan. She can no longer afford Crestor. She emailed me a list of medications from her insurance company that have $0 cost. She also wanted me to remind you that she can not take Lipitor or Rosuvastatin. Pt can be reached [phone].  Sending back list in folder.

## 2018-05-28 NOTE — Telephone Encounter (Signed)
Ok, have her come in for fasting lipid panel

## 2018-05-31 ENCOUNTER — Other Ambulatory Visit: Payer: Self-pay | Admitting: Medical

## 2018-05-31 DIAGNOSIS — E782 Mixed hyperlipidemia: Secondary | ICD-10-CM

## 2018-05-31 NOTE — Telephone Encounter (Signed)
Have you put the orders in?

## 2018-05-31 NOTE — Telephone Encounter (Signed)
Lab order is in

## 2018-05-31 NOTE — Telephone Encounter (Signed)
Called and left message for pt

## 2018-06-11 ENCOUNTER — Other Ambulatory Visit: Payer: Self-pay

## 2018-06-11 ENCOUNTER — Encounter: Payer: Self-pay | Admitting: Medical

## 2018-06-11 ENCOUNTER — Ambulatory Visit: Payer: BC Managed Care – PPO | Admitting: Medical

## 2018-06-11 VITALS — Ht 64.5 in | Wt 144.0 lb

## 2018-06-11 DIAGNOSIS — Z9889 Other specified postprocedural states: Secondary | ICD-10-CM | POA: Diagnosis not present

## 2018-06-11 DIAGNOSIS — Z8679 Personal history of other diseases of the circulatory system: Secondary | ICD-10-CM

## 2018-06-11 DIAGNOSIS — R030 Elevated blood-pressure reading, without diagnosis of hypertension: Secondary | ICD-10-CM | POA: Diagnosis not present

## 2018-06-11 DIAGNOSIS — R42 Dizziness and giddiness: Secondary | ICD-10-CM | POA: Diagnosis not present

## 2018-06-11 DIAGNOSIS — E7801 Familial hypercholesterolemia: Secondary | ICD-10-CM | POA: Diagnosis not present

## 2018-06-11 NOTE — Progress Notes (Signed)
Subjective:     Patient ID: Debbie [name], female   DOB: Apr 01, 1970, 48 y.o.   MRN: [id number]  This visit type was conducted due to national recommendations for restrictions regarding the COVID-19 Pandemic (e.g. social distancing) in an effort to limit this patient's exposure and mitigate transmission in our community.  Due to their co-morbid illnesses, this patient is at least at moderate risk for complications without adequate follow up.  This format is felt to be most appropriate for this patient at this time.    Documentation for virtual audio and video telecommunications through Zoom encounter:  The patient was located at home. The provider was located in the office. The patient did consent to this visit and is aware of possible charges through their insurance for this visit.  The other persons participating in this telemedicine service were none. Time spent on call was 20 minutes and in review of previous records >25 minutes total.  This virtual service is not related to other E/M service within previous 7 days.   HPI Chief Complaint  Patient presents with  . BP    evevated BP, light headed, 140/90    Virtual visit today   Since last visit in January for sick visit and elevated, she has been monitoring BPs some.  Home readings have included 145/80, progressively gotten higher.  But has also had some normals.  Yesterday felt a little bad, a little lightheaded, felt pulse throbbing, and checked BP and it was elevated.  BP 155/105 yesterday  Currently feeling just a little pressure in back of head.  Been walking, doing some floor exercises .   Feels a little funny in head today, some lightheaded, but doesn't feel like she is going to pass out.  Has fullness feeling in head.   No pain though.   No chest pain, no dyspnea.   Lower legs sometimes swell, but this is chronic since 20s.  No numbness, no tingling, no fever, no confusion, no slurred speech.   No specific headache, but as  pressure in back of head/neck , occipital area.  No dizziness.   Vision seems a little off, a little fuzzy in general x few weeks ago.   Been on computer a lot more of late given stay home measures with coronaviurs.    compliant with Apsinr , Crestor  and Zetia  daily.  Saw eye doctor in January, no abnormalities.    She has hx/o aneurysm that was treated with coils.     stopped eating meat last summer, doesn't drink alcohol, limits salt.  Tries to eat healthy, exercises, active.  She is a foster parent and recently had to remove one of the teenager boys from house as he was sneaking out.   She was worried he was going to bring coronavirus back into the house and infect everybody.    No other aggravating or relieving factors. No other complaint.    Past Medical History:  Diagnosis Date  . Anemia    history of  . Aneurysm of carotid artery (HCC)    hx/o - coiling 02/07/2010  . Chronic headache    since aneurysm diagnosis. mostly related to constipation as of 2017  . Edema   . Familial hypercholesteremia    LDL over 300; lipoprotein testing 01/2009  . Family history of heart disease in female family member before age 29    LDL over 300 prior  . H/O cardiovascular stress test 12/08/05   fair exercise capacity, no ST  segment changes suggestive of ischemia  . H/O echocardiogram 03/22/01   mild asymmetric LVH, mild aortic sclerosis, mild mitral regurgitation  . History of blood transfusion    x2 due to heavy bleeding and anemia from fibroids  . Hx of CT scan of chest 09/2016   Coronary calcium score of 15. This was 7394 percentile for age and sex  . S/P partial hysterectomy    due to uterine fibroids  . 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 Nigel Sloopevaschwar)  . Vitamin D deficiency   . Wears glasses     Family History  Problem Relation Age of Onset  . Heart failure Sister   . Heart disease Sister [id number]        stents - LAD  . Hypertension Sister   . Aneurysm Sister   . Hypertension Mother   . Obesity Mother   . Cancer Mother        breast  . Heart disease Father [id number]       hx/o CABG w/ redo, stening, died of MI at age 50yo  . Diabetes Father   . Hyperlipidemia Father   . Heart failure Other   . Cancer Other   . Cancer Maternal Grandmother        breast  . Stroke Maternal Grandfather   . Cancer Paternal Grandfather        prostate cancer    Review of Systems As in subjective       Objective:   Physical Exam  Ht 5' 4.5" (1.638 m)   Wt 144 lb (65.3 kg)   BMI 24.34 kg/m    BP Readings from Last 3 Encounters:  03/24/18 (!) 160/82  12/10/17 140/90  06/26/17 128/88    Due to coronavirus pandemic stay at home measures, patient visit was virtual and they were not examined in person.   Gen: nad, answers questions appropriately      Assessment:     Encounter Diagnoses  Name Primary?  . Dizziness Yes  . Elevated blood-pressure reading without diagnosis of hypertension   . History of cerebral aneurysm repair   . Familial hypercholesterolemia        Plan:     We discussed her symptoms and concerns.  We discussed limitations of virtual visit   At this point she has some elevated readings but she is reluctant to start blood pressure medication.  Some of her symptoms could be attributed to stress but she has no other particularly worrisome cardiac symptoms at the moment.  I reviewed her event monitor and cardiac eval visit back in 2018.  She had a cardiac CT at that time as well.  No particular worrisome findings then.  She does have a history of aneurysm repair in her brain.  I reviewed the 2017 MRI brain which was unremarkable.  After discussing options, she will monitor her blood pressure several times over the next 1 to 2 weeks and get me a list of blood pressure readings.  She is already trying to eat healthy and limiting salt.  She will continue to stay active as she  has been doing.  Advised if any worse symptoms in the meantime or changes in symptoms to call right back.  I advised if her blood pressure staying elevated by the time she calls back that we need to consider going ahead and starting blood pressure medication  She is also due for fasting labs and physical  in May which she will schedule.    Follow-up in 1 to 2 weeks on blood pressure readings and symptoms.  Debbie [name] was seen today for bp.  Diagnoses and all orders for this visit:  Dizziness  Elevated blood-pressure reading without diagnosis of hypertension  History of cerebral aneurysm repair  Familial hypercholesterolemia

## 2018-07-22 ENCOUNTER — Telehealth: Payer: Self-pay | Admitting: Medical

## 2018-07-22 ENCOUNTER — Other Ambulatory Visit: Payer: Self-pay | Admitting: Medical

## 2018-07-22 MED ORDER — ROSUVASTATIN CALCIUM 40 MG PO TABS: ORAL_TABLET | ORAL | 0 refills | Status: DC

## 2018-07-22 MED ORDER — EZETIMIBE 10 MG PO TABS: ORAL_TABLET | ORAL | 0 refills | Status: DC

## 2018-07-22 MED ORDER — ASPIRIN EC 81 MG PO TBEC: 81.0000 mg | DELAYED_RELEASE_TABLET | Freq: Every day | ORAL | 3 refills | Status: DC

## 2018-07-22 NOTE — Telephone Encounter (Signed)
Pt called because she has been to the pharmacy multiple times to pick up her new cholesterol medicine but it was never called in.  I researched chart and looks like Shauna left her a message  4/20 to come in for labs but she never got the message.  She will come in tomorrow for labs.  So she has been off medication for 2 months now.

## 2018-07-22 NOTE — Telephone Encounter (Signed)
Done, still needs fasting lipid

## 2018-07-23 ENCOUNTER — Other Ambulatory Visit: Payer: Self-pay

## 2018-07-23 ENCOUNTER — Other Ambulatory Visit: Payer: BC Managed Care – PPO

## 2018-07-23 DIAGNOSIS — E782 Mixed hyperlipidemia: Secondary | ICD-10-CM

## 2018-07-24 LAB — LIPID PANEL
Chol/HDL Ratio: 7.7 ratio — ABNORMAL HIGH (ref 0.0–4.4)
Cholesterol, Total: 361 mg/dL — ABNORMAL HIGH (ref 100–199)
HDL: 47 mg/dL (ref >39)
LDL Calculated: 299 mg/dL — ABNORMAL HIGH (ref 0–99)
Triglycerides: 74 mg/dL (ref 0–149)
VLDL Cholesterol Cal: 15 mg/dL (ref 5–40)

## 2018-07-26 ENCOUNTER — Other Ambulatory Visit: Payer: Self-pay | Admitting: Medical

## 2018-07-26 DIAGNOSIS — Z1231 Encounter for screening mammogram for malignant neoplasm of breast: Secondary | ICD-10-CM

## 2018-07-26 DIAGNOSIS — Z1239 Encounter for other screening for malignant neoplasm of breast: Secondary | ICD-10-CM

## 2018-07-26 MED ORDER — PITAVASTATIN CALCIUM 4 MG PO TABS: 1.0000 | ORAL_TABLET | Freq: Every day | ORAL | 3 refills | Status: DC

## 2018-07-26 MED ORDER — PRAVASTATIN SODIUM 40 MG PO TABS: 40.0000 mg | ORAL_TABLET | Freq: Every evening | ORAL | 0 refills | Status: DC

## 2018-07-26 NOTE — Telephone Encounter (Signed)
Called pharmacy & Livalo was not covered and there was a note for pharmacy  to change to Pravastatin which they did and it went thru for $0 co pay

## 2018-08-04 ENCOUNTER — Ambulatory Visit
Admission: RE | Admit: 2018-08-04 | Discharge: 2018-08-04 | Disposition: A | Discharge status: Home / Self Care | Payer: BC Managed Care – PPO | Source: Ambulatory Visit | Attending: Medical | Admitting: Medical

## 2018-08-04 ENCOUNTER — Other Ambulatory Visit: Payer: Self-pay

## 2018-08-04 DIAGNOSIS — Z1231 Encounter for screening mammogram for malignant neoplasm of breast: Secondary | ICD-10-CM

## 2018-11-05 ENCOUNTER — Other Ambulatory Visit: Payer: Self-pay

## 2018-11-05 ENCOUNTER — Encounter: Payer: Self-pay | Admitting: Medical

## 2018-11-05 ENCOUNTER — Ambulatory Visit: Payer: BC Managed Care – PPO | Admitting: Medical

## 2018-11-05 VITALS — BP 126/80 | HR 90 | Temp 97.8°F | Ht 64.0 in | Wt 151.2 lb

## 2018-11-05 DIAGNOSIS — Z9229 Personal history of other drug therapy: Secondary | ICD-10-CM | POA: Insufficient documentation

## 2018-11-05 DIAGNOSIS — M25551 Pain in right hip: Secondary | ICD-10-CM

## 2018-11-05 DIAGNOSIS — Z7185 Encounter for immunization safety counseling: Secondary | ICD-10-CM

## 2018-11-05 DIAGNOSIS — M79604 Pain in right leg: Secondary | ICD-10-CM | POA: Diagnosis not present

## 2018-11-05 DIAGNOSIS — M79605 Pain in left leg: Secondary | ICD-10-CM | POA: Insufficient documentation

## 2018-11-05 DIAGNOSIS — Z2821 Immunization not carried out because of patient refusal: Secondary | ICD-10-CM

## 2018-11-05 DIAGNOSIS — M6281 Muscle weakness (generalized): Secondary | ICD-10-CM | POA: Insufficient documentation

## 2018-11-05 DIAGNOSIS — Z7189 Other specified counseling: Secondary | ICD-10-CM

## 2018-11-05 DIAGNOSIS — M25552 Pain in left hip: Secondary | ICD-10-CM

## 2018-11-05 DIAGNOSIS — Z1211 Encounter for screening for malignant neoplasm of colon: Secondary | ICD-10-CM

## 2018-11-05 NOTE — Progress Notes (Signed)
Subjective: Chief Complaint  Patient presents with  . Hip Pain    bilateral, left worse x1 week    Here for bilateral hip pain.  Usually 12,0000 steps daily, 2 miles around neighborhood, gets in about 5mi a day with walking.  Lately she has been jogging intervals.  Doing this loop takes 45 minutes walking, but lately 28 minutes adding the jogging intervals.   Recently was getting soreness in thighs.   Then started getting bad bilat hip pain.   Pain has gotten so bad, she cut out the jogging, then had to just rest completely a few days.  Hasn't jogged in the past week.  No other aggravating or relieving factors. No other complaint.   Past Medical History:  Diagnosis Date  . Anemia    history of  . Aneurysm of carotid artery (HCC)    hx/o - coiling 02/07/2010  . Chronic headache    since aneurysm diagnosis. mostly related to constipation as of 2017  . Edema   . Familial hypercholesteremia    LDL over 300; lipoprotein testing 01/2009  . Family history of heart disease in female family member before age 555    LDL over 300 prior  . H/O cardiovascular stress test 12/08/05   fair exercise capacity, no ST segment changes suggestive of ischemia  . H/O echocardiogram 03/22/01   mild asymmetric LVH, mild aortic sclerosis, mild mitral regurgitation  . History of blood transfusion    x2 due to heavy bleeding and anemia from fibroids  . Hx of CT scan of chest 09/2016   Coronary calcium score of 15. This was 2594 percentile for age and sex  . S/P partial hysterectomy    due to uterine fibroids  . 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 Nigel Sloopevaschwar)  . Vitamin D deficiency   . Wears glasses    Current Outpatient Medications on File Prior to Visit  Medication Sig Dispense Refill  . aspirin EC 81 MG tablet Take 1 tablet (81 mg total) by mouth daily. 90 tablet 3  . ezetimibe (ZETIA) 10 MG tablet TAKE 1 TABLET BY MOUTH EVERY DAY  90 tablet 0  . pravastatin (PRAVACHOL) 40 MG tablet Take 1 tablet (40 mg total) by mouth every evening. 90 tablet 0   No current facility-administered medications on file prior to visit.    Past Surgical History:  Procedure Laterality Date  . ANEURYSM COILING  02/07/10   stent assisted coiling of right internal carotid artery aneurysm in periophthalmic region  . CESAREAN SECTION    . COLONOSCOPY  2011   Dr. Loreta AveMann, anemia workup  . ESOPHAGOGASTRODUODENOSCOPY  2011   Dr. Loreta AveMann  . HERNIA REPAIR     epigastric hernia repair age 48, right inguinal hernia as a child  . PARTIAL HYSTERECTOMY     abdominal due to fibroids, still has ovaries, Dr. Layne BentonBrevard  . TUBAL LIGATION    . tubal reversal  2008     ROS as in subjective   Objective: BP 126/80   Pulse 90   Temp 97.8 F (36.6 C) (Oral)   Ht 5\' 4"  (1.626 m)   Wt 151 lb 3.2 oz (68.6 kg)   SpO2 98%   BMI 25.95 kg/m   Gen: wd, wn, nad Back and legs nontender, hip range of motion normal without pain, mild tenderness over left greater trochanter bursa but otherwise no tenderness over right greater trochanter , no tenderness  over anterior hips or lateral hips, no buttock tenderness, no swelling of the legs, no deformity.  She does note some pain when standing and notes some pain with left hip flexion and resisted flexion. Legs neurovascularly intact, normal DTRs normal strength and sensation     Assessment: Encounter Diagnoses  Name Primary?  . Bilateral hip pain Yes  . Acute muscle weakness   . Leg pain, bilateral   . History of statin therapy   . Influenza vaccination declined   . Vaccine counseling   . Screen for colon cancer      Plan: We discussed her concerns and symptoms.  Her level of pain seems somewhat significant compared to expected milder pain than just muscle soreness from recent activity.  She has been walking for exercise 2 to 5 miles per day for the last several months but just recently 2 weeks ago started  increasing her exercise intensity with jogging intervals.  I still would not expect this level of pain and symptoms with that change in activity.  I wonder if her statin may be causing some myopathy.  I do think it is reasonable to expect some muscle inflammation given the recent activity.  We discussed the following recommendations.  She will call back in 1 to 2 weeks to give me an update.  I also advise she go ahead and schedule for physical fasting and discussed that she will need a colonoscopy, we also discussed updating tetanus and shingles vaccine.  She declines flu shot.  Patient Instructions  Recommendations:  Begin Aleve antiinflammatory over the encounter twice daily for 5 days, then once daily for another 1- 2 weeks  Use ice water pack or bag of frozen peas 20 minutes on, 20 minutes off at least 2 times daily to the anterior hip for the next 3 days  REST for 5 days  For the next 2 weeks, stop Zetia and Pravachol temporarily to see if these medications are having an impact on these symptoms  After 5 days, resume walking initially for a week.   During that time, lets monitor to see if the pains return  After that initial week, if not pain at all, then resume or begin back doing some of the jogging all the while taking Naprosyn those initial 2 weeks, as well as holding off on the cholesterol medication during that 2 week period  If the pains do not improve, or if the pains restart when you resume jogging, then we may want to repeat lumbar spine and hip xrays      Cylah was seen today for hip pain.  Diagnoses and all orders for this visit:  Bilateral hip pain  Acute muscle weakness  Leg pain, bilateral  History of statin therapy  Influenza vaccination declined  Vaccine counseling  Screen for colon cancer

## 2018-11-05 NOTE — Patient Instructions (Addendum)
Recommendations:  Begin Aleve antiinflammatory over the encounter twice daily for 5 days, then once daily for another 1- 2 weeks  Use ice water pack or bag of frozen peas 20 minutes on, 20 minutes off at least 2 times daily to the anterior hip for the next 3 days  REST for 5 days  For the next 2 weeks, stop Zetia and Pravachol temporarily to see if these medications are having an impact on these symptoms  After 5 days, resume walking initially for a week.   During that time, lets monitor to see if the pains return  After that initial week, if not pain at all, then resume or begin back doing some of the jogging all the while taking Naprosyn those initial 2 weeks, as well as holding off on the cholesterol medication during that 2 week period  If the pains do not improve, or if the pains restart when you resume jogging, then we may want to repeat lumbar spine and hip xrays

## 2018-12-24 ENCOUNTER — Other Ambulatory Visit: Payer: Self-pay

## 2018-12-24 ENCOUNTER — Ambulatory Visit: Payer: BC Managed Care – PPO | Admitting: Family Medicine

## 2018-12-24 ENCOUNTER — Encounter: Payer: Self-pay | Admitting: Family Medicine

## 2018-12-24 VITALS — BP 140/92 | HR 69 | Temp 96.6°F | Wt 149.4 lb

## 2018-12-24 DIAGNOSIS — R399 Unspecified symptoms and signs involving the genitourinary system: Secondary | ICD-10-CM

## 2018-12-24 DIAGNOSIS — M549 Dorsalgia, unspecified: Secondary | ICD-10-CM

## 2018-12-24 DIAGNOSIS — Z1211 Encounter for screening for malignant neoplasm of colon: Secondary | ICD-10-CM

## 2018-12-24 LAB — POCT URINALYSIS DIP (PROADVANTAGE DEVICE)
Bilirubin, UA: NEGATIVE
Blood, UA: NEGATIVE
Glucose, UA: NEGATIVE mg/dL
Ketones, POC UA: NEGATIVE mg/dL
Leukocytes, UA: NEGATIVE
Nitrite, UA: NEGATIVE
Protein Ur, POC: NEGATIVE mg/dL
Specific Gravity, Urine: 10.15
Urobilinogen, Ur: 0.2
pH, UA: 6 (ref 5.0–8.0)

## 2018-12-24 NOTE — Patient Instructions (Signed)
Heat for 20 minutes 3 times per day.  2 Aleve twice per day for the next week.  If it hurts do not do it

## 2018-12-24 NOTE — Progress Notes (Signed)
   Subjective:    Patient ID: Debbie [name], female    DOB: 07/15/1970, 48 y.o.   MRN: [id number]  HPI She complains of a 1 week history of intermittent left flank discomfort that is worse later on in the evening.  It is made worse with motion but no history of injury or overuse.  No burning, urgency, frequency, fever or chills.   Review of Systems     Objective:   Physical Exam Alert and in no distress.  Tender to palpation in the left upper lumbar paravertebral muscles.  No tenderness over the spine.  Ribs are normal. Urinalysis is negative      Assessment & Plan:  Musculoskeletal back pain  UTI symptoms - Plan: POCT Urinalysis DIP (Proadvantage Device)  Screening for colon cancer - Plan: Cologuard I explained that her pain is mainly musculoskeletal and has nothing to do with her kidneys or urine.  Recommend heat for 20 minutes 3 times per day as well as 2 Aleve twice per day.  Avoid any activity that makes her pain worse. I will also order Cologuard since she is now 16. Tried to talk her into a flu shot however she was not interested.

## 2019-02-01 ENCOUNTER — Other Ambulatory Visit: Payer: Self-pay

## 2019-02-01 ENCOUNTER — Encounter: Payer: Self-pay | Admitting: Family Medicine

## 2019-02-01 ENCOUNTER — Ambulatory Visit: Payer: BC Managed Care – PPO | Admitting: Family Medicine

## 2019-02-01 VITALS — Temp 97.7°F | Wt 145.0 lb

## 2019-02-01 DIAGNOSIS — Z03818 Encounter for observation for suspected exposure to other biological agents ruled out: Secondary | ICD-10-CM | POA: Diagnosis not present

## 2019-02-01 DIAGNOSIS — Z20822 Contact with and (suspected) exposure to covid-19: Secondary | ICD-10-CM

## 2019-02-01 DIAGNOSIS — J019 Acute sinusitis, unspecified: Secondary | ICD-10-CM

## 2019-02-01 MED ORDER — AMOXICILLIN-POT CLAVULANATE 600-42.9 MG/5ML PO SUSR: 600.0000 mg | Freq: Two times a day (BID) | ORAL | 0 refills | Status: DC

## 2019-02-01 NOTE — Progress Notes (Signed)
   Subjective:    Patient ID: Debbie [name], female    DOB: Dec 01, 1970, 48 y.o.   MRN: [id number]  HPI Documentation for virtual telephone encounter.  Documentation for virtual audio and video telecommunications through [place] encounter:. The patient was located at home. The provider was located in the office. The patient did consent to this visit and is aware of possible charges through their insurance for this visit. The other persons participating in this telemedicine service were none. This virtual service is not related to other E/M service within previous 7 days. For the last 3 weeks she has had difficulty with nasal congestion and purulent drainage but only occurring in the morning.  No associated sore throat, PND, earache, sinus pressure or fever.  Within the last couple of days she has noted increased difficulty with nasal congestion as well as sneezing but again no fever earache or sore throat.  Several people in her family have had Covid however she has had Covid testing last Wednesday and then again a rapid screen yesterday all of which was negative.  Review of Systems     Objective:   Physical Exam Alert and in no distress otherwise not examined      Assessment & Plan:  Acute sinusitis, recurrence not specified, unspecified location - Plan: amoxicillin-clavulanate (AUGMENTIN ES-600) 600-42.9 MG/5ML suspension  COVID-19 ruled out I think it is reasonable to treat her as if she has a sinus infection.  I will place her on liquid as she does not tolerate swallowing pills. She is to call if continued to difficulty.

## 2019-03-22 ENCOUNTER — Telehealth: Payer: Self-pay

## 2019-03-22 NOTE — Telephone Encounter (Signed)
Called pt to advise of the need for her to complete her cologuard. LVM for her to call back if she needed more info. KH

## 2019-04-23 ENCOUNTER — Ambulatory Visit: Payer: BC Managed Care – PPO | Attending: Internal Medicine

## 2019-04-23 DIAGNOSIS — Z23 Encounter for immunization: Secondary | ICD-10-CM | POA: Insufficient documentation

## 2019-04-23 NOTE — Progress Notes (Signed)
   Covid-[facility] Vaccination [facility]  Name:  Debbie [name]    MRN: [id number] DOB: April 15, 1970  04/23/2019  Ms. Flight was observed post Covid-19 immunization.  At 1834 Pt started to feel hot and reports that her heart is racing.   BP 218/105 Pulse 130 RR 20 O2 sat 100% Provided fan and water.  1840 BP 204/92 HR 112 25 mg Benadryl po given  1844 BP 193/98 HR 107  1849 BP 188/95 HR 97  1850 Pt states that she feels better "but still a little off". Pt denies a racing heart rate or feeling hot.  1900 BP 181/84  Pulse 92  O2 Sat 100%  EMT called over to evaluate the patient.  1912   BP 186/91  Pulse 99 O2 Sat 100%  Pt up ambulating w/ EMT  1927  BP 175/106 Pulse 99 O2 Sat 100%  EMT is calling to have EMS take patient to the ED for further evaluation.   She was provided with Vaccine Information Sheet and instruction to access the V-Safe system.   [name] was instructed to call 911 with any severe reactions post vaccine: Marland Kitchen Difficulty breathing  . Swelling of your face and throat  . A fast heartbeat  . A bad rash all over your body  . Dizziness and weakness    Immunizations Administered    Name Date Dose VIS Date Route   Pfizer COVID-19 Vaccine 04/23/2019  6:26 PM 0.3 mL 02/04/2019 Intramuscular   Manufacturer: [employer], [employer]   Lot: [id number]   NDC: [address]-9

## 2019-04-26 ENCOUNTER — Ambulatory Visit: Payer: BC Managed Care – PPO | Admitting: Medical

## 2019-04-26 ENCOUNTER — Encounter: Payer: Self-pay | Admitting: Medical

## 2019-04-26 ENCOUNTER — Other Ambulatory Visit: Payer: Self-pay

## 2019-04-26 VITALS — BP 140/92 | Temp 97.7°F | Ht 64.0 in | Wt 146.0 lb

## 2019-04-26 DIAGNOSIS — R030 Elevated blood-pressure reading, without diagnosis of hypertension: Secondary | ICD-10-CM | POA: Insufficient documentation

## 2019-04-26 DIAGNOSIS — T783XXA Angioneurotic edema, initial encounter: Secondary | ICD-10-CM | POA: Diagnosis not present

## 2019-04-26 DIAGNOSIS — T50Z95A Adverse effect of other vaccines and biological substances, initial encounter: Secondary | ICD-10-CM | POA: Diagnosis not present

## 2019-04-26 NOTE — Progress Notes (Signed)
Subjective:     Patient ID: Debbie [name], female   DOB: 11/17/1970, 49 y.o.   MRN: [id number]  This visit type was conducted due to national recommendations for restrictions regarding the COVID-19 Pandemic (e.g. social distancing) in an effort to limit this patient's exposure and mitigate transmission in our community.  Due to their co-morbid illnesses, this patient is at least at moderate risk for complications without adequate follow up.  This format is felt to be most appropriate for this patient at this time.    Documentation for virtual audio and video telecommunications through Zoom encounter:  The patient was located at home. The provider was located in the office. The patient did consent to this visit and is aware of possible charges through their insurance for this visit.  The other persons participating in this telemedicine service were none. Time spent on call was 20 minutes and in review of previous records 20 minutes total.  This virtual service is not related to other E/M service within previous 7 days.   HPI Chief Complaint  Patient presents with  . Allergic Reaction    to covid vaccine-swollen, sore, itchy, increased bp    She notes having the covid vaccine #1 this past Saturday 3 days ago at the [facility] vaccine clinic.   She was already apprehensive about getting the vaccine.  About 10 minutes into the vaccine, started feeling a hot sensation throughout her body.  Heart started beating fast.   Felt a little lightheaded.  She got the attention of the nurses, they came over and checked her out.  They checked vitals and BP 214/155 initially, heart rate was 150bpm roughly initially.   Oxygen sat was normal.  They kept her for about 3 hours for observation.   Lowers her BP got was 190/115.  Heart rate stayed elevated the whole time but came down some to 116 range.   At one point they were considering calling EMS.  After a while thought she felt a little better.  She felt a  little blurry with vision, so didn't want to drive.  She also didn't have her glasses so she attributed the blurred vision to this.  Husband finally came and picked her up.   They ended up giving her 1 dose of benadryl 25mg .  No other treatment.    She denies prior vaccine reaction, but has reacted to contrast media in the past and has several other drug allergies.    She notes 140/95, 140/80 readings today and yesterday.  There were a few times yesterday that BP was 130/75.  Pulse rate has been in the 70s the last few days.    Has tried to drink more water the last few days but could drink more.  Has not taken any more benadryl.  Does have itchy sensation under skin where the injection was given.    Arm is a little sore but not bad.    No dyspnea, no SOB, no chest pain, no hives.   Had mild rash on injection arm last night, but it is mild only located at injection site.   Rash was kind of patchy.  Injection arm was left arm.    Weeks prior to covid vaccine her BPs were usually 130/80 range.    Past Medical History:  Diagnosis Date  . Anemia    history of  . 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 Chronic headache    since aneurysm  diagnosis. mostly related to constipation as of 2017  . Edema   . Familial hypercholesteremia    LDL over 300; lipoprotein testing 01/2009  . Family history of heart disease in female family member before age 57    LDL over 300 prior  . H/O cardiovascular stress test 12/08/05   fair exercise capacity, no ST segment changes suggestive of ischemia  . H/O echocardiogram 03/22/01   mild asymmetric LVH, mild aortic sclerosis, mild mitral regurgitation  . History of blood transfusion    x2 due to heavy bleeding and anemia from fibroids  . Hx of CT scan of chest 09/2016   Coronary calcium score of 15. This was 56 percentile for age and sex  . S/P partial hysterectomy    due to uterine fibroids  . 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 Nigel Sloop)  . Vitamin D deficiency   . Wears glasses    Current Outpatient Medications on File Prior to Visit  Medication Sig Dispense Refill  . aspirin EC 81 MG tablet Take 1 tablet (81 mg total) by mouth daily. (Patient taking differently: Take 325 mg by mouth daily. ) 90 tablet 3  . pravastatin (PRAVACHOL) 40 MG tablet Take 1 tablet (40 mg total) by mouth every evening. 90 tablet 0  . amoxicillin-clavulanate (AUGMENTIN ES-600) 600-42.9 MG/5ML suspension Take 5 mLs (600 mg total) by mouth 2 (two) times daily. (Patient not taking: Reported on 04/26/2019) 200 mL 0  . ezetimibe (ZETIA) 10 MG tablet TAKE 1 TABLET BY MOUTH EVERY DAY (Patient not taking: Reported on 04/26/2019) 90 tablet 0   No current facility-administered medications on file prior to visit.    Review of Systems As in subjective    Objective:   Physical Exam Due to coronavirus pandemic stay at home measures, patient visit was virtual and they were not examined in person.    BP Readings from Last 3 Encounters:  04/26/19 (!) 140/92  12/24/18 (!) 140/92  11/05/18 126/80        Assessment:     Encounter Diagnoses  Name Primary?  . Vaccine reaction, initial encounter Yes  . Transient elevated blood pressure   . Angioedema, initial encounter        Plan:     Discussed symptoms, concerns.   She had mild angioedema symptoms, but elevated pulse, BP and anxiety with the vaccine.   Was monitored for 3 hours in surveillance at vaccine site.  She is improving, home BP and pulse certainly improved.   I will list covid vaccine in her allergies and she will not get the second dose.  Advised she use Benadryl 25mg  QHS the next 2-3 nights, can use 12.5mg  Benadryl in the morning the next few days.   Increase hydration.  Rest.    If any new symptoms, new angioedema, no rash, no skin findings, other new symptoms, call 911 or get re-evaluated  immediately.   Jazsmine was seen today for allergic reaction.  Diagnoses and all orders for this visit:  Vaccine reaction, initial encounter  Transient elevated blood pressure  Angioedema, initial encounter

## 2019-04-27 ENCOUNTER — Encounter: Payer: Self-pay | Admitting: Medical

## 2019-04-27 ENCOUNTER — Other Ambulatory Visit: Payer: Self-pay | Admitting: Medical

## 2019-04-27 ENCOUNTER — Telehealth: Payer: Self-pay | Admitting: Internal Medicine

## 2019-04-27 ENCOUNTER — Ambulatory Visit: Payer: BC Managed Care – PPO | Admitting: Medical

## 2019-04-27 VITALS — BP 136/82 | HR 79 | Temp 97.8°F | Ht 64.0 in | Wt 151.2 lb

## 2019-04-27 DIAGNOSIS — Z8249 Family history of ischemic heart disease and other diseases of the circulatory system: Secondary | ICD-10-CM

## 2019-04-27 DIAGNOSIS — R0789 Other chest pain: Secondary | ICD-10-CM | POA: Diagnosis not present

## 2019-04-27 DIAGNOSIS — R030 Elevated blood-pressure reading, without diagnosis of hypertension: Secondary | ICD-10-CM | POA: Diagnosis not present

## 2019-04-27 DIAGNOSIS — Z9889 Other specified postprocedural states: Secondary | ICD-10-CM

## 2019-04-27 DIAGNOSIS — R5383 Other fatigue: Secondary | ICD-10-CM | POA: Diagnosis not present

## 2019-04-27 DIAGNOSIS — T50Z95A Adverse effect of other vaccines and biological substances, initial encounter: Secondary | ICD-10-CM | POA: Diagnosis not present

## 2019-04-27 DIAGNOSIS — E7801 Familial hypercholesterolemia: Secondary | ICD-10-CM

## 2019-04-27 DIAGNOSIS — Z8679 Personal history of other diseases of the circulatory system: Secondary | ICD-10-CM

## 2019-04-27 DIAGNOSIS — E782 Mixed hyperlipidemia: Secondary | ICD-10-CM

## 2019-04-27 NOTE — Progress Notes (Signed)
Subjective:     Patient ID: Debbie [name], female   DOB: September 25, 1970, 49 y.o.   MRN: [id number]  HPI Chief Complaint  Patient presents with  . Follow-up   This is an in person follow-up to virtual consult than yesterday.  We discussed coming in for labs with a follow-up phone call she placed yesterday.  She is concerned about ongoing fatigue, not feeling right since having the Covid vaccine, has some extreme fatigue, some body aches.  She requested to have labs done.  From her virtual consult yesterday, she notes having the covid vaccine #1 this past Saturday 3 days ago at the [facility] vaccine clinic.   She was already apprehensive about getting the vaccine.  About 10 minutes into the vaccine, started feeling a hot sensation throughout her body.  Heart started beating fast.   Felt a little lightheaded.  She got the attention of the nurses, they came over and checked her out.  They checked vitals and BP 214/155 initially, heart rate was 150bpm roughly initially.   Oxygen sat was normal.  They kept her for about 3 hours for observation.   Lowest her BP got was 190/115.  Heart rate stayed elevated the whole time but came down some to 116 range.   At one point they were considering calling EMS.  After a while thought she felt a little better.  She felt a little blurry with vision, so didn't want to drive.  She also didn't have her glasses so she attributed the blurred vision to this.  Husband finally came and picked her up.   They ended up giving her 1 dose of benadryl 25mg .  No other treatment.    She denies prior vaccine reaction, but has reacted to contrast media in the past and has several other drug allergies.    She notes 140/95, 140/80 readings today and yesterday.  There were a few times yesterday that BP was 130/75.  Pulse rate has been in the 70s the last few days.    Has tried to drink more water the last few days but could drink more.  Has not taken any more benadryl.  Does have itchy  sensation under skin where the injection was given.    Arm is a little sore but not bad.    No dyspnea, no SOB, no chest pain, no hives.   Had mild rash on injection arm last night, but it is mild only located at injection site.   Rash was kind of patchy.  Injection arm was left arm.    Weeks prior to covid vaccine her BPs were usually 130/80 range.    Past Medical History:  Diagnosis Date  . Anemia    history of  . Aneurysm of carotid artery ([facility])    hx/o - coiling 02/07/2010  . Chronic headache    since aneurysm diagnosis. mostly related to constipation as of 2017  . Edema   . Familial hypercholesteremia    LDL over 300; lipoprotein testing 01/2009  . Family history of heart disease in female family member before age 65    LDL over 300 prior  . H/O cardiovascular stress test 12/08/05   fair exercise capacity, no ST segment changes suggestive of ischemia  . H/O echocardiogram 03/22/01   mild asymmetric LVH, mild aortic sclerosis, mild mitral regurgitation  . History of blood transfusion    x2 due to heavy bleeding and anemia from fibroids  . Hx of CT scan of chest 09/2016  Coronary calcium score of 15. This was 58 percentile for age and sex  . S/P partial hysterectomy    due to uterine fibroids  . 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 Nigel Sloop)  . Vitamin D deficiency   . Wears glasses    Current Outpatient Medications on File Prior to Visit  Medication Sig Dispense Refill  . aspirin EC 81 MG tablet Take 1 tablet (81 mg total) by mouth daily. (Patient taking differently: Take 325 mg by mouth daily. ) 90 tablet 3  . pravastatin (PRAVACHOL) 40 MG tablet Take 1 tablet (40 mg total) by mouth every evening. 90 tablet 0  . amoxicillin-clavulanate (AUGMENTIN ES-600) 600-42.9 MG/5ML suspension Take 5 mLs (600 mg total) by mouth 2 (two) times daily. (Patient not taking: Reported on 04/26/2019) 200 mL 0  .  ezetimibe (ZETIA) 10 MG tablet TAKE 1 TABLET BY MOUTH EVERY DAY (Patient not taking: Reported on 04/26/2019) 90 tablet 0   No current facility-administered medications on file prior to visit.    Review of Systems As in subjective    Objective:   Physical Exam Due to coronavirus pandemic stay at home measures, patient visit was virtual and they were not examined in person.    BP 136/82   Pulse 79   Temp 97.8 F (36.6 C)   Ht 5\' 4"  (1.626 m)   Wt 151 lb 3.2 oz (68.6 kg)   SpO2 98%   BMI 25.95 kg/m   BP Readings from Last 3 Encounters:  04/27/19 136/82  04/26/19 (!) 140/92  12/24/18 (!) 140/92     General appearence: alert, no distress, WD/WN, African American female  Oral cavity: MMM, no lesions, no angioedema Neck: supple, no lymphadenopathy, no thyromegaly, no masses, no obvious bruits Heart: RRR, normal S1, S2, no murmurs Lungs: CTA bilaterally, no wheezes, rhonchi, or rales Abdomen: +bs, soft, non tender, non distended, no masses, no hepatomegaly, no splenomegaly No arm tenderness, no rash or bruise of the left arm from injection site, no decreased range of motion, normal arm exam Extremities: no edema, no cyanosis, no clubbing Pulses: 2+ symmetric, upper and lower extremities, normal cap refill Neurological: alert, oriented x 3, CN2-12 intact, strength normal upper extremities and lower extremities, sensation normal throughout, DTRs 2+ throughout, no cerebellar signs, gait normal Psychiatric: normal affect, behavior normal, pleasant       Assessment:     Encounter Diagnoses  Name Primary?  . Fatigue, unspecified type Yes  . Transient elevated blood pressure   . Vaccine reaction, initial encounter   . Chest discomfort   . Mixed dyslipidemia   . History of cerebral aneurysm repair   . Family history of premature CAD   . Family history of aneurysm   . Family history of heart disease in female family member before age 32   . Familial hypercholesterolemia         Plan:     Labs today at her request given the recent fatigue and unusual symptoms.  Discussed symptoms, concerns.   She had mild angioedema symptoms, but elevated pulse, BP and anxiety with the vaccine.   Was monitored for 3 hours in surveillance at vaccine site.  She is improving, home BP and pulse certainly improved.   I will list covid vaccine in her allergies and she will not get the second dose.  Advised she use Benadryl 25mg  QHS the next 2-3 nights, can use 12.5mg  Benadryl in  the morning the next few days.   Increase hydration.  Rest.    If any new symptoms, new angioedema, no rash, no skin findings, other new symptoms, call 911 or get re-evaluated immediately.  Her blood pressures are mostly controlled at home.  She has had transient elevation of blood pressures in the past.  She has been back on Pravachol for the last 3 months.  She prefers Crestor which worked better for her but insurance declined to cover this in the past year  She has history of vertebral artery aneurysm with prior coil treatment.   Her last imaging with angiography or brain and neck was 2012 but she has declined additional surveillance with angiography.    I reviewed her most recent MRI brain from 2017.  I reviewed her cardiac calcium score from 2018 as well as last echocardiogram 2003 which showed mild asymmetric LVD and mild aortic sclerosis   I will consult with cardiology who she saw in 2018, Dr. Ellyn Hack for advice on whether she needs a repeat echocardiogram or not  Leauna was seen today for follow-up.  Diagnoses and all orders for this visit:  Fatigue, unspecified type -     Sedimentation rate -     CBC -     Comprehensive metabolic panel  Transient elevated blood pressure -     Sedimentation rate -     CBC -     Comprehensive metabolic panel -     Cancel: Ambulatory referral to Cardiology  Vaccine reaction, initial encounter -     Sedimentation rate -     CBC -     Comprehensive metabolic  panel  Chest discomfort -     Sedimentation rate -     CBC -     Comprehensive metabolic panel  Mixed dyslipidemia -     Lipid panel -     Cancel: Ambulatory referral to Cardiology  History of cerebral aneurysm repair -     Cancel: Ambulatory referral to Cardiology  Family history of premature CAD -     Cancel: Ambulatory referral to Cardiology  Family history of aneurysm -     Cancel: Ambulatory referral to Cardiology  Family history of heart disease in female family member before age 1 -     Cancel: Ambulatory referral to Cardiology  Familial hypercholesterolemia -     Cancel: Ambulatory referral to Cardiology

## 2019-04-27 NOTE — Telephone Encounter (Signed)
Pt coming in this morning to see shane

## 2019-04-27 NOTE — Telephone Encounter (Signed)
I put in labs , but we may want to put on schedule for me to examine and get vitals too

## 2019-04-27 NOTE — Telephone Encounter (Signed)
Pt states that she wants to come in for blood work to see if anything is going on. Pt states she is very exhausted and last night had a pinching sensation in her chest. She is having some SOB. She said she is exhausted from just taking a shower this morning. Please advise as this is since having vaccine on saturday

## 2019-04-28 ENCOUNTER — Other Ambulatory Visit: Payer: Self-pay | Admitting: Medical

## 2019-04-28 LAB — LIPID PANEL
Chol/HDL Ratio: 7.9 ratio — ABNORMAL HIGH (ref 0.0–4.4)
Cholesterol, Total: 363 mg/dL — ABNORMAL HIGH (ref 100–199)
HDL: 46 mg/dL (ref >39)
LDL Chol Calc (NIH): 304 mg/dL — ABNORMAL HIGH (ref 0–99)
Triglycerides: 85 mg/dL (ref 0–149)
VLDL Cholesterol Cal: 13 mg/dL (ref 5–40)

## 2019-04-28 LAB — COMPREHENSIVE METABOLIC PANEL
ALT: 8 IU/L (ref 0–32)
AST: 13 IU/L (ref 0–40)
Albumin/Globulin Ratio: 1.4 (ref 1.2–2.2)
Albumin: 4.2 g/dL (ref 3.8–4.9)
Alkaline Phosphatase: 78 IU/L (ref 39–117)
BUN/Creatinine Ratio: 12 (ref 9–23)
BUN: 9 mg/dL (ref 6–24)
Bilirubin Total: 0.4 mg/dL (ref 0.0–1.2)
CO2: 23 mmol/L (ref 20–29)
Calcium: 9.7 mg/dL (ref 8.7–10.2)
Chloride: 104 mmol/L (ref 96–106)
Creatinine, Ser: 0.74 mg/dL (ref 0.57–1.00)
GFR calc Af Amer: 108 mL/min/{1.73_m2} (ref >59)
GFR calc non Af Amer: 94 mL/min/{1.73_m2} (ref >59)
Globulin, Total: 3 g/dL (ref 1.5–4.5)
Glucose: 84 mg/dL (ref 65–99)
Potassium: 4.5 mmol/L (ref 3.5–5.2)
Sodium: 141 mmol/L (ref 134–144)
Total Protein: 7.2 g/dL (ref 6.0–8.5)

## 2019-04-28 LAB — CBC
Hematocrit: 40.4 % (ref 34.0–46.6)
Hemoglobin: 13.2 g/dL (ref 11.1–15.9)
MCH: 28.9 pg (ref 26.6–33.0)
MCHC: 32.7 g/dL (ref 31.5–35.7)
MCV: 89 fL (ref 79–97)
Platelets: 306 10*3/uL (ref 150–450)
RBC: 4.56 x10E6/uL (ref 3.77–5.28)
RDW: 13 % (ref 11.7–15.4)
WBC: 6.1 10*3/uL (ref 3.4–10.8)

## 2019-04-28 LAB — SEDIMENTATION RATE: Sed Rate: 46 mm/hr — ABNORMAL HIGH (ref 0–40)

## 2019-04-28 MED ORDER — ASPIRIN EC 81 MG PO TBEC: 325.0000 mg | DELAYED_RELEASE_TABLET | Freq: Every day | ORAL | 3 refills | Status: DC

## 2019-04-28 MED ORDER — ROSUVASTATIN CALCIUM 20 MG PO TABS: 20.0000 mg | ORAL_TABLET | Freq: Every day | ORAL | 3 refills | Status: DC

## 2019-04-29 ENCOUNTER — Other Ambulatory Visit: Payer: Self-pay

## 2019-04-29 MED ORDER — ASPIRIN EC 325 MG PO TBEC: 325.0000 mg | DELAYED_RELEASE_TABLET | Freq: Every day | ORAL | 1 refills | Status: DC

## 2019-05-14 ENCOUNTER — Ambulatory Visit: Payer: Self-pay

## 2019-05-20 ENCOUNTER — Encounter: Payer: Self-pay | Admitting: Family Medicine

## 2019-05-20 ENCOUNTER — Ambulatory Visit: Payer: BC Managed Care – PPO | Admitting: Family Medicine

## 2019-05-20 ENCOUNTER — Other Ambulatory Visit: Payer: Self-pay

## 2019-05-20 ENCOUNTER — Ambulatory Visit (HOSPITAL_COMMUNITY)
Admission: RE | Admit: 2019-05-20 | Discharge: 2019-05-20 | Disposition: A | Discharge status: Home / Self Care | Payer: BC Managed Care – PPO | Source: Ambulatory Visit | Attending: Family Medicine | Admitting: Family Medicine

## 2019-05-20 VITALS — BP 160/90 | HR 75 | Temp 97.7°F | Resp 18 | Wt 153.8 lb

## 2019-05-20 DIAGNOSIS — R1013 Epigastric pain: Secondary | ICD-10-CM

## 2019-05-20 DIAGNOSIS — M546 Pain in thoracic spine: Secondary | ICD-10-CM

## 2019-05-20 DIAGNOSIS — E7801 Familial hypercholesterolemia: Secondary | ICD-10-CM

## 2019-05-20 DIAGNOSIS — R10816 Epigastric abdominal tenderness: Secondary | ICD-10-CM

## 2019-05-20 DIAGNOSIS — E78019 Familial hypercholesterolemia, unspecified: Secondary | ICD-10-CM

## 2019-05-20 DIAGNOSIS — Z8679 Personal history of other diseases of the circulatory system: Secondary | ICD-10-CM

## 2019-05-20 DIAGNOSIS — Z8249 Family history of ischemic heart disease and other diseases of the circulatory system: Secondary | ICD-10-CM

## 2019-05-20 DIAGNOSIS — I720 Aneurysm of carotid artery: Secondary | ICD-10-CM | POA: Insufficient documentation

## 2019-05-20 LAB — CBC WITH DIFFERENTIAL/PLATELET
Basophils Absolute: 0.1 10*3/uL (ref 0.0–0.2)
Basos: 1 %
EOS (ABSOLUTE): 0.1 10*3/uL (ref 0.0–0.4)
Eos: 2 %
Hematocrit: 39.4 % (ref 34.0–46.6)
Hemoglobin: 13 g/dL (ref 11.1–15.9)
Lymphocytes Absolute: 2.9 10*3/uL (ref 0.7–3.1)
Lymphs: 39 %
MCH: 29 pg (ref 26.6–33.0)
MCHC: 33 g/dL (ref 31.5–35.7)
MCV: 88 fL (ref 79–97)
Monocytes Absolute: 0.5 10*3/uL (ref 0.1–0.9)
Monocytes: 7 %
Neutrophils Absolute: 3.9 10*3/uL (ref 1.4–7.0)
Neutrophils: 51 %
Platelets: 372 10*3/uL (ref 150–450)
RBC: 4.49 x10E6/uL (ref 3.77–5.28)
RDW: 13.9 % (ref 11.7–15.4)
WBC: 7.5 10*3/uL (ref 3.4–10.8)

## 2019-05-20 LAB — AMYLASE: Amylase: 59 U/L (ref 31–110)

## 2019-05-20 LAB — COMPREHENSIVE METABOLIC PANEL
ALT: 11 IU/L (ref 0–32)
AST: 13 IU/L (ref 0–40)
Albumin/Globulin Ratio: 1.2 (ref 1.2–2.2)
Albumin: 4 g/dL (ref 3.8–4.9)
Alkaline Phosphatase: 79 IU/L (ref 39–117)
BUN/Creatinine Ratio: 9 (ref 9–23)
BUN: 7 mg/dL (ref 6–24)
Bilirubin Total: 0.4 mg/dL (ref 0.0–1.2)
CO2: 30 mmol/L — ABNORMAL HIGH (ref 20–29)
Calcium: 9.6 mg/dL (ref 8.7–10.2)
Chloride: 104 mmol/L (ref 96–106)
Creatinine, Ser: 0.81 mg/dL (ref 0.57–1.00)
GFR calc Af Amer: 97 mL/min/{1.73_m2} (ref >59)
GFR calc non Af Amer: 84 mL/min/{1.73_m2} (ref >59)
Globulin, Total: 3.3 g/dL (ref 1.5–4.5)
Glucose: 97 mg/dL (ref 65–99)
Potassium: 4.2 mmol/L (ref 3.5–5.2)
Sodium: 140 mmol/L (ref 134–144)
Total Protein: 7.3 g/dL (ref 6.0–8.5)

## 2019-05-20 LAB — LIPASE: Lipase: 25 U/L (ref 14–72)

## 2019-05-20 NOTE — Progress Notes (Signed)
Subjective:  Documentation for virtual audio and video telecommunications through [place] encounter: The visit was turned into an in office visit after talking to her briefly.   The patient was located at home. 2 patient identifiers used.  The provider was located in the office. The patient did consent to this visit and is aware of possible charges through their insurance for this visit.  The other persons participating in this telemedicine service were none.    Patient ID: Debbie [name], female    DOB: [birth year], 49 y.o.   MRN: [id number]  HPI Chief Complaint  Patient presents with  . reaction    feb 27th had 1st shot and had reaction- mouth swelling, high bp. feeling better but last couple days had some achy and pain in core area.    She complains of a 2 day history of constant aching and fullness in her epigastric region and rib cage.  She is having intermittent sharp epigastric pain which radiates to her back .  Feels like she is unable to take a full breath.  Denies injury.  Last bowel movement was yesterday evening and it was normal appearing, no blood.   Denies fever, chills, body aches, dizziness, chest pain, palpitations, cough, nausea, vomiting, diarrhea or constipation.   Denies history of GERD.  Does not drink alcohol.   She takes aspirin 325 mg daily due to history of carotid aneurysm. She had a coil inserted.   Review of Systems Pertinent positives and negatives in the history of present illness.     Objective:   Physical Exam Constitutional:      General: She is not in acute distress.    Appearance: She is not ill-appearing.  Eyes:     Conjunctiva/sclera: Conjunctivae normal.  Cardiovascular:     Rate and Rhythm: Normal rate and regular rhythm.     Pulses: Normal pulses.     Heart sounds: Normal heart sounds.  Pulmonary:     Effort: Pulmonary effort is normal.     Breath sounds: Normal breath sounds.  Chest:     Chest wall: No tenderness.   Abdominal:     General: Abdomen is flat. Bowel sounds are normal. There is no distension or abdominal bruit.     Palpations: Abdomen is soft. There is no hepatomegaly, mass or pulsatile mass.     Tenderness: There is abdominal tenderness in the epigastric area. There is no right CVA tenderness, left CVA tenderness, guarding or rebound. Negative signs include Murphy's sign, Rovsing's sign and McBurney's sign.  Musculoskeletal:     Cervical back: Normal range of motion and neck supple.  Lymphadenopathy:     Cervical: No cervical adenopathy.  Skin:    General: Skin is warm and dry.     Capillary Refill: Capillary refill takes less than 2 seconds.     Coloration: Skin is not pale.     Findings: No rash.  Neurological:     General: No focal deficit present.     Mental Status: She is alert and oriented to person, place, and time.     Cranial Nerves: Cranial nerves are intact.     Sensory: Sensation is intact.     Coordination: Coordination is intact.     Gait: Gait is intact.    BP (!) 160/90   Pulse 75   Temp 97.7 F (36.5 C)   Resp 18   Wt 153 lb 12.8 oz (69.8 kg)   BMI 26.40 kg/m  Assessment & Plan:  Epigastric pain - Plan: CBC with Differential/Platelet, Comprehensive metabolic panel, Lipase, Amylase, EKG 12-Lead, US Abdomen Complete, DG Chest 2 View -she is visibly uncomfortable. Discussed possible etiologies including pancreas, gallbladder, GERD. Will check stat labs, chest XR and abdominal US to rule out any life threatening etiology.  EKG is unremarkable. Consider starting PPI if nothing shows up  Epigastric abdominal tenderness without rebound tenderness - Plan: CBC with Differential/Platelet, Comprehensive metabolic panel, Lipase, Amylase, EKG 12-Lead, DG Chest 2 View -see above  Acute midline thoracic back pain - Plan: CBC with Differential/Platelet, Comprehensive metabolic panel, Lipase, Amylase, EKG 12-Lead, DG Chest 2 View  History of aneurysm  Family  history of aneurysm  Familial hypercholesterolemia

## 2019-05-25 ENCOUNTER — Telehealth: Payer: Self-pay

## 2019-05-25 NOTE — Telephone Encounter (Signed)
I submitted a PA for the pts. Crestor brand name and it was covered from 05/25/19-05/24/20. So the pt. Should be able to pick up the prescription for Crestor and have it filled as the brand name Crestor.

## 2019-05-30 NOTE — Telephone Encounter (Signed)
Ok, I assume she is aware correct?

## 2019-06-06 NOTE — Telephone Encounter (Signed)
I called the pt. And LM stating that her Crestor was approved and she can pick that up at the pharmacy.

## 2019-06-14 ENCOUNTER — Encounter: Payer: Self-pay | Admitting: Medical

## 2019-06-23 ENCOUNTER — Ambulatory Visit: Payer: BC Managed Care – PPO | Admitting: Medical

## 2019-06-23 ENCOUNTER — Encounter: Payer: Self-pay | Admitting: Medical

## 2019-06-23 ENCOUNTER — Telehealth: Payer: Self-pay | Admitting: Medical

## 2019-06-23 ENCOUNTER — Other Ambulatory Visit: Payer: Self-pay

## 2019-06-23 VITALS — BP 162/90 | HR 76 | Temp 98.3°F | Ht 64.0 in | Wt 151.4 lb

## 2019-06-23 DIAGNOSIS — R519 Headache, unspecified: Secondary | ICD-10-CM | POA: Insufficient documentation

## 2019-06-23 DIAGNOSIS — H1132 Conjunctival hemorrhage, left eye: Secondary | ICD-10-CM

## 2019-06-23 DIAGNOSIS — H538 Other visual disturbances: Secondary | ICD-10-CM | POA: Insufficient documentation

## 2019-06-23 DIAGNOSIS — R030 Elevated blood-pressure reading, without diagnosis of hypertension: Secondary | ICD-10-CM

## 2019-06-23 DIAGNOSIS — Z9889 Other specified postprocedural states: Secondary | ICD-10-CM

## 2019-06-23 DIAGNOSIS — E782 Mixed hyperlipidemia: Secondary | ICD-10-CM

## 2019-06-23 DIAGNOSIS — Z8249 Family history of ischemic heart disease and other diseases of the circulatory system: Secondary | ICD-10-CM

## 2019-06-23 DIAGNOSIS — Z8679 Personal history of other diseases of the circulatory system: Secondary | ICD-10-CM

## 2019-06-23 DIAGNOSIS — E7801 Familial hypercholesterolemia: Secondary | ICD-10-CM

## 2019-06-23 NOTE — Progress Notes (Signed)
Subjective:  Debbie [name] is a 49 y.o. female who presents for Chief Complaint  Patient presents with  . Eye Problem     Here for bursted blood vessel left eye she noticed occurred yesterday.  She had a tickle in throat and was coughing few nights ago.  However, head has felt unusual, head pressure overall, left vision is blurred close up.    She checks home BPs some, but not in last 2 weeks.  Most of the time BPs normal at home unless not feeling well.   She plans to restart Crestor but pharmacy had it on backorder.  She will pick this up today.  She is using her zetia.   otherwise in normal state of health.  No chest pain, no syncope, no dizziness, no fall, no SOB  No other aggravating or relieving factors.    No other c/o.  The following portions of the patient's history were reviewed and updated as appropriate: allergies, current medications, past family history, past medical history, past social history, past surgical history and problem list.  ROS Otherwise as in subjective above  Objective: BP (!) 162/90   Pulse 76   Temp 98.3 F (36.8 C)   Ht 5\' 4"  (1.626 m)   Wt 151 lb 6.4 oz (68.7 kg)   SpO2 99%   BMI 25.99 kg/m   Wt Readings from Last 3 Encounters:  06/23/19 151 lb 6.4 oz (68.7 kg)  05/20/19 153 lb 12.8 oz (69.8 kg)  04/27/19 151 lb 3.2 oz (68.6 kg)   BP Readings from Last 3 Encounters:  06/23/19 (!) 162/90  05/20/19 (!) 160/90  04/27/19 136/82    General appearance: alert, no distress, well developed, well nourished, African-American female Left lateral I am with bloodshot appearance of the subconjunctival, otherwise HEENT normocephalic, unremarkable, sclerae anicteric, conjunctiva otherwise pink and moist, pharynx normal Oral cavity: MMM, no lesions Neck: supple, no lymphadenopathy, no thyromegaly, no masses, no bruits Heart: RRR, normal S1, S2, no murmurs Lungs: CTA bilaterally, no wheezes, rhonchi, or rales Pulses: 2+ radial pulses, 2+ pedal pulses,  normal cap refill Ext: no edema   Assessment: Encounter Diagnoses  Name Primary?  . Subconjunctival hemorrhage of left eye Yes  . Blurred vision   . Nonintractable headache, unspecified chronicity pattern, unspecified headache type   . History of cerebral aneurysm repair   . History of aneurysm   . Family history of aneurysm   . Mixed dyslipidemia   . Family history of heart disease in female family member before age 17   . Familial hypercholesterolemia   . Elevated blood-pressure reading without diagnosis of hypertension      Plan: Discussed symport, concerns  In general we discussed that some conjunctival hemorrhage by itself may not be a major concern however she is having elevated blood pressures, unusual head symptoms, history of aneurysm.  I advise she go ahead and see her eye doctor, Dr. 53 within the next 48 hours for work in appointment  I recommend we start blood pressure medicine but she declines.  She feels like home readings are normal.  She will monitor and get me readings within 2 weeks.  We discussed that she needs to be around 120/70 or less.  Also reiterated the need to get back on a statin right away her familial history   Debbie [name] was seen today for eye problem.  Diagnoses and all orders for this visit:  Subconjunctival hemorrhage of left eye  Blurred vision  Nonintractable headache, unspecified  chronicity pattern, unspecified headache type  History of cerebral aneurysm repair  History of aneurysm  Family history of aneurysm  Mixed dyslipidemia  Family history of heart disease in female family member before age 63  Familial hypercholesterolemia  Elevated blood-pressure reading without diagnosis of hypertension    Follow up: pending call backs

## 2019-06-23 NOTE — Telephone Encounter (Signed)
Send copy of today's office note to her eye doctor, Dr. Caryn Section

## 2019-06-24 NOTE — Telephone Encounter (Signed)
Done

## 2019-07-21 ENCOUNTER — Other Ambulatory Visit: Payer: Self-pay | Admitting: Medical

## 2019-07-21 ENCOUNTER — Telehealth: Payer: Self-pay | Admitting: Medical

## 2019-07-21 MED ORDER — AMLODIPINE BESYLATE 5 MG PO TABS: 5.0000 mg | ORAL_TABLET | Freq: Every day | ORAL | 2 refills | Status: DC

## 2019-07-21 NOTE — Telephone Encounter (Signed)
Begin amlodipine 1 tab daily in the morning.  Follow-up in 3 weeks

## 2019-07-21 NOTE — Telephone Encounter (Signed)
Pt called and states that she is still having BP problems her bo is running 150/100 to 165/110 and that has been this week and some reading have been 140/80, she states you suggested some bp medicine and she is ok with taking amlodipine and olmesartan, and is ok with starting with the lowest dose, she does not want to take the RX metoprolol pt uses [id number] - [place], [place] - [address] and pt can be reached at [phone]

## 2019-07-22 NOTE — Telephone Encounter (Signed)
Patient informed and will call back to schedule appointment

## 2019-08-04 ENCOUNTER — Telehealth: Payer: Self-pay | Admitting: Medical

## 2019-08-04 NOTE — Telephone Encounter (Signed)
It looks like she and Vincenza Hews have been discussing starting her on BP medication for a few weeks and he sent in the prescription several days ago for the 5 mg amlodipine. I recommend having him address this with her next week when he returns. She could get the 5 mg and take 1/2 tablet until he returns if she would like.

## 2019-08-04 NOTE — Telephone Encounter (Signed)
Patient has been informed of provider message. Patient will call back next week to schedule with [facility]

## 2019-08-04 NOTE — Telephone Encounter (Signed)
With her prior known health issues and recent blood pressure issues I really think she needs to follow my recommendations and take the medication.  5 mg with the typical starting dose.  Rarely do we have someone on 2.5 mg.  So I consider 5 mg the lower dose in general.  So please try and encourage her to begin the medicine we already discussed

## 2019-08-04 NOTE — Telephone Encounter (Signed)
Pt called and is requesting that she be on the 2.5 dose of amlodipine, states she does not want to take the 5mg , states she informed shane that she wanted to start out on the lowest dose, and not start out on the 5mg  so she did not pick up the 5mg . Please advise pt can be reached at [phone] informed pt that shane was out of the office she wanted me to send to shane and another provider

## 2019-08-08 NOTE — Telephone Encounter (Signed)
Patient stated she will be bad patient for now and take the low dose. She is going to try taking the medication at night and see if it makes a difference. She will only take 1/2 tablet at night starting tonight and will try to increase in a couple days if she feels ok taking it.

## 2019-08-08 NOTE — Telephone Encounter (Signed)
Patient stated she took 2.5mg  today and yesterday and it made her feel loopy. Stated she had to do a presentation this morning and felt loopy. She wants to know how long will she feel like that? Please advise.

## 2019-08-08 NOTE — Telephone Encounter (Signed)
If she just started the medication, lets give it a few more days this week before we give up on this.   Hydrate well throughout the day, limit salt.  If she continues to feel bad for several days this week, then I can change to a different blood pressure medication.  Don't obsess though and check blood pressures multiple times per day.

## 2019-08-09 NOTE — Telephone Encounter (Signed)
Not ideal, but try this and give me a call back or do follow up by Friday

## 2019-08-14 ENCOUNTER — Encounter (HOSPITAL_BASED_OUTPATIENT_CLINIC_OR_DEPARTMENT_OTHER): Payer: Self-pay | Admitting: Emergency Medicine

## 2019-08-14 ENCOUNTER — Emergency Department (HOSPITAL_BASED_OUTPATIENT_CLINIC_OR_DEPARTMENT_OTHER)
Admission: EM | Admit: 2019-08-14 | Discharge: 2019-08-15 | Disposition: A | Discharge status: Home / Self Care | Payer: BC Managed Care – PPO | Attending: Emergency Medicine | Admitting: Emergency Medicine

## 2019-08-14 ENCOUNTER — Emergency Department (HOSPITAL_BASED_OUTPATIENT_CLINIC_OR_DEPARTMENT_OTHER): Payer: BC Managed Care – PPO

## 2019-08-14 ENCOUNTER — Other Ambulatory Visit: Payer: Self-pay

## 2019-08-14 DIAGNOSIS — R079 Chest pain, unspecified: Secondary | ICD-10-CM | POA: Diagnosis present

## 2019-08-14 DIAGNOSIS — Z79899 Other long term (current) drug therapy: Secondary | ICD-10-CM | POA: Diagnosis not present

## 2019-08-14 DIAGNOSIS — R000 Tachycardia, unspecified: Secondary | ICD-10-CM | POA: Diagnosis not present

## 2019-08-14 DIAGNOSIS — R0789 Other chest pain: Secondary | ICD-10-CM

## 2019-08-14 DIAGNOSIS — R110 Nausea: Secondary | ICD-10-CM | POA: Diagnosis not present

## 2019-08-14 DIAGNOSIS — Z7982 Long term (current) use of aspirin: Secondary | ICD-10-CM | POA: Diagnosis not present

## 2019-08-14 DIAGNOSIS — R42 Dizziness and giddiness: Secondary | ICD-10-CM | POA: Insufficient documentation

## 2019-08-14 LAB — CBC WITH DIFFERENTIAL/PLATELET
Abs Immature Granulocytes: 0.03 10*3/uL (ref 0.00–0.07)
Basophils Absolute: 0.1 10*3/uL (ref 0.0–0.1)
Basophils Relative: 1 %
Eosinophils Absolute: 0.1 10*3/uL (ref 0.0–0.5)
Eosinophils Relative: 1 %
HCT: 44 % (ref 36.0–46.0)
Hemoglobin: 13.6 g/dL (ref 12.0–15.0)
Immature Granulocytes: 0 %
Lymphocytes Relative: 27 %
Lymphs Abs: 2.5 10*3/uL (ref 0.7–4.0)
MCH: 28.7 pg (ref 26.0–34.0)
MCHC: 30.9 g/dL (ref 30.0–36.0)
MCV: 92.8 fL (ref 80.0–100.0)
Monocytes Absolute: 0.7 10*3/uL (ref 0.1–1.0)
Monocytes Relative: 8 %
Neutro Abs: 5.9 10*3/uL (ref 1.7–7.7)
Neutrophils Relative %: 63 %
Platelets: 336 10*3/uL (ref 150–400)
RBC: 4.74 MIL/uL (ref 3.87–5.11)
RDW: 12.2 % (ref 11.5–15.5)
WBC: 9.3 10*3/uL (ref 4.0–10.5)
nRBC: 0 % (ref 0.0–0.2)

## 2019-08-14 LAB — TROPONIN I (HIGH SENSITIVITY): Troponin I (High Sensitivity): 7 ng/L (ref <18)

## 2019-08-14 LAB — BASIC METABOLIC PANEL
Anion gap: 11 (ref 5–15)
BUN: 11 mg/dL (ref 6–20)
CO2: 24 mmol/L (ref 22–32)
Calcium: 9.1 mg/dL (ref 8.9–10.3)
Chloride: 106 mmol/L (ref 98–111)
Creatinine, Ser: 0.65 mg/dL (ref 0.44–1.00)
GFR calc Af Amer: >60 mL/min (ref >60)
GFR calc non Af Amer: >60 mL/min (ref >60)
Glucose, Bld: 103 mg/dL — ABNORMAL HIGH (ref 70–99)
Potassium: 3.4 mmol/L — ABNORMAL LOW (ref 3.5–5.1)
Sodium: 141 mmol/L (ref 135–145)

## 2019-08-14 LAB — D-DIMER, QUANTITATIVE: D-Dimer, Quant: 1.01 ug/mL-FEU — ABNORMAL HIGH (ref 0.00–0.50)

## 2019-08-14 MED ORDER — SODIUM CHLORIDE 0.9 % IV BOLUS
1000.0000 mL | Freq: Once | INTRAVENOUS | Status: AC
  Administered 2019-08-14: 1000 mL via INTRAVENOUS

## 2019-08-14 MED ORDER — DIPHENHYDRAMINE HCL 25 MG PO CAPS
50.0000 mg | ORAL_CAPSULE | Freq: Once | ORAL | Status: AC
  Administered 2019-08-15: 50 mg via ORAL
  Filled 2019-08-14: qty 2

## 2019-08-14 MED ORDER — DIPHENHYDRAMINE HCL 50 MG/ML IJ SOLN: 50.0000 mg | Freq: Once | INTRAMUSCULAR | Status: AC

## 2019-08-14 MED ORDER — HYDROCORTISONE NA SUCCINATE PF 250 MG IJ SOLR
200.0000 mg | Freq: Once | INTRAMUSCULAR | Status: DC
  Filled 2019-08-14: qty 200

## 2019-08-14 NOTE — ED Provider Notes (Signed)
[facility] EMERGENCY DEPARTMENT Provider Note   CSN: [id number] Arrival date & time: 08/14/19  2148     History Chief Complaint  Patient presents with  . Chest Pain    Debbie [name] is a 49 y.o. female.  HPI 49 year old female presents with lightheadedness.  Started a little before 9 PM.  She was at someone else's house when she started to feel lightheaded and nauseated.  She later started to feel chest pain that felt like a fullness in her chest.  Feels that way in her back as well.  Does not really feel short of breath.  She rates the pain in her chest is about a 3 or 4 out of 10.  She denies any headaches.  She felt like she would faint.  No leg swelling or focal weakness.  No abdominal pain. She has not had as much water to drink over the last 2 days.   Past Medical History:  Diagnosis Date  . Anemia    history of  . Aneurysm of carotid artery (HCC)    hx/o - coiling 02/07/2010  . Chronic headache    since aneurysm diagnosis. mostly related to constipation as of 2017  . Edema   . Familial hypercholesteremia    LDL over 300; lipoprotein testing 01/2009  . Family history of heart disease in female family member before age 2    LDL over 300 prior  . H/O cardiovascular stress test 12/08/05   fair exercise capacity, no ST segment changes suggestive of ischemia  . H/O echocardiogram 03/22/01   mild asymmetric LVH, mild aortic sclerosis, mild mitral regurgitation  . History of blood transfusion    x2 due to heavy bleeding and anemia from fibroids  . Hx of CT scan of chest 09/2016   Coronary calcium score of 15. This was 34 percentile for age and sex  . S/P partial hysterectomy    due to uterine fibroids  . 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 Nigel Sloop)  . Vitamin D deficiency   . Wears glasses     Patient Active Problem List   Diagnosis Date Noted  . Subconjunctival hemorrhage of  left eye 06/23/2019  . Blurred vision 06/23/2019  . Nonintractable headache 06/23/2019  . Aneurysm of carotid artery (HCC)   . Fatigue 04/27/2019  . Chest discomfort 04/27/2019  . Vaccine reaction, initial encounter 04/26/2019  . Elevated blood-pressure reading without diagnosis of hypertension 04/26/2019  . Angio-edema 04/26/2019  . Bilateral hip pain 11/05/2018  . Leg pain, bilateral 11/05/2018  . History of statin therapy 11/05/2018  . Depressed mood 12/10/2017  . Marital conflict 12/10/2017  . Family history of premature CAD 07/18/2016  . Impaired fasting blood sugar 11/16/2015  . Family history of diabetes mellitus 11/16/2015  . Screen for colon cancer 04/17/2015  . History of aneurysm 04/17/2015  . History of cerebral aneurysm repair 04/17/2015  . Family history of aneurysm 04/17/2015  . Family history of heart disease in female family member before age 56 04/17/2015  . Mixed dyslipidemia 04/17/2015  . Familial hypercholesterolemia 04/17/2015  . Vitamin D deficiency 04/17/2015  . Family history of breast cancer 04/17/2015  . Constipation 04/17/2015    Past Surgical History:  Procedure Laterality Date  . ANEURYSM COILING  02/07/10   stent assisted coiling of right internal carotid artery aneurysm in periophthalmic region  . CESAREAN SECTION    . COLONOSCOPY  2011  Dr. Collene Mares, anemia workup  . ESOPHAGOGASTRODUODENOSCOPY  2011   Dr. Collene Mares  . HERNIA REPAIR     epigastric hernia repair age 73, right inguinal hernia as a child  . PARTIAL HYSTERECTOMY     abdominal due to fibroids, still has ovaries, Dr. Butler Denmark  . TUBAL LIGATION    . tubal reversal  2008     OB History   No obstetric history on file.     Family History  Problem Relation Age of Onset  . Heart failure Sister   . Heart disease Sister 59       stents - LAD  . Hypertension Sister   . Aneurysm Sister   . Hypertension Mother   . Obesity Mother   . Cancer Mother        breast  . Heart disease Father  59       hx/o CABG w/ redo, stening, died of MI at age 24yo  . Diabetes Father   . Hyperlipidemia Father   . Heart failure Other   . Cancer Other   . Cancer Maternal Grandmother        breast  . Stroke Maternal Grandfather   . Cancer Paternal Grandfather        prostate cancer    Social History   Tobacco Use  . Smoking status: Never Smoker  . Smokeless tobacco: Never Used  Substance Use Topics  . Alcohol use: No  . Drug use: No    Home Medications Prior to Admission medications   Medication Sig Start Date End Date Taking? Authorizing Provider  amLODipine (NORVASC) 5 MG tablet Take 1 tablet (5 mg total) by mouth daily. 07/21/19 07/20/20  Tysinger, Camelia Eng, PA-C  aspirin EC 325 MG tablet Take 1 tablet (325 mg total) by mouth daily. 04/29/19   Tysinger, Camelia Eng, PA-C  ezetimibe (ZETIA) 10 MG tablet TAKE 1 TABLET BY MOUTH EVERY DAY 07/22/18   Tysinger, Camelia Eng, PA-C  rosuvastatin (CRESTOR) 20 MG tablet Take 1 tablet (20 mg total) by mouth at bedtime. 04/28/19 04/27/20  Tysinger, Camelia Eng, PA-C    Allergies    Covid-19 (adenovirus) vaccine, Contrast media [iodinated diagnostic agents], Flexeril [cyclobenzaprine], Lipitor [atorvastatin calcium], Paxil [paroxetine hcl], and Sulfa antibiotics  Review of Systems   Review of Systems  Respiratory: Negative for shortness of breath.   Cardiovascular: Positive for chest pain.  Gastrointestinal: Positive for nausea. Negative for abdominal pain, diarrhea and vomiting.  Neurological: Positive for light-headedness. Negative for weakness and headaches.  All other systems reviewed and are negative.   Physical Exam Updated Vital Signs BP (!) 179/113 (BP Location: Right Arm)   Pulse (!) 104   Temp 99.1 F (37.3 C) (Oral)   Resp 18   Ht 5' 4.5" (1.638 m)   Wt 68.5 kg   SpO2 100%   BMI 25.52 kg/m   Physical Exam Vitals and nursing note reviewed.  Constitutional:      General: She is not in acute distress.    Appearance: She is  well-developed. She is not ill-appearing or diaphoretic.  HENT:     Head: Normocephalic and atraumatic.     Right Ear: External ear normal.     Left Ear: External ear normal.     Nose: Nose normal.  Eyes:     General:        Right eye: No discharge.        Left eye: No discharge.     Extraocular Movements: Extraocular movements intact.  Pupils: Pupils are equal, round, and reactive to light.  Cardiovascular:     Rate and Rhythm: Regular rhythm. Tachycardia present.     Heart sounds: Normal heart sounds. No murmur heard.   Pulmonary:     Effort: Pulmonary effort is normal.     Breath sounds: Normal breath sounds.  Abdominal:     Palpations: Abdomen is soft.     Tenderness: There is no abdominal tenderness.  Musculoskeletal:     Right lower leg: No tenderness. No edema.     Left lower leg: No tenderness. No edema.  Skin:    General: Skin is warm and dry.  Neurological:     Mental Status: She is alert.     Comments: CN 3-12 grossly intact. 5/5 strength in all 4 extremities. Grossly normal sensation. Normal finger to nose.   Psychiatric:        Mood and Affect: Mood is not anxious.     ED Results / Procedures / Treatments   Labs (all labs ordered are listed, but only abnormal results are displayed) Labs Reviewed  BASIC METABOLIC PANEL - Abnormal; Notable for the following components:      Result Value   Potassium 3.4 (*)    Glucose, Bld 103 (*)    All other components within normal limits  D-DIMER, QUANTITATIVE (NOT AT [facility]) - Abnormal; Notable for the following components:   D-Dimer, Quant 1.01 (*)    All other components within normal limits  CBC WITH DIFFERENTIAL/PLATELET  TROPONIN I (HIGH SENSITIVITY)  TROPONIN I (HIGH SENSITIVITY)    EKG EKG Interpretation  Date/Time:  Sunday August 14 2019 22:01:45 EDT Ventricular Rate:  107 PR Interval:    QRS Duration: 86 QT Interval:  319 QTC Calculation: 426 R Axis:   48 Text Interpretation: Sinus tachycardia no  acute ST/T changes rate is faster compared to 2015 Confirmed by Pricilla Loveless [phone]) on 08/14/2019 10:12:38 PM   Radiology DG Chest 2 View  Result Date: 08/14/2019 CLINICAL DATA:  Chest pain.  Lightheaded. EXAM: CHEST - 2 VIEW COMPARISON:  05/20/2019. FINDINGS: The cardiomediastinal contours are normal. The lungs are clear. Pulmonary vasculature is normal. No consolidation, pleural effusion, or pneumothorax. No acute osseous abnormalities are seen. IMPRESSION: No acute chest findings. Electronically Signed   By: Narda Rutherford M.D.   On: 08/14/2019 23:26    Procedures Procedures (including critical care time)  Medications Ordered in ED Medications  hydrocortisone sodium succinate (SOLU-CORTEF) injection 200 mg (has no administration in time range)  diphenhydrAMINE (BENADRYL) capsule 50 mg (has no administration in time range)    Or  diphenhydrAMINE (BENADRYL) injection 50 mg (has no administration in time range)  hydrocortisone sodium succinate (SOLU-CORTEF) 100 MG injection (has no administration in time range)  sodium chloride 0.9 % bolus 1,000 mL (1,000 mLs Intravenous New Bag/Given 08/14/19 2252)    ED Course  I have reviewed the triage vital signs and the nursing notes.  Pertinent labs & imaging results that were available during my care of the patient were reviewed by me and considered in my medical decision making (see chart for details).    MDM Rules/Calculators/A&P                          Patient's ECG is unremarkable besides some tachycardia. Initial labs include negative troponin. However Ddimer is positive. Discussed with patient, will get CTA. She had hives to contrast in past, no other symptoms to suggest anaphylaxis. She  agrees to contrast allergy protocol and CTA. Will get 2nd troponin. Ultimately I think ACS and PE are less likely. Care transferred to Dr. Wilkie Aye. Final Clinical Impression(s) / ED Diagnoses Final diagnoses:  None    Rx / DC Orders ED  Discharge Orders    None       Pricilla Loveless, MD 08/15/19 [phone]

## 2019-08-14 NOTE — ED Triage Notes (Signed)
Per [facility] "anxiety symptoms" that started 2 hours PTA. (chest fullness, intermittent nausea, BIL arm pain "in the bones") HTN (being followed for this), unremarkable EKG and CBG per EMS.

## 2019-08-15 ENCOUNTER — Emergency Department (HOSPITAL_BASED_OUTPATIENT_CLINIC_OR_DEPARTMENT_OTHER): Payer: BC Managed Care – PPO

## 2019-08-15 ENCOUNTER — Encounter (HOSPITAL_BASED_OUTPATIENT_CLINIC_OR_DEPARTMENT_OTHER): Payer: Self-pay

## 2019-08-15 LAB — TROPONIN I (HIGH SENSITIVITY): Troponin I (High Sensitivity): 9 ng/L (ref <18)

## 2019-08-15 MED ORDER — IOHEXOL 350 MG/ML SOLN
100.0000 mL | Freq: Once | INTRAVENOUS | Status: AC | PRN
  Administered 2019-08-15: 100 mL via INTRAVENOUS

## 2019-08-15 MED ORDER — HYDROCORTISONE NA SUCCINATE PF 100 MG IJ SOLR
INTRAMUSCULAR | Status: AC
  Administered 2019-08-15: 200 mg
  Filled 2019-08-15: qty 4

## 2019-08-15 NOTE — ED Notes (Signed)
To CT

## 2019-08-15 NOTE — ED Notes (Signed)
Pt had no reactions from CT contrast. Pt aware of signs to look for if a reaction would occur.

## 2019-08-15 NOTE — Discharge Instructions (Addendum)
You were seen today for chest pain.  Your work-up was reassuring including cardiac testing and CT scan for blood clots.  Follow-up with your primary doctor.  It may be time to have a repeat stress test.

## 2019-08-15 NOTE — ED Notes (Signed)
Report received 

## 2019-08-15 NOTE — ED Notes (Signed)
Return from CT

## 2019-08-15 NOTE — ED Provider Notes (Signed)
Patient signed out pending CTA and repeat troponin.  She requires premedication for CT.  5:46 AM Repeat troponin 9.  Delta of 2.  This is reassuring.  Additionally, CTA without evidence of pulmonary embolism.  Discussed results with patient who is reassured.  She did not have any contrast reaction.  She does report previously having a stress test.  Recommend she follow-up with her primary doctor for evaluation for possible repeat testing.  Patient is agreeable to plan.  After history, exam, and medical workup I feel the patient has been appropriately medically screened and is safe for discharge home. Pertinent diagnoses were discussed with the patient. Patient was given return precautions.    Shon Baton, MD 08/15/19 [phone]

## 2019-08-15 NOTE — ED Notes (Signed)
MD with pt  

## 2019-08-22 ENCOUNTER — Other Ambulatory Visit: Payer: Self-pay

## 2019-08-22 ENCOUNTER — Encounter: Payer: Self-pay | Admitting: Medical

## 2019-08-22 ENCOUNTER — Ambulatory Visit: Payer: BC Managed Care – PPO | Admitting: Medical

## 2019-08-22 VITALS — BP 162/90 | HR 89 | Ht 64.0 in | Wt 152.4 lb

## 2019-08-22 DIAGNOSIS — Z9889 Other specified postprocedural states: Secondary | ICD-10-CM | POA: Diagnosis not present

## 2019-08-22 DIAGNOSIS — E782 Mixed hyperlipidemia: Secondary | ICD-10-CM

## 2019-08-22 DIAGNOSIS — Z8249 Family history of ischemic heart disease and other diseases of the circulatory system: Secondary | ICD-10-CM

## 2019-08-22 DIAGNOSIS — I10 Essential (primary) hypertension: Secondary | ICD-10-CM | POA: Insufficient documentation

## 2019-08-22 DIAGNOSIS — Z8639 Personal history of other endocrine, nutritional and metabolic disease: Secondary | ICD-10-CM | POA: Insufficient documentation

## 2019-08-22 DIAGNOSIS — T50905A Adverse effect of unspecified drugs, medicaments and biological substances, initial encounter: Secondary | ICD-10-CM | POA: Insufficient documentation

## 2019-08-22 DIAGNOSIS — T50Z95D Adverse effect of other vaccines and biological substances, subsequent encounter: Secondary | ICD-10-CM

## 2019-08-22 DIAGNOSIS — Z8679 Personal history of other diseases of the circulatory system: Secondary | ICD-10-CM

## 2019-08-22 DIAGNOSIS — T50Z95A Adverse effect of other vaccines and biological substances, initial encounter: Secondary | ICD-10-CM | POA: Insufficient documentation

## 2019-08-22 NOTE — Progress Notes (Signed)
Subjective:  Debbie [name] is a 49 y.o. female who presents for Chief Complaint  Patient presents with  . Hypertension     Here for BP, letter for work about covid vaccine.  She has history of fluctuating BPs but more consistently elevated BPs in recent months.   Refused to take beta blockers due to prior reactions of anorgasmia and spasm in jaw.   Currently only tolerating Amlodipine 2.5mg  due to feeling fatigue, drowsy, worried about side effects.  She notes she started this in the day but was extremely fatigued.  She changed this to night time dosing.    She notes hx/o palpitations with prior low potassium in remote past.   She denies recent palpations . Not currently on potassium supplements.    She has hx/o cerebral aneurysm repair in remote past and has been reluctant to follow up with interventional radiology in recent past several years for fear of complication on angiogram/evaluation.      She wants letter for work to work remotely.  She feels she is greater risk of covid infection.  Wants to be able to work remotely to reduce risk of catching covid.   She got first covid vaccine dose but had reaction.  She is not willing to get 2nd covid vaccine dose.  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  Work requires letter of necessity.  She is also concerned about prior d-dimer positive  No other aggravating or relieving factors.    No other c/o.  The following portions of the patient's history were reviewed and updated as appropriate: allergies, current medications, past family history, past medical history, past social history, past surgical history and problem list.  ROS Otherwise as in subjective above  Past Medical History:  Diagnosis Date  . Anemia    history of  . Aneurysm of carotid artery (HCC)    hx/o - coiling 02/07/2010  . Chronic headache    since aneurysm diagnosis. mostly related to constipation as of 2017  . Edema   . Familial  hypercholesteremia    LDL over 300; lipoprotein testing 01/2009  . Family history of heart disease in female family member before age 75    LDL over 300 prior  . H/O cardiovascular stress test 12/08/05   fair exercise capacity, no ST segment changes suggestive of ischemia  . H/O echocardiogram 03/22/01   mild asymmetric LVH, mild aortic sclerosis, mild mitral regurgitation  . History of blood transfusion    x2 due to heavy bleeding and anemia from fibroids  . Hx of CT scan of chest 09/2016   Coronary calcium score of 15. This was 7 percentile for age and sex  . S/P partial hysterectomy    due to uterine fibroids  . 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 Nigel Sloop)  . Vitamin D deficiency   . Wears glasses    Current Outpatient Medications on File Prior to Visit  Medication Sig Dispense Refill  . amLODipine (NORVASC) 5 MG tablet Take 1 tablet (5 mg total) by mouth daily. 30 tablet 2  . aspirin EC 325 MG tablet Take 1 tablet (325 mg total) by mouth daily. 90 tablet 1  . ezetimibe (ZETIA) 10 MG tablet TAKE 1 TABLET BY MOUTH EVERY DAY 90 tablet 0  . rosuvastatin (CRESTOR) 20 MG tablet Take 1 tablet (20 mg total) by mouth at bedtime. 90 tablet 3  No current facility-administered medications on file prior to visit.     Objective: BP (!) 162/90   Pulse 89   Ht 5\' 4"  (1.626 m)   Wt 152 lb 6.4 oz (69.1 kg)   SpO2 99%   BMI 26.16 kg/m    Wt Readings from Last 3 Encounters:  08/22/19 152 lb 6.4 oz (69.1 kg)  08/14/19 151 lb (68.5 kg)  06/23/19 151 lb 6.4 oz (68.7 kg)   BP Readings from Last 3 Encounters:  08/22/19 (!) 162/90  08/15/19 (!) 141/87  06/23/19 (!) 162/90   General appearance: alert, no distress, well developed, well nourished Neck: supple, no lymphadenopathy, no thyromegaly, no masses Heart: RRR, normal S1, S2, no murmurs Lungs: CTA bilaterally, no wheezes, rhonchi, or rales Pulses: 2+  radial pulses, 2+ pedal pulses, normal cap refill Ext: no edema    Assessment: Encounter Diagnoses  Name Primary?  . Essential hypertension, benign Yes  . Adverse effect of drug, initial encounter   . History of cerebral aneurysm repair   . Family history of premature CAD   . History of aneurysm   . Family history of aneurysm   . Mixed dyslipidemia   . Adverse effect of vaccine, subsequent encounter   . History of low potassium      Plan: Of note in the past she has been reluctant to take medications.  She has been reluctant to start lipid-lowering medicines in the past going on or off of them at times.  Similarly we have had the same sort of issue with blood pressure.  In recent months her blood pressures are now staying elevated consistently.  She had a prior bad experience with a beta-blocker refuses to use a beta-blocker.  When she initially started amlodipine recently she felt like the 5 mg dose was too strong causing a variety of symptoms including significant fatigue.  She is currently taking 2.5 mg amlodipine at night instead of the recommended morning dosing.  She also has other related cardiac risk factors such as premature CAD in the family, history of cerebral aneurysm repair.  For years we have discussed going back and having follow-up with interventional radiology for updated eval on status of cerebral vascular flow and she has been reluctant to have repeat angiogram due to risk.  At this point I am going to refer to cardiology for help with blood pressure control, medication tolerance, risk factors and evaluation for heart disease  Regarding her request for letter to work remotely, I wrote a letter today.  She had an adverse reaction to 1st covid dose.  She is higher risk for complication from Covid infection.   Wrote letter on her behalf to remain working remotely for now.  Hx/o low potassium -slightly low reading recently in June.  Cannot completely rule out aldosterone  deficiency or other causes.  Currently her potassium is not low enough to warrant supplements.  I will defer to cardiology for evaluation along with her blood pressure evaluation in general  Mixed dyslipidemia-continue statin and lipid-lowering medication.  Has ranged from compliant to reluctant to take medicaiton in the past, off and on.  Hx/o elevated d-dimer.  I reviewed recent emergency dept notes and eval.  We discussed potential causes of elevated d-dimer.    Debbie [name] was seen today for hypertension.  Diagnoses and all orders for this visit:  Essential hypertension, benign -     Ambulatory referral to Cardiology  Adverse effect of drug, initial encounter -  Ambulatory referral to Cardiology  History of cerebral aneurysm repair -     Ambulatory referral to Cardiology  Family history of premature CAD -     Ambulatory referral to Cardiology  History of aneurysm -     Ambulatory referral to Cardiology  Family history of aneurysm -     Ambulatory referral to Cardiology  Mixed dyslipidemia -     Ambulatory referral to Cardiology  Adverse effect of vaccine, subsequent encounter -     Ambulatory referral to Cardiology  History of low potassium    Follow up: pending cardiology consult

## 2019-09-04 ENCOUNTER — Other Ambulatory Visit: Payer: Self-pay | Admitting: Medical

## 2019-10-03 ENCOUNTER — Other Ambulatory Visit: Payer: Self-pay

## 2019-10-03 ENCOUNTER — Encounter: Payer: Self-pay | Admitting: Cardiology

## 2019-10-03 ENCOUNTER — Ambulatory Visit: Payer: BC Managed Care – PPO | Admitting: Cardiology

## 2019-10-03 VITALS — BP 150/81 | HR 76 | Resp 16 | Ht 64.0 in | Wt 157.0 lb

## 2019-10-03 DIAGNOSIS — I10 Essential (primary) hypertension: Secondary | ICD-10-CM

## 2019-10-03 DIAGNOSIS — I2510 Atherosclerotic heart disease of native coronary artery without angina pectoris: Secondary | ICD-10-CM

## 2019-10-03 DIAGNOSIS — Z8249 Family history of ischemic heart disease and other diseases of the circulatory system: Secondary | ICD-10-CM

## 2019-10-03 DIAGNOSIS — E782 Mixed hyperlipidemia: Secondary | ICD-10-CM

## 2019-10-03 NOTE — Progress Notes (Signed)
Date:  10/03/2019   ID:  Keane Police, DOB 05/05/1970, MRN [id number]  PCP:  Jac Canavan, PA-C  Cardiologist:  Tessa Lerner, DO, [facility] (established care 10/03/2019) Former Cardiology Providers: Dr. Bryan Lemma.   REASON FOR CONSULT: Family history of premature coronary disease and hypertension management  REQUESTING PHYSICIAN:  Jac Canavan, PA-C [address] [place],  [place] [address]  Chief Complaint  Patient presents with  . New Patient (Initial Visit)  . Hypertension  . Premature CAD    HPI  Debbie [name] is a 49 y.o. female who presents to the office with a chief complaint of " family history of heart disease and hypertension." She is referred to the office at the request of Jac Canavan, PA-C. 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   Patient is referred to the office for management of hypertension.  Patient states that after receiving her first Pfizer Covid vaccine she is noted her blood pressures to be elevated and was diagnosed with high blood pressure.  She is in the office for further management of high blood pressure at the request of her PCP.  Patient was prescribed amlodipine 5 mg p.o. daily but currently only takes 2.5 p.o. every afternoon.  Patient states that she is reluctant to take antihypertensive medications due to the side effects.  She states that her home blood pressure readings are (am 120-130 / 75-79 and pm 130-140 / 77-57mmHg). I do not have a blood pressure log for review these ranges this is per patient.  She denies episodes of headaches, chest pain, shortness of breath, no pain between the shoulder blades.  Patient does have family history of premature coronary artery disease.  Patient's father had a myocardial  infarction at the age of 20 and underwent bypass surgery.  Patient underwent coronary artery calcification score back in 2018 and her total score was 15 placing her at the 94th percentile for age and sex matched cohorts.  Patient is currently on aspirin and statin therapy.  Her last ischemic evaluation was more than a decade ago.  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 She use to see Dr. Corliss Skains but has been lost to follow up.   Denies prior history of myocardial infarction, congestive heart failure, deep venous thrombosis, pulmonary embolism, stroke, transient ischemic attack.  FUNCTIONAL STATUS: No structured exercise program or daily routine.   ALLERGIES: Allergies  Allergen Reactions  . Covid-19 (Adenovirus) Vaccine Other (See Comments)    High BP, pulses elevated, mild tongue swelling, mild dyspnea  . Beta Adrenergic Blockers     Spasm, inorgasmic, not willing to do beta blocker  . Contrast Media [Iodinated Diagnostic Agents]     For CT Angio, breaks out in hives  . Flexeril [Cyclobenzaprine]   . Lipitor [Atorvastatin Calcium] Other (See Comments)    Pt states she had a bad reaction several years ago when taking this  . Paxil [Paroxetine Hcl]     Tongue swelling had trouble swallowing   . Sulfa Antibiotics Other (See Comments)    Unknown reaction    MEDICATION LIST PRIOR TO VISIT: Current Meds  Medication Sig  . amLODipine (NORVASC) 2.5 MG tablet Take 2.5 mg by mouth daily.  Marland Kitchen aspirin EC 325 MG tablet Take  1 tablet (325 mg total) by mouth daily.  Marland Kitchen ezetimibe (ZETIA) 10 MG tablet TAKE 1 TABLET BY MOUTH EVERY DAY  . rosuvastatin (CRESTOR) 20 MG tablet Take 1 tablet (20 mg total) by mouth at bedtime.     PAST MEDICAL HISTORY: Past Medical History:  Diagnosis Date  . Anemia    history of  . Aneurysm of carotid artery (HCC)    hx/o - coiling 02/07/2010  . Chronic headache    since aneurysm  diagnosis. mostly related to constipation as of 2017  . Coronary artery calcification   . Edema   . Familial hypercholesteremia    LDL over 300; lipoprotein testing 01/2009  . Family history of heart disease in female family member before age 50    LDL over 300 prior  . H/O cardiovascular stress test 12/08/05   fair exercise capacity, no ST segment changes suggestive of ischemia  . H/O echocardiogram 03/22/01   mild asymmetric LVH, mild aortic sclerosis, mild mitral regurgitation  . History of blood transfusion    x2 due to heavy bleeding and anemia from fibroids  . Hx of CT scan of chest 09/2016   Coronary calcium score of 15. This was 37 percentile for age and sex  . Hypertension   . Prediabetes   . S/P partial hysterectomy    due to uterine fibroids  . 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 Nigel Sloop)  . Vitamin D deficiency   . Wears glasses     PAST SURGICAL HISTORY: Past Surgical History:  Procedure Laterality Date  . ANEURYSM COILING  02/07/10   stent assisted coiling of right internal carotid artery aneurysm in periophthalmic region  . CESAREAN SECTION    . COLONOSCOPY  2011   Dr. Loreta Ave, anemia workup  . ESOPHAGOGASTRODUODENOSCOPY  2011   Dr. Loreta Ave  . HERNIA REPAIR     epigastric hernia repair age 84, right inguinal hernia as a child  . PARTIAL HYSTERECTOMY     abdominal due to fibroids, still has ovaries, Dr. Layne Benton  . TUBAL LIGATION    . tubal reversal  2008    FAMILY HISTORY: The patient family history includes Aneurysm in her sister; Cancer in her maternal grandmother, mother, paternal grandfather, and another family member; Diabetes in her father; Heart disease (age of onset: 67) in her sister; Heart disease (age of onset: 20) in her father; Heart failure in her sister and another family member; Hyperlipidemia in her father; Hypertension in her mother and sister; Obesity in her mother;  Stroke in her maternal grandfather.  SOCIAL HISTORY:  The patient  reports that she has never smoked. She has never used smokeless tobacco. She reports that she does not drink alcohol and does not use drugs.  REVIEW OF SYSTEMS: Review of Systems  Constitutional: Negative for chills and fever.  HENT: Negative for hoarse voice and nosebleeds.   Eyes: Negative for discharge, double vision and pain.  Cardiovascular: Negative for chest pain, claudication, dyspnea on exertion, leg swelling, near-syncope, orthopnea, palpitations, paroxysmal nocturnal dyspnea and syncope.  Respiratory: Negative for hemoptysis and shortness of breath.   Musculoskeletal: Negative for muscle cramps and myalgias.  Gastrointestinal: Negative for abdominal pain, constipation, diarrhea, hematemesis, hematochezia, melena, nausea and vomiting.  Neurological: Negative for dizziness.   PHYSICAL EXAM: Vitals with BMI 10/03/2019 08/22/2019 08/15/2019  Height 5\' 4"  5\' 4"  -  Weight 157 lbs 152 lbs 6 oz -  BMI 26.94 26.15 -  Systolic 150 162 [id number]  Diastolic 81 90 87  Pulse 76 89 -   CONSTITUTIONAL: Well-developed and well-nourished. No acute distress.  SKIN: Skin is warm and dry. No rash noted. No cyanosis. No pallor. No jaundice HEAD: Normocephalic and atraumatic.  EYES: No scleral icterus MOUTH/THROAT: Moist oral membranes.  NECK: No JVD present. No thyromegaly noted. No carotid bruits  LYMPHATIC: No visible cervical adenopathy.  CHEST Normal respiratory effort. No intercostal retractions  LUNGS: Clear to auscultation bilaterally. No stridor. No wheezes. No rales.  CARDIOVASCULAR: Regular rate and rhythm, positive S1-S2, no murmurs rubs or gallops appreciated. ABDOMINAL: No apparent ascites.  EXTREMITIES: No peripheral edema  HEMATOLOGIC: No significant bruising NEUROLOGIC: Oriented to person, place, and time. Nonfocal. Normal muscle tone.  PSYCHIATRIC: Normal mood and affect. Normal behavior.  Cooperative  RADIOLOGY: IR angiogram carotid cerebral bilateral: February 12, 2010 approximately 3 mm x 2.2 mm wide neck right internal carotid artery periophthalmic region aneurysm.   CARDIAC DATABASE: EKG: 10/03/2019: NSR, 75bpm, PRWP, low voltage, no underlying injury pattern.  Echocardiogram: Last study done in 2003.   Stress Testing: NA  CT Cardiac Scoring: 10/03/2016: Coronary calcium score of 15. This was 3494 percentile for age and sex matched control.  Heart Catheterization: None  LABORATORY DATA: CBC Latest Ref Rng & Units 08/14/2019 05/20/2019 04/27/2019  WBC 4.0 - 10.5 K/uL 9.3 7.5 6.1  Hemoglobin 12.0 - 15.0 g/dL 11.913.6 14.713.0 82.913.2  Hematocrit 36 - 46 % 44.0 39.4 40.4  Platelets 150 - 400 K/uL 336 372 306    CMP Latest Ref Rng & Units 08/14/2019 05/20/2019 04/27/2019  Glucose 70 - 99 mg/dL 562(Z103(H) 97 84  BUN 6 - 20 mg/dL 11 7 9   Creatinine 0.44 - 1.00 mg/dL 3.080.65 6.570.81 8.460.74  Sodium 135 - 145 mmol/L 141 140 141  Potassium 3.5 - 5.1 mmol/L 3.4(L) 4.2 4.5  Chloride 98 - 111 mmol/L 106 104 104  CO2 22 - 32 mmol/L 24 30(H) 23  Calcium 8.9 - 10.3 mg/dL 9.1 9.6 9.7  Total Protein 6.0 - 8.5 g/dL - 7.3 7.2  Total Bilirubin 0.0 - 1.2 mg/dL - 0.4 0.4  Alkaline Phos 39 - 117 IU/L - 79 78  AST 0 - 40 IU/L - 13 13  ALT 0 - 32 IU/L - 11 8    Lipid Panel     Component Value Date/Time   CHOL 363 (H) 04/27/2019 1131   TRIG 85 04/27/2019 1131   HDL 46 04/27/2019 1131   CHOLHDL 7.9 (H) 04/27/2019 1131   CHOLHDL 3.7 04/18/2016 0832   VLDL 11 04/18/2016 0832   LDLCALC 304 (H) 04/27/2019 1131   LABVLDL 13 04/27/2019 1131   BMP Recent Labs    04/27/19 1116 05/20/19 1230 08/14/19 2251  NA 141 140 141  K 4.5 4.2 3.4*  CL 104 104 106  CO2 23 30* 24  GLUCOSE 84 97 103*  BUN 9 7 11   CREATININE 0.74 0.81 0.65  CALCIUM 9.7 9.6 9.1  GFRNONAA 94 84 >60  GFRAA 108 97 >60    HEMOGLOBIN A1C Lab Results  Component Value Date   HGBA1C 6.1 (H) 06/26/2017   MPG 123 04/18/2016     IMPRESSION:    ICD-10-CM   1. Essential hypertension  I10 EKG 12-Lead    PCV ECHOCARDIOGRAM COMPLETE  2. Family history of premature CAD  Z82.49 PCV ECHOCARDIOGRAM COMPLETE  3. Mixed dyslipidemia  E78.2   4. Coronary atherosclerosis due to calcified coronary lesion of native artery  I25.10  I25.84      RECOMMENDATIONS: SAYLA GOLONKA is a 49 y.o. 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   Benign essential hypertension:  Patient states that she was diagnosed with high blood pressure after her first Covid vaccine back in February 2021.  However, I did review old office notes and she had elevated blood pressures prior to her COVID vaccine that were consistent with either stage I or stage II hypertension. This was conveyed to the patient at today's office visit.  Patient states that she is hesitant to take antihypertensive medications due to the laundry list of side effects they are associated with.  However I also educated her that prolonged uncontrolled hypertension can lead to chronic comorbid conditions such as cardiovascular disease, stroke, chronic kidney disease, etc.   Patient was prescribed 5 mg of amlodipine p.o. daily but currently takes 2.5 p.o. every afternoon.  Requested that she starts taking 5 mg as prescribed by her PCP.  She may also benefit from hydrochlorothiazide 12.5 mg p.o. every morning the patient wants to hold off on additional antihypertensive medications at this time.  We also discussed nonpharmacological ways of improving high blood pressure.  We discussed low-salt diet, encouraged her to look into DASH diet or plant-based diet, and increasing physical activity as tolerated to 30 minutes a day 5 days a week.  Patient also mentioned that  she has history of hypokalemia and as a result does not want to be on diuretic therapy.  Thereafter we also discussed treatment with spironolactone which is a potassium sparing diuretic.  For now patient would like some time to think about it prior to uptitrating pharmacological therapy.  Patient states that she will follow-up with her PCP.  Family history of premature coronary artery disease:  Plan echocardiogram to evaluate for structural heart disease.  Recommend stress test once his blood pressure is better controlled.  Patient is currently on aspirin, statin therapy, Zetia.  Reviewed the most recent lipid profile from March 2021.  Patient's LDL levels are not at goal.  I have asked her to have this rechecked with her PCP.  Mixed dyslipidemia: See above.  Currently managed by primary care provider.  Patient also has a history of aneurysm repair with coiling and stenting done in 2011.  Given his history patient is encouraged to work on improving her high blood pressure.  He is also encouraged to follow-up with either interventional radiology or neurology for longitudinal care.  Will defer referral to specialist to her PCP at this time.  Spent extensive time discussing management of her chronic comorbid conditions at today's office visit.  Patient still remains reluctant to start uptitrating her antihypertensive medications and wants time to review the medical literature and discussed with PCP.   FINAL MEDICATION LIST END OF ENCOUNTER: No orders of the defined types were placed in this encounter.   There are no discontinued medications.   Current Outpatient Medications:  .  amLODipine (NORVASC) 2.5 MG tablet, Take 2.5 mg by mouth daily., Disp: , Rfl:  .  aspirin EC 325 MG tablet, Take 1 tablet (325 mg total) by mouth daily., Disp: 90 tablet, Rfl: 1 .  ezetimibe (ZETIA) 10 MG tablet, TAKE 1 TABLET BY MOUTH EVERY DAY, Disp: 90 tablet, Rfl: 3 .  rosuvastatin (CRESTOR) 20 MG tablet, Take  1  tablet (20 mg total) by mouth at bedtime., Disp: 90 tablet, Rfl: 3 .  amLODipine (NORVASC) 5 MG tablet, Take 1 tablet (5 mg total) by mouth daily. (Patient not taking: Reported on 10/03/2019), Disp: 30 tablet, Rfl: 2  Orders Placed This Encounter  Procedures  . EKG 12-Lead  . PCV ECHOCARDIOGRAM COMPLETE   --Continue cardiac medications as reconciled in final medication list. --Return in about 6 weeks (around 11/14/2019) for BP follow up., review test results.. Or sooner if needed. --Continue follow-up with your primary care physician regarding the management of your other chronic comorbid conditions.  Patient's questions and concerns were addressed to her satisfaction. She voices understanding of the instructions provided during this encounter.   This note was created using a voice recognition software as a result there may be grammatical errors inadvertently enclosed that do not reflect the nature of this encounter. Every attempt is made to correct such errors.  Tessa Lerner, [place], [facility]  Pager: [phone] Office: [phone]

## 2019-10-09 NOTE — Progress Notes (Signed)
Lets have her come in for fasting lipid recheck /office visit at her convenience

## 2019-10-12 NOTE — Progress Notes (Signed)
Patient has been informed and stated she will call back and schedule next week.

## 2019-10-18 ENCOUNTER — Other Ambulatory Visit: Payer: Self-pay

## 2019-10-18 ENCOUNTER — Ambulatory Visit: Payer: BC Managed Care – PPO

## 2019-10-18 DIAGNOSIS — I10 Essential (primary) hypertension: Secondary | ICD-10-CM

## 2019-10-18 DIAGNOSIS — Z8249 Family history of ischemic heart disease and other diseases of the circulatory system: Secondary | ICD-10-CM

## 2019-10-26 ENCOUNTER — Other Ambulatory Visit: Payer: Self-pay | Admitting: Medical

## 2019-10-26 NOTE — Telephone Encounter (Signed)
Will this be for cardiology to fill?

## 2019-11-24 ENCOUNTER — Ambulatory Visit: Payer: BC Managed Care – PPO | Admitting: Cardiology

## 2019-11-24 ENCOUNTER — Encounter: Payer: Self-pay | Admitting: Cardiology

## 2019-11-24 ENCOUNTER — Other Ambulatory Visit: Payer: Self-pay

## 2019-11-24 VITALS — BP 140/78 | HR 79 | Resp 17 | Ht 64.0 in | Wt 159.0 lb

## 2019-11-24 DIAGNOSIS — I10 Essential (primary) hypertension: Secondary | ICD-10-CM

## 2019-11-24 DIAGNOSIS — E782 Mixed hyperlipidemia: Secondary | ICD-10-CM

## 2019-11-24 DIAGNOSIS — I2584 Coronary atherosclerosis due to calcified coronary lesion: Secondary | ICD-10-CM

## 2019-11-24 DIAGNOSIS — Z8249 Family history of ischemic heart disease and other diseases of the circulatory system: Secondary | ICD-10-CM

## 2019-11-24 DIAGNOSIS — I2510 Atherosclerotic heart disease of native coronary artery without angina pectoris: Secondary | ICD-10-CM

## 2019-11-24 DIAGNOSIS — I340 Nonrheumatic mitral (valve) insufficiency: Secondary | ICD-10-CM

## 2019-11-24 MED ORDER — AMLODIPINE BESYLATE 5 MG PO TABS: 5.0000 mg | ORAL_TABLET | Freq: Two times a day (BID) | ORAL | 0 refills | Status: DC

## 2019-11-24 NOTE — Progress Notes (Signed)
Date:  11/24/2019   ID:  Debbie PoliceLeslie S Axtell, DOB 1970/02/28, MRN [id number]  PCP:  Jac Canavanysinger, David S, PA-C  Cardiologist:  Tessa LernerSunit Sonny Anthes, DO, [facility] (established care 10/03/2019) Former Cardiology Providers: Dr. Bryan Lemmaavid Harding.   Date: 11/24/19 Last Office Visit: 10/03/2019  Chief Complaint  Patient presents with  . Hypertension  . Follow-up    6 week  . Results    echo    HPI  Debbie [name] is a 49 y.o. 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   Patient is referred to the office for management of hypertension and family history of premature coronary artery disease.    At the last office visit patient was recommended to start taking her antihypertensive medications as prescribed by her PCP.  Patient states that she started taking Norvasc 2.5 mg p.o. twice daily and her home blood pressures average around 125/79 mmHg.  She is also increased her physical activity by walking on a regular basis and bikes about 22 minutes/day.  She has known family history of premature coronary artery disease. Patient's father had a myocardial infarction at the age of 51 and underwent bypass surgery.  Patient underwent coronary artery calcification score back in 2018 and her total score was 15 placing her at the 94th percentile for age and sex matched cohorts.  Patient is currently on aspirin and statin therapy.  Since last visit patient has undergone an echocardiogram which notes preserved left ventricular systolic function with mild to moderate valvular heart disease.  The results were reviewed with the patient in great detail at today's office visit and noted below for further  reference.  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 She use to see Dr. Corliss Skainseveshwar but has been lost to follow up.   Denies prior history of myocardial infarction, congestive heart failure, deep venous thrombosis, pulmonary embolism, stroke, transient ischemic attack.  FUNCTIONAL STATUS: Started walking at work and started to bike 22 minutes a day.   ALLERGIES: Allergies  Allergen Reactions  . Covid-19 (Adenovirus) Vaccine Other (See Comments)    High BP, pulses elevated, mild tongue swelling, mild dyspnea  . Beta Adrenergic Blockers     Spasm, inorgasmic, not willing to do beta blocker  . Contrast Media [Iodinated Diagnostic Agents]     For CT Angio, breaks out in hives  . Flexeril [Cyclobenzaprine]   . Lipitor [Atorvastatin Calcium] Other (See Comments)    Pt states she had a bad reaction several years ago when taking this  . Paxil [Paroxetine Hcl]     Tongue swelling had trouble swallowing   . Sulfa Antibiotics Other (See Comments)    Unknown reaction    MEDICATION LIST PRIOR TO VISIT: Current Meds  Medication Sig  . aspirin EC 325 MG tablet Take 1 tablet (325 mg total) by mouth daily.  Marland Kitchen. ezetimibe (ZETIA) 10 MG tablet TAKE 1 TABLET BY MOUTH EVERY DAY  . rosuvastatin (CRESTOR) 20 MG tablet Take 1 tablet (20 mg total) by mouth at bedtime.  . [DISCONTINUED] amLODipine (NORVASC) 5 MG tablet Take 0.5 tablets (2.5 mg total) by mouth daily.     PAST MEDICAL HISTORY: Past Medical History:  Diagnosis Date  . Anemia    history of  . Aneurysm of carotid artery (HCC)    hx/o - coiling 02/07/2010  . Chronic headache    since aneurysm diagnosis. mostly related to constipation as of 2017  . Coronary artery calcification   . Edema   . Familial hypercholesteremia    LDL over 300; lipoprotein testing 01/2009  . Family history of heart disease in female family member before age 76    LDL over 300 prior  . H/O  cardiovascular stress test 12/08/05   fair exercise capacity, no ST segment changes suggestive of ischemia  . H/O echocardiogram 03/22/01   mild asymmetric LVH, mild aortic sclerosis, mild mitral regurgitation  . History of blood transfusion    x2 due to heavy bleeding and anemia from fibroids  . Hx of CT scan of chest 09/2016   Coronary calcium score of 15. This was 27 percentile for age and sex  . Hypertension   . Prediabetes   . S/P partial hysterectomy    due to uterine fibroids  . 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 Nigel Sloop)  . Vitamin D deficiency   . Wears glasses     PAST SURGICAL HISTORY: Past Surgical History:  Procedure Laterality Date  . ANEURYSM COILING  02/07/10   stent assisted coiling of right internal carotid artery aneurysm in periophthalmic region  . CESAREAN SECTION    . COLONOSCOPY  2011   Dr. Loreta Ave, anemia workup  . ESOPHAGOGASTRODUODENOSCOPY  2011   Dr. Loreta Ave  . HERNIA REPAIR     epigastric hernia repair age 72, right inguinal hernia as a child  . PARTIAL HYSTERECTOMY     abdominal due to fibroids, still has ovaries, Dr. Layne Benton  . TUBAL LIGATION    . tubal reversal  2008    FAMILY HISTORY: The patient family history includes Aneurysm in her sister; Cancer in her maternal grandmother, mother, paternal grandfather, and another family member; Diabetes in her father; Heart disease (age of onset: 61) in her sister; Heart disease (age of onset: 8) in her father; Heart failure in her sister and another family member; Hyperlipidemia in her father; Hypertension in her mother and sister; Obesity in her mother; Stroke in her maternal grandfather.  SOCIAL HISTORY:  The patient  reports that she has never smoked. She has never used smokeless tobacco. She reports that she does not drink alcohol and does not use drugs.  REVIEW OF SYSTEMS: Review of Systems  Constitutional: Negative for  chills and fever.  HENT: Negative for hoarse voice and nosebleeds.   Eyes: Negative for discharge, double vision and pain.  Cardiovascular: Negative for chest pain, claudication, dyspnea on exertion, leg swelling, near-syncope, orthopnea, palpitations, paroxysmal nocturnal dyspnea and syncope.  Respiratory: Negative for hemoptysis and shortness of breath.   Musculoskeletal: Negative for muscle cramps and myalgias.  Gastrointestinal: Negative for abdominal pain, constipation, diarrhea, hematemesis, hematochezia, melena, nausea and vomiting.  Neurological: Negative for dizziness.   PHYSICAL EXAM: Vitals with BMI 11/24/2019 10/03/2019 08/22/2019  Height 5\' 4"  5\' 4"  5\' 4"   Weight 159 lbs 157 lbs 152 lbs 6 oz  BMI 27.28 26.94 26.15  Systolic 140 150  Diastolic 78 81 90  Pulse 79 76 89   CONSTITUTIONAL: Well-developed and well-nourished. No acute distress.  SKIN: Skin is warm and dry. No rash noted. No cyanosis. No pallor. No jaundice HEAD: Normocephalic and atraumatic.  EYES: No  scleral icterus MOUTH/THROAT: Moist oral membranes.  NECK: No JVD present. No thyromegaly noted. No carotid bruits  LYMPHATIC: No visible cervical adenopathy.  CHEST Normal respiratory effort. No intercostal retractions  LUNGS: Clear to auscultation bilaterally. No stridor. No wheezes. No rales.  CARDIOVASCULAR: Regular rate and rhythm, positive S1-S2, soft holosystolic murmur heard at the apex, no rubs or gallops appreciated. ABDOMINAL: No apparent ascites.  EXTREMITIES: No peripheral edema  HEMATOLOGIC: No significant bruising NEUROLOGIC: Oriented to person, place, and time. Nonfocal. Normal muscle tone.  PSYCHIATRIC: Normal mood and affect. Normal behavior. Cooperative  RADIOLOGY: IR angiogram carotid cerebral bilateral: February 12, 2010 approximately 3 mm x 2.2 mm wide neck right internal carotid artery periophthalmic region aneurysm.   CARDIAC DATABASE: EKG: 10/03/2019: NSR, 75bpm, PRWP, low voltage, no  underlying injury pattern.  Echocardiogram: 10/18/2019: Left ventricle cavity is normal in size. Mild concentric hypertrophy of the left ventricle. Normal global wall motion. Normal LV systolic function with EF 65%. Normal diastolic filling pattern. Moderate (Grade II) mitral regurgitation. Mild tricuspid regurgitation. No evidence of pulmonary hypertension.  Stress Testing: NA  CT Cardiac Scoring: 10/03/2016: Coronary calcium score of 15. This was 59 percentile for age and sex matched control.  Heart Catheterization: None  LABORATORY DATA: CBC Latest Ref Rng & Units 08/14/2019 05/20/2019 04/27/2019  WBC 4.0 - 10.5 K/uL 9.3 7.5 6.1  Hemoglobin 12.0 - 15.0 g/dL 36.1 44.3 15.4  Hematocrit 36 - 46 % 44.0 39.4 40.4  Platelets 150 - 400 K/uL 336 372 306    CMP Latest Ref Rng & Units 08/14/2019 05/20/2019 04/27/2019  Glucose 70 - 99 mg/dL 008(Q) 97 84  BUN 6 - 20 mg/dL 11 7 9   Creatinine 0.44 - 1.00 mg/dL 7.61 9.50  Sodium 135 - 145 mmol/L 141 140 141  Potassium 3.5 - 5.1 mmol/L 3.4(L) 4.2 4.5  Chloride 98 - 111 mmol/L 106 104 104  CO2 22 - 32 mmol/L 24 30(H) 23  Calcium 8.9 - 10.3 mg/dL 9.1 9.6 9.7  Total Protein 6.0 - 8.5 g/dL - 7.3 7.2  Total Bilirubin 0.0 - 1.2 mg/dL - 0.4 0.4  Alkaline Phos 39 - 117 IU/L - 79 78  AST 0 - 40 IU/L - 13 13  ALT 0 - 32 IU/L - 11 8    Lipid Panel     Component Value Date/Time   CHOL 363 (H) 04/27/2019 1131   TRIG 85 04/27/2019 1131   HDL 46 04/27/2019 1131   CHOLHDL 7.9 (H) 04/27/2019 1131   CHOLHDL 3.7 04/18/2016 0832   VLDL 11 04/18/2016 0832   LDLCALC 304 (H) 04/27/2019 1131   LABVLDL 13 04/27/2019 1131   BMP Recent Labs    04/27/19 1116 05/20/19 1230 08/14/19 2251  NA 141 140 141  K 4.5 4.2 3.4*  CL 104 104 106  CO2 23 30* 24  GLUCOSE 84 97 103*  BUN 9 7 11   CREATININE 0.74 0.81 0.65  CALCIUM 9.7 9.6 9.1  GFRNONAA 94 84 >60  GFRAA 108 97 >60    HEMOGLOBIN A1C Lab Results  Component Value Date   HGBA1C 6.1 (H)  06/26/2017   MPG 123 04/18/2016    IMPRESSION:    ICD-10-CM   1. Essential hypertension  I10 amLODipine (NORVASC) 5 MG tablet  2. Family history of premature CAD  Z82.49 PCV MYOCARDIAL PERFUSION WO LEXISCAN    SARS-COV-2 RNA,(COVID-19) QUAL NAAT  3. Mixed dyslipidemia  E78.2   4. Coronary atherosclerosis due to calcified coronary lesion of native  artery  I25.10 PCV MYOCARDIAL PERFUSION WO LEXISCAN   I25.84 SARS-COV-2 RNA,(COVID-19) QUAL NAAT  5. Nonrheumatic mitral valve regurgitation  I34.0 PCV ECHOCARDIOGRAM COMPLETE     RECOMMENDATIONS: Debbie [name] is a 49 y.o. 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   Benign essential hypertension:  Recommend the patient increase her Norvasc 10 mg p.o. daily.  Patient feels more comfortable taking the medication twice daily dosing.  We will provide a prescription for Norvasc 5 mg p.o. twice daily.  Recommend blood pressure to be around 120 mmHg on average.    We also discussed nonpharmacological ways of improving high blood pressure.  We discussed low-salt diet, encouraged her to look into DASH diet or plant-based diet, and increasing physical activity as tolerated to 30 minutes a day 5 days a week.  Moderate mitral regurgitation: Continue to monitor.  Recommend 1 year follow-up echocardiogram to reevaluate progression.  Family history of premature coronary artery disease:  Echocardiogram results reviewed with the patient and noted above for further reference.    Given her underlying coronary artery calcification and family history of premature coronary disease recommend exercise nuclear stress test to evaluate for reversible ischemia and to evaluate her underlying functional status.  Patient is currently on aspirin,  statin therapy, Zetia.  Reviewed the most recent lipid profile from March 2021.  Patient's LDL levels are not at goal.  I have asked her to have this rechecked with her PCP.  Mixed dyslipidemia: See above.  Currently managed by primary care provider.  Patient also has a history of aneurysm repair with coiling and stenting done in 2011.  Given his history patient is encouraged to work on improving her high blood pressure.  He is also encouraged to follow-up with either interventional radiology or neurology for longitudinal care.  Will defer referral to specialist to her PCP at this time.  FINAL MEDICATION LIST END OF ENCOUNTER: Meds ordered this encounter  Medications  . amLODipine (NORVASC) 5 MG tablet    Sig: Take 1 tablet (5 mg total) by mouth in the morning and at bedtime.    Dispense:  180 tablet    Refill:  0     Current Outpatient Medications:  .  aspirin EC 325 MG tablet, Take 1 tablet (325 mg total) by mouth daily., Disp: 90 tablet, Rfl: 1 .  ezetimibe (ZETIA) 10 MG tablet, TAKE 1 TABLET BY MOUTH EVERY DAY, Disp: 90 tablet, Rfl: 3 .  rosuvastatin (CRESTOR) 20 MG tablet, Take 1 tablet (20 mg total) by mouth at bedtime., Disp: 90 tablet, Rfl: 3 .  amLODipine (NORVASC) 5 MG tablet, Take 1 tablet (5 mg total) by mouth in the morning and at bedtime., Disp: 180 tablet, Rfl: 0  Orders Placed This Encounter  Procedures  . SARS-COV-2 RNA,(COVID-19) QUAL NAAT  . PCV MYOCARDIAL PERFUSION WO LEXISCAN  . PCV ECHOCARDIOGRAM COMPLETE   --Continue cardiac medications as reconciled in final medication list. --Return in about 6 weeks (around 01/05/2020) for Reevaluation of, BP, Review test results. Or sooner if needed. --Continue follow-up with your primary care physician regarding the management of your other chronic comorbid conditions.  Patient's questions and concerns were addressed to her satisfaction. She voices understanding of the instructions provided during this encounter.   This  note was created using a voice recognition software as  a result there may be grammatical errors inadvertently enclosed that do not reflect the nature of this encounter. Every attempt is made to correct such errors.  Rex Kras, [place], [facility]  Pager: [phone] Office: [phone]

## 2019-11-29 ENCOUNTER — Other Ambulatory Visit: Payer: Self-pay

## 2019-11-29 DIAGNOSIS — I2510 Atherosclerotic heart disease of native coronary artery without angina pectoris: Secondary | ICD-10-CM

## 2019-11-29 DIAGNOSIS — Z8249 Family history of ischemic heart disease and other diseases of the circulatory system: Secondary | ICD-10-CM

## 2019-11-29 DIAGNOSIS — I340 Nonrheumatic mitral (valve) insufficiency: Secondary | ICD-10-CM

## 2019-12-02 ENCOUNTER — Other Ambulatory Visit (HOSPITAL_COMMUNITY)
Admission: RE | Admit: 2019-12-02 | Discharge: 2019-12-02 | Disposition: A | Discharge status: Home / Self Care | Payer: BC Managed Care – PPO | Source: Ambulatory Visit | Attending: Cardiology | Admitting: Cardiology

## 2019-12-02 DIAGNOSIS — Z20822 Contact with and (suspected) exposure to covid-19: Secondary | ICD-10-CM | POA: Diagnosis not present

## 2019-12-02 DIAGNOSIS — Z01818 Encounter for other preprocedural examination: Secondary | ICD-10-CM | POA: Insufficient documentation

## 2019-12-02 LAB — SARS CORONAVIRUS 2 (TAT 6-24 HRS): SARS Coronavirus 2: NEGATIVE

## 2019-12-03 ENCOUNTER — Other Ambulatory Visit (HOSPITAL_COMMUNITY): Payer: Self-pay

## 2019-12-05 ENCOUNTER — Ambulatory Visit: Payer: BC Managed Care – PPO

## 2019-12-05 ENCOUNTER — Other Ambulatory Visit: Payer: Self-pay

## 2019-12-07 NOTE — Progress Notes (Signed)
Spoke to patient, she is aware.

## 2019-12-11 ENCOUNTER — Other Ambulatory Visit: Payer: Self-pay | Admitting: Medical

## 2019-12-28 ENCOUNTER — Telehealth: Payer: Self-pay | Admitting: Medical

## 2019-12-28 NOTE — Telephone Encounter (Signed)
Patient stated she still has it and she will go ahead and do it. She thought it was expired. Patient informed it is not expired and she can still send.

## 2019-12-28 NOTE — Telephone Encounter (Signed)
Please call patient...  I received a notice from [facility] that they have not completed the Cologuard test that we ordered.    Please call and inquire to see if they are having concerns about the test or not sure how to do the test?  I want to make sure they get her colon cancer screening done since they are due for screening.  See if they have any other questions?

## 2019-12-28 NOTE — Telephone Encounter (Signed)
Ok, please encourage her to have a bowel movement and send in the test for screening.

## 2020-01-06 ENCOUNTER — Encounter: Payer: Self-pay | Admitting: Cardiology

## 2020-01-06 ENCOUNTER — Other Ambulatory Visit: Payer: Self-pay

## 2020-01-06 ENCOUNTER — Ambulatory Visit: Payer: BC Managed Care – PPO | Admitting: Cardiology

## 2020-01-06 VITALS — BP 138/82 | HR 81 | Resp 16 | Ht 64.0 in | Wt 160.0 lb

## 2020-01-06 DIAGNOSIS — E782 Mixed hyperlipidemia: Secondary | ICD-10-CM

## 2020-01-06 DIAGNOSIS — I340 Nonrheumatic mitral (valve) insufficiency: Secondary | ICD-10-CM

## 2020-01-06 DIAGNOSIS — Z8249 Family history of ischemic heart disease and other diseases of the circulatory system: Secondary | ICD-10-CM

## 2020-01-06 DIAGNOSIS — Z712 Person consulting for explanation of examination or test findings: Secondary | ICD-10-CM

## 2020-01-06 DIAGNOSIS — I10 Essential (primary) hypertension: Secondary | ICD-10-CM

## 2020-01-06 DIAGNOSIS — I2584 Coronary atherosclerosis due to calcified coronary lesion: Secondary | ICD-10-CM

## 2020-01-06 DIAGNOSIS — I2510 Atherosclerotic heart disease of native coronary artery without angina pectoris: Secondary | ICD-10-CM

## 2020-01-06 NOTE — Progress Notes (Signed)
Date:  01/06/2020   ID:  Keane Police, DOB [birth year], MRN [id number]  PCP:  Jac Canavan, PA-C  Cardiologist:  Tessa Lerner, DO, [facility] (established care 10/03/2019) Former Cardiology Providers: Dr. Bryan Lemma.   Date: 01/06/20 Last Office Visit: 11/24/2019  Chief Complaint  Patient presents with  . Hypertension  . Follow-up  . Results    HPI  Debbie [name] is a 49 y.o. 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   Patient is referred to the office for management of hypertension and family history of premature coronary artery disease.    Since last office visit patient's blood pressures have been well controlled at home. During her last visit she was recommended to start 5 mg of amlodipine twice daily. Patient chose to do a twice daily dosing as opposed to daily dosing. Patient states that home blood pressures are consistently less than 120 mmHg systolic and her diastolic blood pressures are around 70 mmHg.  Given her coronary artery calcification and dyslipidemia she underwent stress test since last office visit. Reviewed the results of the stress test with her in great detail at today's office visit and findings noted below for further reference.  She still has a follow-up with her primary care providers for a repeat lipid profile which she plans to do in the next month or so.  I have also recommended that she discuss with her PCP regarding either seeing interventional radiology or neurology given her history of aneurysm coiling and stenting back in 2011.  Will defer further management to her PCP.  FUNCTIONAL STATUS: Stationary bike every day for  5 mile in 20 minutes.  ALLERGIES: Allergies  Allergen Reactions  . Covid-19 (Adenovirus) Vaccine Other (See Comments)    High BP, pulses elevated, mild tongue swelling, mild dyspnea  . Beta Adrenergic Blockers     Spasm, inorgasmic, not willing to do beta blocker  . Contrast Media [Iodinated Diagnostic Agents]     For CT Angio, breaks out in hives  . Flexeril [Cyclobenzaprine]   . Lipitor [Atorvastatin Calcium] Other (See Comments)    Pt states she had a bad reaction several years ago when taking this  . Paxil [Paroxetine Hcl]     Tongue swelling had trouble swallowing   . Sulfa Antibiotics Other (See Comments)    Unknown reaction    MEDICATION LIST PRIOR TO VISIT: Current Meds  Medication Sig  . amLODipine (NORVASC) 5 MG tablet Take 1 tablet (5 mg total) by mouth in the morning and at bedtime.  . CVS ASPIRIN EC 325 MG EC tablet TAKE 1 TABLET BY MOUTH EVERY DAY  . ezetimibe (ZETIA) 10 MG tablet TAKE 1 TABLET BY MOUTH EVERY DAY  . rosuvastatin (CRESTOR) 20 MG tablet Take 1 tablet (20 mg total) by mouth at bedtime.     PAST MEDICAL HISTORY: Past Medical History:  Diagnosis Date  . Anemia    history of  . Aneurysm of carotid artery (HCC)    hx/o - coiling 02/07/2010  . Chronic headache    since aneurysm diagnosis. mostly related to constipation as of 2017  . Coronary artery calcification   . Edema   . Familial hypercholesteremia    LDL  over 300; lipoprotein testing 01/2009  . Family history of heart disease in female family member before age 51    LDL over 300 prior  . H/O cardiovascular stress test 12/08/05   fair exercise capacity, no ST segment changes suggestive of ischemia  . H/O echocardiogram 03/22/01   mild asymmetric LVH, mild aortic sclerosis, mild mitral regurgitation  . History of blood transfusion    x2 due to heavy bleeding and anemia from fibroids  . Hx of CT scan of chest 09/2016   Coronary calcium score of 15. This was 6494 percentile for age and sex  .  Hypertension   . Prediabetes   . S/P partial hysterectomy    due to uterine fibroids  . 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 Nigel Sloopevaschwar)  . Vitamin D deficiency   . Wears glasses     PAST SURGICAL HISTORY: Past Surgical History:  Procedure Laterality Date  . ANEURYSM COILING  02/07/10   stent assisted coiling of right internal carotid artery aneurysm in periophthalmic region  . CESAREAN SECTION    . COLONOSCOPY  2011   Dr. Loreta AveMann, anemia workup  . ESOPHAGOGASTRODUODENOSCOPY  2011   Dr. Loreta AveMann  . HERNIA REPAIR     epigastric hernia repair age 49, right inguinal hernia as a child  . PARTIAL HYSTERECTOMY     abdominal due to fibroids, still has ovaries, Dr. Layne BentonBrevard  . TUBAL LIGATION    . tubal reversal  2008    FAMILY HISTORY: The patient family history includes Aneurysm in her sister; Cancer in her maternal grandmother, mother, paternal grandfather, and another family member; Diabetes in her father; Heart disease (age of onset: 7344) in her sister; Heart disease (age of onset: 6547) in her father; Heart failure in her sister and another family member; Hyperlipidemia in her father; Hypertension in her mother and sister; Obesity in her mother; Stroke in her maternal grandfather.  SOCIAL HISTORY:  The patient  reports that she has never smoked. She has never used smokeless tobacco. She reports that she does not drink alcohol and does not use drugs.  REVIEW OF SYSTEMS: Review of Systems  Constitutional: Negative for chills and fever.  HENT: Negative for hoarse voice and nosebleeds.   Eyes: Negative for discharge, double vision and pain.  Cardiovascular: Negative for chest pain, claudication, dyspnea on exertion, leg swelling, near-syncope, orthopnea, palpitations, paroxysmal nocturnal dyspnea and syncope.  Respiratory: Negative for hemoptysis and shortness of breath.   Musculoskeletal: Negative for muscle cramps  and myalgias.  Gastrointestinal: Negative for abdominal pain, constipation, diarrhea, hematemesis, hematochezia, melena, nausea and vomiting.  Neurological: Negative for dizziness.   PHYSICAL EXAM: Vitals with BMI 01/06/2020 11/24/2019 10/03/2019  Height 5\' 4"  5\' 4"  5\' 4"   Weight 160 lbs 159 lbs 157 lbs  BMI 27.45 27.28 26.94  Systolic 138 140 [id number]  Diastolic 82 78 81  Pulse 81 79 76   CONSTITUTIONAL: Well-developed and well-nourished. No acute distress.  SKIN: Skin is warm and dry. No rash noted. No cyanosis. No pallor. No jaundice HEAD: Normocephalic and atraumatic.  EYES: No scleral icterus MOUTH/THROAT: Moist oral membranes.  NECK: No JVD present. No thyromegaly noted. No carotid bruits  LYMPHATIC: No visible cervical adenopathy.  CHEST Normal respiratory effort. No intercostal retractions  LUNGS: Clear to auscultation bilaterally. No stridor. No wheezes. No rales.  CARDIOVASCULAR: Regular rate and rhythm, positive S1-S2, soft holosystolic murmur heard at the apex, no rubs or  gallops appreciated. ABDOMINAL: No apparent ascites.  EXTREMITIES: No peripheral edema  HEMATOLOGIC: No significant bruising NEUROLOGIC: Oriented to person, place, and time. Nonfocal. Normal muscle tone.  PSYCHIATRIC: Normal mood and affect. Normal behavior. Cooperative  RADIOLOGY: IR angiogram carotid cerebral bilateral: February 12, 2010 approximately 3 mm x 2.2 mm wide neck right internal carotid artery periophthalmic region aneurysm.   CARDIAC DATABASE: EKG: 10/03/2019: NSR, 75bpm, PRWP, low voltage, no underlying injury pattern.  Echocardiogram: 10/18/2019: Left ventricle cavity is normal in size. Mild concentric hypertrophy of the left ventricle. Normal global wall motion. Normal LV systolic function with EF 65%. Normal diastolic filling pattern. Moderate (Grade II) mitral regurgitation. Mild tricuspid regurgitation. No evidence of pulmonary hypertension.  Stress Testing: Exercise Sestamibi stress  test 12/05/2019: Exercise nuclear stress test was performed using Bruce protocol. Patient reached 8.6 METS, and 99% of age predicted maximum heart rate. Exercise capacity was fair. Chest pain not reported. Heart rate and hemodynamic response were normal. Stress EKG revealed no ischemic changes. Normal myocardial perfusion. Stress LVEF 51%. Low risk study.   CT Cardiac Scoring: 10/03/2016: Coronary calcium score of 15. This was 86 percentile for age and sex matched control.  Heart Catheterization: None  LABORATORY DATA: CBC Latest Ref Rng & Units 08/14/2019 05/20/2019 04/27/2019  WBC 4.0 - 10.5 K/uL 9.3 7.5 6.1  Hemoglobin 12.0 - 15.0 g/dL 12.4 58.0 99.8  Hematocrit 36 - 46 % 44.0 39.4 40.4  Platelets 150 - 400 K/uL 336 372 306    CMP Latest Ref Rng & Units 08/14/2019 05/20/2019 04/27/2019  Glucose 70 - 99 mg/dL 338(S) 97 84  BUN 6 - 20 mg/dL 11 7 9   Creatinine 0.44 - 1.00 mg/dL 5.05 3.97  Sodium 135 - 145 mmol/L 141 140 141  Potassium 3.5 - 5.1 mmol/L 3.4(L) 4.2 4.5  Chloride 98 - 111 mmol/L 106 104 104  CO2 22 - 32 mmol/L 24 30(H) 23  Calcium 8.9 - 10.3 mg/dL 9.1 9.6 9.7  Total Protein 6.0 - 8.5 g/dL - 7.3 7.2  Total Bilirubin 0.0 - 1.2 mg/dL - 0.4 0.4  Alkaline Phos 39 - 117 IU/L - 79 78  AST 0 - 40 IU/L - 13 13  ALT 0 - 32 IU/L - 11 8    Lipid Panel  Lab Results  Component Value Date   CHOL 363 (H) 04/27/2019   HDL 46 04/27/2019   LDLCALC 304 (H) 04/27/2019   TRIG 85 04/27/2019   CHOLHDL 7.9 (H) 04/27/2019   BMP Recent Labs    04/27/19 1116 05/20/19 1230 08/14/19 2251  NA 141 140 141  K 4.5 4.2 3.4*  CL 104 104 106  CO2 23 30* 24  GLUCOSE 84 97 103*  BUN 9 7 11   CREATININE 0.74 0.81 0.65  CALCIUM 9.7 9.6 9.1  GFRNONAA 94 84 >60  GFRAA 108 97 >60    HEMOGLOBIN A1C Lab Results  Component Value Date   HGBA1C 6.1 (H) 06/26/2017   MPG 123 04/18/2016    IMPRESSION:    ICD-10-CM   1. Essential hypertension  I10   2. Family history of premature CAD   Z82.49   3. Coronary atherosclerosis due to calcified coronary lesion of native artery  I25.10    I25.84   4. Mixed dyslipidemia  E78.2   5. Nonrheumatic mitral valve regurgitation  I34.0      RECOMMENDATIONS: Debbie [name] is a 49 y.o. 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   Family history of premature coronary artery disease:  Reviewed the results of the most recent stress test with the patient at today's visit.  No additional cardiovascular testing needed at this time.  Recommended working on improving modifiable cardiovascular risk factors.  Blood pressures are at goal.  Patient is asked to follow-up with her PCP for a fasting lipid profile and to uptitrate statin therapy if needed.  Continue aspirin, statin therapy, Zetia.  Benign essential hypertension:  Home blood pressures are relatively at goal.  Continue current antihypertensive medications.  We also discussed nonpharmacological ways of improving high blood pressure.  We discussed low-salt diet, encouraged her to look into DASH diet or plant-based diet, and increasing physical activity as tolerated to 30 minutes a day 5 days a week.  Moderate mitral regurgitation: Continue to monitor.  Recommend 1 year follow-up echocardiogram to reevaluate progression.  Mixed dyslipidemia: Recommended repeat fasting lipid profile.  Patient states that she has an upcoming follow-up visit with her PCP and will have it performed at that time.  She is asked to send [place] a copy for reference.  Currently managed by primary care provider.  FINAL MEDICATION LIST END OF ENCOUNTER: No orders of the defined types were placed in this encounter.    Current Outpatient Medications:  .  amLODipine (NORVASC) 5 MG tablet, Take 1  tablet (5 mg total) by mouth in the morning and at bedtime., Disp: 180 tablet, Rfl: 0 .  CVS ASPIRIN EC 325 MG EC tablet, TAKE 1 TABLET BY MOUTH EVERY DAY, Disp: 90 tablet, Rfl: 0 .  ezetimibe (ZETIA) 10 MG tablet, TAKE 1 TABLET BY MOUTH EVERY DAY, Disp: 90 tablet, Rfl: 3 .  rosuvastatin (CRESTOR) 20 MG tablet, Take 1 tablet (20 mg total) by mouth at bedtime., Disp: 90 tablet, Rfl: 3  No orders of the defined types were placed in this encounter.  --Continue cardiac medications as reconciled in final medication list. --Return in about 1 year (around 01/05/2021) for Follow up valvular heart disease and , Coronary artery calcification. Or sooner if needed. --Continue follow-up with your primary care physician regarding the management of your other chronic comorbid conditions.  Patient's questions and concerns were addressed to her satisfaction. She voices understanding of the instructions provided during this encounter.   This note was created using a voice recognition software as a result there may be grammatical errors inadvertently enclosed that do not reflect the nature of this encounter. Every attempt is made to correct such errors.  Tessa Lerner, [place], [facility]  Pager: [phone] Office: [phone]

## 2020-02-27 ENCOUNTER — Other Ambulatory Visit: Payer: Self-pay | Admitting: Cardiology

## 2020-02-27 DIAGNOSIS — I10 Essential (primary) hypertension: Secondary | ICD-10-CM

## 2020-03-09 ENCOUNTER — Encounter: Payer: Self-pay | Admitting: Family Medicine

## 2020-03-09 ENCOUNTER — Other Ambulatory Visit (INDEPENDENT_AMBULATORY_CARE_PROVIDER_SITE_OTHER): Payer: BC Managed Care – PPO

## 2020-03-09 ENCOUNTER — Telehealth: Payer: BC Managed Care – PPO | Admitting: Family Medicine

## 2020-03-09 VITALS — Temp 98.3°F | Wt 153.0 lb

## 2020-03-09 DIAGNOSIS — J029 Acute pharyngitis, unspecified: Secondary | ICD-10-CM

## 2020-03-09 LAB — POC COVID19 BINAXNOW: SARS Coronavirus 2 Ag: NEGATIVE

## 2020-03-09 NOTE — Progress Notes (Signed)
   Subjective:    Patient ID: Debbie [name], female    DOB: [birth year], 50 y.o.   MRN: [id number]  HPI I connected with  SIAH KANNAN on 03/09/20 by a video enabled telemedicine application and verified that I am speaking with the correct person using two identifiers.  Caregility used. I'm at home. Patient at home I discussed the limitations of evaluation and management by telemedicine. The patient expressed understanding and agreed to proceed. She has a 1 day history of postnasal drainage, sore throat and slight cough.  No fever, chills, earache.  She did have 1 COVID-vaccine but had an adverse reaction with tongue and lip swelling.   Review of Systems     Objective:   Physical Exam Alert and in no distress otherwise not examined       Assessment & Plan:  Sore throat - Plan: POC COVID-19 BinaxNow, Novel Coronavirus, NAA ([facility]) I explained that we need to test her for COVID since she is not fully vaccinated.  Discussed treatment of her symptoms with Claritin and gargling for her slight sore throat. 20 minutes spent in reviewing her record today, evaluation counseling and management.

## 2020-03-14 LAB — NOVEL CORONAVIRUS, NAA: SARS-CoV-2, NAA: DETECTED — AB

## 2020-03-15 ENCOUNTER — Telehealth: Payer: Self-pay

## 2020-03-15 NOTE — Telephone Encounter (Signed)
Called to discuss with patient about COVID-19 symptoms and the use of one of the available treatments for those with mild to moderate Covid symptoms and at a high risk of hospitalization.  Pt appears to qualify for outpatient treatment due to co-morbid conditions and/or a member of an at-risk group in accordance with the FDA Emergency Use Authorization.    Symptom onset: 03/08/20 Vaccinated:  X 1 Booster? No Immunocompromised? No Qualifiers: Asthma  Unable to reach pt - Reached pt.   Debbie [name] A Waynesha Rammel  Pt. Declines any further treatment. Feeling better.

## 2020-04-17 ENCOUNTER — Other Ambulatory Visit: Payer: Self-pay | Admitting: Medical

## 2020-05-01 ENCOUNTER — Other Ambulatory Visit: Payer: Self-pay | Admitting: Medical

## 2020-05-03 ENCOUNTER — Other Ambulatory Visit: Payer: Self-pay | Admitting: Medical

## 2020-05-07 ENCOUNTER — Other Ambulatory Visit: Payer: Self-pay | Admitting: Medical

## 2020-06-07 ENCOUNTER — Telehealth: Payer: Self-pay

## 2020-06-07 NOTE — Telephone Encounter (Signed)
Recv'd a fax that brand Crestor not covered

## 2020-06-16 NOTE — Telephone Encounter (Signed)
P.A. BRAND CRESTOR  °

## 2020-06-20 ENCOUNTER — Encounter (HOSPITAL_BASED_OUTPATIENT_CLINIC_OR_DEPARTMENT_OTHER): Payer: Self-pay | Admitting: *Deleted

## 2020-06-20 ENCOUNTER — Other Ambulatory Visit: Payer: Self-pay

## 2020-06-20 ENCOUNTER — Emergency Department (HOSPITAL_BASED_OUTPATIENT_CLINIC_OR_DEPARTMENT_OTHER): Payer: BC Managed Care – PPO

## 2020-06-20 DIAGNOSIS — R002 Palpitations: Secondary | ICD-10-CM | POA: Diagnosis present

## 2020-06-20 DIAGNOSIS — I491 Atrial premature depolarization: Secondary | ICD-10-CM | POA: Diagnosis not present

## 2020-06-20 LAB — CBC WITH DIFFERENTIAL/PLATELET
Abs Immature Granulocytes: 0.02 10*3/uL (ref 0.00–0.07)
Basophils Absolute: 0.1 10*3/uL (ref 0.0–0.1)
Basophils Relative: 1 %
Eosinophils Absolute: 0.2 10*3/uL (ref 0.0–0.5)
Eosinophils Relative: 2 %
HCT: 41.3 % (ref 36.0–46.0)
Hemoglobin: 13.4 g/dL (ref 12.0–15.0)
Immature Granulocytes: 0 %
Lymphocytes Relative: 42 %
Lymphs Abs: 3.4 10*3/uL (ref 0.7–4.0)
MCH: 28.6 pg (ref 26.0–34.0)
MCHC: 32.4 g/dL (ref 30.0–36.0)
MCV: 88.2 fL (ref 80.0–100.0)
Monocytes Absolute: 0.6 10*3/uL (ref 0.1–1.0)
Monocytes Relative: 8 %
Neutro Abs: 3.7 10*3/uL (ref 1.7–7.7)
Neutrophils Relative %: 47 %
Platelets: 335 10*3/uL (ref 150–400)
RBC: 4.68 MIL/uL (ref 3.87–5.11)
RDW: 12.6 % (ref 11.5–15.5)
WBC: 7.9 10*3/uL (ref 4.0–10.5)
nRBC: 0 % (ref 0.0–0.2)

## 2020-06-20 NOTE — ED Triage Notes (Signed)
C/o palpatations x 3 days SOb x 1 days

## 2020-06-20 NOTE — ED Notes (Signed)
Patient transported to X-ray 

## 2020-06-21 ENCOUNTER — Emergency Department (HOSPITAL_BASED_OUTPATIENT_CLINIC_OR_DEPARTMENT_OTHER)
Admission: EM | Admit: 2020-06-21 | Discharge: 2020-06-21 | Disposition: A | Discharge status: Home / Self Care | Payer: BC Managed Care – PPO | Attending: Emergency Medicine | Admitting: Emergency Medicine

## 2020-06-21 DIAGNOSIS — I491 Atrial premature depolarization: Secondary | ICD-10-CM

## 2020-06-21 LAB — COMPREHENSIVE METABOLIC PANEL
ALT: 15 U/L (ref 0–44)
AST: 18 U/L (ref 15–41)
Albumin: 4 g/dL (ref 3.5–5.0)
Alkaline Phosphatase: 72 U/L (ref 38–126)
Anion gap: 10 (ref 5–15)
BUN: 11 mg/dL (ref 6–20)
CO2: 25 mmol/L (ref 22–32)
Calcium: 9.4 mg/dL (ref 8.9–10.3)
Chloride: 102 mmol/L (ref 98–111)
Creatinine, Ser: 0.72 mg/dL (ref 0.44–1.00)
GFR, Estimated: >60 mL/min (ref >60)
Glucose, Bld: 128 mg/dL — ABNORMAL HIGH (ref 70–99)
Potassium: 3.6 mmol/L (ref 3.5–5.1)
Sodium: 137 mmol/L (ref 135–145)
Total Bilirubin: 0.5 mg/dL (ref 0.3–1.2)
Total Protein: 7.9 g/dL (ref 6.5–8.1)

## 2020-06-21 LAB — TROPONIN I (HIGH SENSITIVITY): Troponin I (High Sensitivity): 3 ng/L (ref <18)

## 2020-06-21 NOTE — ED Provider Notes (Signed)
[facility] Provider Note: Lowella Dell, MD, FACEP  CSN: [id number] MRN: [id number] ARRIVAL: 06/20/20 at 2326 ROOM: MH10/MH10   CHIEF COMPLAINT  Palpitations   HISTORY OF PRESENT ILLNESS  06/21/20 2:09 AM Debbie [name] is a 50 y.o. female who states she has had palpitations for the past year after receiving the COVID-vaccine.  She describes the palpitations as a brief fluttering like sensation in her upper chest.  She is here now because they worsened in frequency over the past 3 days.  She is not sure of anything that makes them better or worse.  She also had some lightheadedness and shortness of breath earlier but no chest pain.   Past Medical History:  Diagnosis Date  . Anemia    history of  . Aneurysm of carotid artery (HCC)    hx/o - coiling 02/07/2010  . Chronic headache    since aneurysm diagnosis. mostly related to constipation as of 2017  . Coronary artery calcification   . Edema   . Familial hypercholesteremia    LDL over 300; lipoprotein testing 01/2009  . Family history of heart disease in female family member before age 38    LDL over 300 prior  . H/O cardiovascular stress test 12/08/05   fair exercise capacity, no ST segment changes suggestive of ischemia  . H/O echocardiogram 03/22/01   mild asymmetric LVH, mild aortic sclerosis, mild mitral regurgitation  . History of blood transfusion    x2 due to heavy bleeding and anemia from fibroids  . Hx of CT scan of chest 09/2016   Coronary calcium score of 15. This was 59 percentile for age and sex  . Hypertension   . Prediabetes   . S/P partial hysterectomy    due to uterine fibroids  . 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 Nigel Sloop)  . Vitamin D deficiency   . Wears glasses     Past Surgical History:  Procedure Laterality Date  . ABDOMINAL HYSTERECTOMY    . ANEURYSM COILING  02/07/10   stent assisted coiling of  right internal carotid artery aneurysm in periophthalmic region  . CESAREAN SECTION    . COLONOSCOPY  2011   Dr. Loreta Ave, anemia workup  . ESOPHAGOGASTRODUODENOSCOPY  2011   Dr. Loreta Ave  . HERNIA REPAIR     epigastric hernia repair age 74, right inguinal hernia as a child  . PARTIAL HYSTERECTOMY     abdominal due to fibroids, still has ovaries, Dr. Layne Benton  . TUBAL LIGATION    . tubal reversal  2008    Family History  Problem Relation Age of Onset  . Heart failure Sister   . Heart disease Sister 46       stents - LAD  . Hypertension Sister   . Aneurysm Sister   . Hypertension Mother   . Obesity Mother   . Cancer Mother        breast  . Heart disease Father 58       hx/o CABG w/ redo, stening, died of MI at age 36yo  . Diabetes Father   . Hyperlipidemia Father   . Heart failure Other   . Cancer Other   . Cancer Maternal Grandmother        breast  . Stroke Maternal Grandfather   . Cancer Paternal Grandfather        prostate cancer    Social History   Tobacco Use  .  Smoking status: Never Smoker  . Smokeless tobacco: Never Used  Vaping Use  . Vaping Use: Never used  Substance Use Topics  . Alcohol use: No  . Drug use: No    Prior to Admission medications   Medication Sig Start Date End Date Taking? Authorizing Provider  amLODipine (NORVASC) 5 MG tablet TAKE 1 TABLET (5 MG TOTAL) BY MOUTH IN THE MORNING AND AT BEDTIME. 02/27/20 05/27/20  Tolia, Sunit, DO  CRESTOR 20 MG tablet TAKE 1 TABLET BY MOUTH AT BEDTIME 05/03/20   Tysinger, Kermit Balo, PA-C  CVS ASPIRIN EC 325 MG EC tablet TAKE 1 TABLET BY MOUTH EVERY DAY 04/18/20   Tysinger, Kermit Balo, PA-C  ezetimibe (ZETIA) 10 MG tablet TAKE 1 TABLET BY MOUTH EVERY DAY 09/06/19   Tysinger, Kermit Balo, PA-C    Allergies Covid-19 (adenovirus) vaccine, Beta adrenergic blockers, Contrast media [iodinated diagnostic agents], Flexeril [cyclobenzaprine], Lipitor [atorvastatin calcium], Paxil [paroxetine hcl], and Sulfa antibiotics   REVIEW OF  SYSTEMS  Negative except as noted here or in the History of Present Illness.   PHYSICAL EXAMINATION  Initial Vital Signs Blood pressure 133/80, pulse 81, temperature 98.4 F (36.9 C), temperature source Oral, resp. rate 18, height 5\' 4"  (1.626 m), weight 70.3 kg, SpO2 97 %.  Examination General: Well-developed, well-nourished female in no acute distress; appearance consistent with age of record HENT: normocephalic; atraumatic Eyes: pupils equal, round and reactive to light; extraocular muscles intact Neck: supple Heart: regular rate and rhythm with occasional PACs Lungs: clear to auscultation bilaterally Abdomen: soft; nondistended; nontender; bowel sounds present Extremities: No deformity; full range of motion; pulses normal Neurologic: Awake, alert and oriented; motor function intact in all extremities and symmetric; no facial droop Skin: Warm and dry Psychiatric: Normal mood and affect   RESULTS  Summary of this visit's results, reviewed and interpreted by myself:   EKG Interpretation  Date/Time:  Wednesday June 20 2020 23:34:28 EDT Ventricular Rate:  95 PR Interval:  184 QRS Duration: 76 QT Interval:  324 QTC Calculation: 407 R Axis:   71 Text Interpretation: Normal sinus rhythm Normal ECG No significant change was found Confirmed by Jaslynne Dahan, 06-27-1987 (Jonny Ruiz) on 06/20/2020 11:45:36 PM      Laboratory Studies: Results for orders placed or performed during the hospital encounter of 06/21/20 (from the past 24 hour(s))  CBC with Differential     Status: None   Collection Time: 06/20/20 11:40 PM  Result Value Ref Range   WBC 7.9 4.0 - 10.5 K/uL   RBC 4.68 3.87 - 5.11 MIL/uL   Hemoglobin 13.4 12.0 - 15.0 g/dL   HCT 06/22/20 34.7 - 42.5 %   MCV 88.2 80.0 - 100.0 fL   MCH 28.6 26.0 - 34.0 pg   MCHC 32.4 30.0 - 36.0 g/dL   RDW 95.6 38.7 - 56.4 %   Platelets 335 150 - 400 K/uL   nRBC 0.0 0.0 - 0.2 %   Neutrophils Relative % 47 %   Neutro Abs 3.7 1.7 - 7.7 K/uL   Lymphocytes  Relative 42 %   Lymphs Abs 3.4 0.7 - 4.0 K/uL   Monocytes Relative 8 %   Monocytes Absolute 0.6 0.1 - 1.0 K/uL   Eosinophils Relative 2 %   Eosinophils Absolute 0.2 0.0 - 0.5 K/uL   Basophils Relative 1 %   Basophils Absolute 0.1 0.0 - 0.1 K/uL   Immature Granulocytes 0 %   Abs Immature Granulocytes 0.02 0.00 - 0.07 K/uL  Comprehensive metabolic panel  Status: Abnormal   Collection Time: 06/20/20 11:40 PM  Result Value Ref Range   Sodium 137 135 - 145 mmol/L   Potassium 3.6 3.5 - 5.1 mmol/L   Chloride 102 98 - 111 mmol/L   CO2 25 22 - 32 mmol/L   Glucose, Bld 128 (H) 70 - 99 mg/dL   BUN 11 6 - 20 mg/dL   Creatinine, Ser 6.75 0.44 - 1.00 mg/dL   Calcium 9.4 8.9 - 91.6 mg/dL   Total Protein 7.9 6.5 - 8.1 g/dL   Albumin 4.0 3.5 - 5.0 g/dL   AST 18 15 - 41 U/L   ALT 15 0 - 44 U/L   Alkaline Phosphatase 72 38 - 126 U/L   Total Bilirubin 0.5 0.3 - 1.2 mg/dL   GFR, Estimated >38 >46 mL/min   Anion gap 10 5 - 15  Troponin I (High Sensitivity)     Status: None   Collection Time: 06/20/20 11:40 PM  Result Value Ref Range   Troponin I (High Sensitivity) 3 <18 ng/L   Imaging Studies: DG Chest 2 View  Result Date: 06/20/2020 CLINICAL DATA:  Chest pain EXAM: CHEST - 2 VIEW COMPARISON:  August 14, 2019 FINDINGS: The heart size and mediastinal contours are within normal limits. Both lungs are clear. The visualized skeletal structures are unremarkable. IMPRESSION: No acute cardiopulmonary disease. Electronically Signed   By: Maudry Mayhew MD   On: 06/20/2020 23:53    ED COURSE and MDM  Nursing notes, initial and subsequent vitals signs, including pulse oximetry, reviewed and interpreted by myself.  Vitals:   06/20/20 2332 06/20/20 2336 06/21/20 0100 06/21/20 0200  BP:  (!) 149/118 133/80 130/86  Pulse:  91 81 71  Resp:  16 18 (!) 28  Temp:  98.4 F (36.9 C)    TempSrc:  Oral    SpO2:  100% 97% 97%  Weight: 70.3 kg     Height: 5\' 4"  (1.626 m)      Medications - No data to  display  Review of the patient's rhythm strip for the time she has been on the monitor reveals occasional premature atrial contractions.  No PVCs or sustained arrhythmias seen.  We will refer the patient to cardiology for further evaluation.  She was reassured that these are not in and of themselves dangerous but can be frustrating.  PROCEDURES  Procedures   ED DIAGNOSES     ICD-10-CM   1. Premature atrial contractions  I49.1        Yeriel Mineo, MD 06/21/20 [phone]

## 2020-06-21 NOTE — ED Notes (Signed)
ED Provider at bedside. 

## 2020-06-22 ENCOUNTER — Inpatient Hospital Stay: Payer: BC Managed Care – PPO

## 2020-06-22 ENCOUNTER — Ambulatory Visit: Payer: BC Managed Care – PPO | Admitting: Cardiology

## 2020-06-22 ENCOUNTER — Encounter: Payer: Self-pay | Admitting: Cardiology

## 2020-06-22 VITALS — BP 128/80 | HR 86 | Temp 97.8°F | Resp 16 | Ht 64.0 in | Wt 161.0 lb

## 2020-06-22 DIAGNOSIS — I340 Nonrheumatic mitral (valve) insufficiency: Secondary | ICD-10-CM

## 2020-06-22 DIAGNOSIS — I2584 Coronary atherosclerosis due to calcified coronary lesion: Secondary | ICD-10-CM

## 2020-06-22 DIAGNOSIS — R002 Palpitations: Secondary | ICD-10-CM

## 2020-06-22 DIAGNOSIS — Z8249 Family history of ischemic heart disease and other diseases of the circulatory system: Secondary | ICD-10-CM

## 2020-06-22 DIAGNOSIS — I2510 Atherosclerotic heart disease of native coronary artery without angina pectoris: Secondary | ICD-10-CM

## 2020-06-22 DIAGNOSIS — I10 Essential (primary) hypertension: Secondary | ICD-10-CM

## 2020-06-22 NOTE — Telephone Encounter (Signed)
P.A. approved til 06/07/21, called pharmacy went thru for $303 for 9 days.  Called pt and she will print & activate discount card before going to pharmacy

## 2020-06-22 NOTE — Progress Notes (Signed)
ID:  Debbie PoliceLeslie S Tumblin, DOB [birth year], MRN [id number]  PCP:  Jac Canavanysinger, David S, PA-C  Cardiologist:  Tessa LernerSunit Krishawn Vanderweele, DO, [facility] (established care 10/03/2019) Former Cardiology Providers: Dr. Bryan Lemmaavid Harding.   Date: 06/22/2020 Last Office Visit: 11/24/2019  Chief Complaint  Patient presents with  . Follow-up    Recent ER visit  . Palpitations    HPI  Debbie [name] is a 50 y.o. 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   Patient is referred to the office for management of hypertension and family history of premature coronary artery disease.    Patient states that her blood pressures are very well controlled on the current pharmacological therapy.  Patient recently had an ER visit for palpitation and now presents for follow-up.  The patient states that she was trying to focus on lifestyle changes and recently started diet 4 days ago and has started to experience palpitations.  Patient states that the palpitations were constant throughout the entire day on Tuesday and Wednesday due to the chronicity and her becoming symptomatic she went to the ER for further evaluation and management.  ER records reviewed as part of this office visit.  Patient states that she continues to have palpitations, but now are intermittent, can last for minutes to hours, no change with breathing techniques or bearing down.  She consumes decaf tea.  No coffee consumption or soda intake.  She also denies use of illicits, stimulant medications, weight loss supplements, or known thyroid disease.  Patient also denies near-syncope or syncopal events.  Patient does have a history of aneurysm coiling and stenting  back in 2011.  She was recommended to follow-up with their neuro interventional list or neurology for long-term follow-up; however, this is still pending.  FUNCTIONAL STATUS: Stationary bike every day for 5 mile in 20 minutes.  ALLERGIES: Allergies  Allergen Reactions  . Covid-19 (Adenovirus) Vaccine Other (See Comments)    High BP, pulses elevated, mild tongue swelling, mild dyspnea  . Beta Adrenergic Blockers     Spasm, inorgasmic, not willing to do beta blocker  . Contrast Media [Iodinated Diagnostic Agents]     For CT Angio, breaks out in hives  . Flexeril [Cyclobenzaprine]   . Lipitor [Atorvastatin Calcium] Other (See Comments)    Pt states she had a bad reaction several years ago when taking this  . Paxil [Paroxetine Hcl]     Tongue swelling had trouble swallowing   . Sulfa Antibiotics Other (See Comments)    Unknown reaction    MEDICATION LIST PRIOR TO VISIT: Current Meds  Medication Sig  . CRESTOR 20 MG tablet TAKE 1 TABLET BY MOUTH AT BEDTIME  . [facility] ASPIRIN EC 325 MG EC tablet TAKE 1 TABLET BY MOUTH EVERY DAY  . ezetimibe (ZETIA) 10 MG tablet TAKE 1 TABLET BY MOUTH EVERY DAY     PAST MEDICAL HISTORY: Past Medical History:  Diagnosis Date  . Anemia    history of  . Aneurysm of carotid artery (HCC)    hx/o - coiling 02/07/2010  . Chronic headache    since aneurysm diagnosis. mostly related to constipation as of 2017  . Coronary artery calcification   . Edema   . Familial hypercholesteremia  LDL over 300; lipoprotein testing 01/2009  . Family history of heart disease in female family member before age 22    LDL over 300 prior  . H/O cardiovascular stress test 12/08/05   fair exercise capacity, no ST segment changes suggestive of ischemia  . H/O echocardiogram 03/22/01   mild asymmetric LVH, mild aortic sclerosis, mild mitral regurgitation  . History of blood transfusion    x2 due to heavy bleeding and anemia from fibroids  . Hx of CT scan of chest 09/2016    Coronary calcium score of 15. This was 32 percentile for age and sex  . Hypertension   . Prediabetes   . S/P partial hysterectomy    due to uterine fibroids  . 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 Nigel Sloop)  . Vitamin D deficiency   . Wears glasses     PAST SURGICAL HISTORY: Past Surgical History:  Procedure Laterality Date  . ABDOMINAL HYSTERECTOMY    . ANEURYSM COILING  02/07/10   stent assisted coiling of right internal carotid artery aneurysm in periophthalmic region  . CESAREAN SECTION    . COLONOSCOPY  2011   Dr. Loreta Ave, anemia workup  . ESOPHAGOGASTRODUODENOSCOPY  2011   Dr. Loreta Ave  . HERNIA REPAIR     epigastric hernia repair age 50, right inguinal hernia as a child  . PARTIAL HYSTERECTOMY     abdominal due to fibroids, still has ovaries, Dr. Layne Benton  . TUBAL LIGATION    . tubal reversal  2008    FAMILY HISTORY: The patient family history includes Aneurysm in her sister; Cancer in her maternal grandmother, mother, paternal grandfather, and another family member; Diabetes in her father; Heart disease (age of onset: 46) in her sister; Heart disease (age of onset: 40) in her father; Heart failure in her sister and another family member; Hyperlipidemia in her father; Hypertension in her mother and sister; Obesity in her mother; Stroke in her maternal grandfather.  SOCIAL HISTORY:  The patient  reports that she has never smoked. She has never used smokeless tobacco. She reports that she does not drink alcohol and does not use drugs.  REVIEW OF SYSTEMS: Review of Systems  Constitutional: Negative for chills and fever.  HENT: Negative for hoarse voice and nosebleeds.   Eyes: Negative for discharge, double vision and pain.  Cardiovascular: Positive for palpitations. Negative for chest pain, claudication, dyspnea on exertion, leg swelling, near-syncope, orthopnea, paroxysmal nocturnal dyspnea and  syncope.  Respiratory: Negative for hemoptysis and shortness of breath.   Musculoskeletal: Negative for muscle cramps and myalgias.  Gastrointestinal: Negative for abdominal pain, constipation, diarrhea, hematemesis, hematochezia, melena, nausea and vomiting.  Neurological: Negative for dizziness.   PHYSICAL EXAM: Vitals with BMI 06/22/2020 06/22/2020 06/21/2020  Height - 5\' 4"  -  Weight - 161 lbs -  BMI - 27.62 -  Systolic 128 155  Diastolic 80 84 86  Pulse 86 97 71   CONSTITUTIONAL: Well-developed and well-nourished. No acute distress.  SKIN: Skin is warm and dry. No rash noted. No cyanosis. No pallor. No jaundice HEAD: Normocephalic and atraumatic.  EYES: No scleral icterus MOUTH/THROAT: Moist oral membranes.  NECK: No JVD present. No thyromegaly noted. No carotid bruits  LYMPHATIC: No visible cervical adenopathy.  CHEST Normal respiratory effort. No intercostal retractions  LUNGS: Clear to auscultation bilaterally. No stridor. No wheezes. No rales.  CARDIOVASCULAR: Regular rate and rhythm, positive S1-S2, soft holosystolic murmur heard at  the apex, no rubs or gallops appreciated. ABDOMINAL: No apparent ascites.  EXTREMITIES: No peripheral edema  HEMATOLOGIC: No significant bruising NEUROLOGIC: Oriented to person, place, and time. Nonfocal. Normal muscle tone.  PSYCHIATRIC: Normal mood and affect. Normal behavior. Cooperative  RADIOLOGY: IR angiogram carotid cerebral bilateral: February 12, 2010 approximately 3 mm x 2.2 mm wide neck right internal carotid artery periophthalmic region aneurysm.   CARDIAC DATABASE: EKG: 06/22/2020: Normal sinus rhythm, 90 bpm, normal axis, without underlying ischemia or injury pattern.   Echocardiogram: 10/18/2019: Left ventricle cavity is normal in size. Mild concentric hypertrophy of the left ventricle. Normal global wall motion. Normal LV systolic function with EF 65%. Normal diastolic filling pattern. Moderate (Grade II) mitral  regurgitation. Mild tricuspid regurgitation. No evidence of pulmonary hypertension.  Stress Testing: Exercise Sestamibi stress test 12/05/2019: Exercise nuclear stress test was performed using Bruce protocol. Patient reached 8.6 METS, and 99% of age predicted maximum heart rate. Exercise capacity was fair. Chest pain not reported. Heart rate and hemodynamic response were normal. Stress EKG revealed no ischemic changes. Normal myocardial perfusion. Stress LVEF 51%. Low risk study.   CT Cardiac Scoring: 10/03/2016: Coronary calcium score of 15. This was 27 percentile for age and sex matched control.  Heart Catheterization: None  LABORATORY DATA: CBC Latest Ref Rng & Units 06/20/2020 08/14/2019 05/20/2019  WBC 4.0 - 10.5 K/uL 7.9 9.3 7.5  Hemoglobin 12.0 - 15.0 g/dL 49.4 49.6 75.9  Hematocrit 36.0 - 46.0 % 41.3 44.0 39.4  Platelets 150 - 400 K/uL 335 336 372    CMP Latest Ref Rng & Units 06/20/2020 08/14/2019 05/20/2019  Glucose 70 - 99 mg/dL 163(W) 466(Z) 97  BUN 6 - 20 mg/dL 11 11 7   Creatinine 0.44 - 1.00 mg/dL 9.93 5.70  Sodium 135 - 145 mmol/L 137 141 140  Potassium 3.5 - 5.1 mmol/L 3.6 3.4(L) 4.2  Chloride 98 - 111 mmol/L 102 106 104  CO2 22 - 32 mmol/L 25 24 30(H)  Calcium 8.9 - 10.3 mg/dL 9.4 9.1 9.6  Total Protein 6.5 - 8.1 g/dL 7.9 - 7.3  Total Bilirubin 0.3 - 1.2 mg/dL 0.5 - 0.4  Alkaline Phos 38 - 126 U/L 72 - 79  AST 15 - 41 U/L 18 - 13  ALT 0 - 44 U/L 15 - 11    Lipid Panel  Lab Results  Component Value Date   CHOL 363 (H) 04/27/2019   HDL 46 04/27/2019   LDLCALC 304 (H) 04/27/2019   TRIG 85 04/27/2019   CHOLHDL 7.9 (H) 04/27/2019   BMP Recent Labs    08/14/19 2251 06/20/20 2340  NA 141 137  K 3.4* 3.6  CL 106 102  CO2 24 25  GLUCOSE 103* 128*  BUN 11 11  CREATININE 0.65 0.72  CALCIUM 9.1 9.4  GFRNONAA >60 >60  GFRAA >60  --     HEMOGLOBIN A1C Lab Results  Component Value Date   HGBA1C 6.1 (H) 06/26/2017   MPG 123 04/18/2016     IMPRESSION:    ICD-10-CM   1. Palpitations  R00.2 LONG TERM MONITOR (3-14 DAYS)    TSH  2. Essential hypertension  I10 EKG 12-Lead  3. Family history of premature CAD  Z82.49   4. Coronary atherosclerosis due to calcified coronary lesion of native artery  I25.10    I25.84   5. Nonrheumatic mitral valve regurgitation  I34.0 PCV ECHOCARDIOGRAM COMPLETE     RECOMMENDATIONS: Debbie [name] is a 50 y.o. 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   Palpitations: Improving, now intermittent. Recent ER visit-records reviewed. Most recent labs from 06/20/2020 independently reviewed No near-syncope or syncopal events. 14-day extended Holter monitor to evaluate for underlying dysrhythmias. Patient has undergone ischemic evaluation recently-no need for repeating at this time. We will check a TSH.  Family history of premature coronary artery disease: Recommended working on improving modifiable cardiovascular risk factors. Office blood pressures are at goal. Patient is asked to follow-up with her PCP for a fasting lipid profile and to uptitrate statin therapy if needed. Continue aspirin, statin therapy, Zetia.  Benign essential hypertension: Continue current antihypertensive medications. We also discussed nonpharmacological ways of improving high blood pressure.  We discussed low-salt diet, encouraged her to look into DASH diet or plant-based diet, and increasing physical activity as tolerated to 30 minutes a day 5 days a week.  Moderate mitral regurgitation: Continue to monitor.  Recommend 1 year follow-up echocardiogram to reevaluate progression.  Mixed dyslipidemia: Patient perfers to have it done w/ her PCP. Will await the results. Currently managed by primary care  provider.  FINAL MEDICATION LIST END OF ENCOUNTER: No orders of the defined types were placed in this encounter.    Current Outpatient Medications:  .  CRESTOR 20 MG tablet, TAKE 1 TABLET BY MOUTH AT BEDTIME, Disp: 90 tablet, Rfl: 0 .  [facility] ASPIRIN EC 325 MG EC tablet, TAKE 1 TABLET BY MOUTH EVERY DAY, Disp: 90 tablet, Rfl: 0 .  ezetimibe (ZETIA) 10 MG tablet, TAKE 1 TABLET BY MOUTH EVERY DAY, Disp: 90 tablet, Rfl: 3 .  amLODipine (NORVASC) 5 MG tablet, TAKE 1 TABLET (5 MG TOTAL) BY MOUTH IN THE MORNING AND AT BEDTIME., Disp: 180 tablet, Rfl: 0  Orders Placed This Encounter  Procedures  . TSH  . LONG TERM MONITOR (3-14 DAYS)  . EKG 12-Lead  . PCV ECHOCARDIOGRAM COMPLETE   --Continue cardiac medications as reconciled in final medication list. --Return in about 4 weeks (around 07/20/2020) for Follow up palpitation. Or sooner if needed. --Continue follow-up with your primary care physician regarding the management of your other chronic comorbid conditions.  Patient's questions and concerns were addressed to her satisfaction. She voices understanding of the instructions provided during this encounter.   This note was created using a voice recognition software as a result there may be grammatical errors inadvertently enclosed that do not reflect the nature of this encounter. Every attempt is made to correct such errors.  Tessa Lerner, [place], [facility]  Pager: [phone] Office: [phone]

## 2020-07-13 ENCOUNTER — Other Ambulatory Visit: Payer: Self-pay | Admitting: Cardiology

## 2020-07-13 DIAGNOSIS — I10 Essential (primary) hypertension: Secondary | ICD-10-CM

## 2020-07-16 NOTE — Telephone Encounter (Signed)
From pharmacy

## 2020-07-20 ENCOUNTER — Other Ambulatory Visit: Payer: Self-pay | Admitting: Medical

## 2020-07-28 LAB — TSH: TSH: 1.13 u[IU]/mL (ref 0.450–4.500)

## 2020-07-31 ENCOUNTER — Ambulatory Visit: Payer: BC Managed Care – PPO | Admitting: Cardiology

## 2020-08-13 ENCOUNTER — Ambulatory Visit: Payer: BC Managed Care – PPO | Admitting: Cardiology

## 2020-08-13 ENCOUNTER — Encounter: Payer: Self-pay | Admitting: Cardiology

## 2020-08-13 ENCOUNTER — Other Ambulatory Visit: Payer: Self-pay

## 2020-08-13 VITALS — BP 125/78 | HR 91 | Resp 16 | Ht 64.0 in | Wt 165.0 lb

## 2020-08-13 DIAGNOSIS — E782 Mixed hyperlipidemia: Secondary | ICD-10-CM

## 2020-08-13 DIAGNOSIS — I340 Nonrheumatic mitral (valve) insufficiency: Secondary | ICD-10-CM

## 2020-08-13 DIAGNOSIS — Z8249 Family history of ischemic heart disease and other diseases of the circulatory system: Secondary | ICD-10-CM

## 2020-08-13 DIAGNOSIS — I2584 Coronary atherosclerosis due to calcified coronary lesion: Secondary | ICD-10-CM

## 2020-08-13 DIAGNOSIS — I10 Essential (primary) hypertension: Secondary | ICD-10-CM

## 2020-08-13 DIAGNOSIS — R002 Palpitations: Secondary | ICD-10-CM

## 2020-08-13 DIAGNOSIS — Z712 Person consulting for explanation of examination or test findings: Secondary | ICD-10-CM

## 2020-08-13 NOTE — Progress Notes (Signed)
ID:  Debbie [name], DOB 1970-03-11, MRN [id number]  PCP:  Jac Canavan, PA-C  Cardiologist:  Tessa Lerner, DO, [facility] (established care 10/03/2019) Former Cardiology Providers: Dr. Bryan Lemma.   Date: 08/13/20 Last Office Visit: 06/22/2020  Chief Complaint  Patient presents with   Palpitations   Follow-up    HPI  Debbie [name] is a 50 y.o. 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   Patient is referred to the office for management of hypertension and family history of premature coronary artery disease.    Patient presents for follow-up after being evaluated in the ER for palpitations back in April 2022.  At the last office visit the shared decision was to proceed with a 14-day extended Holter monitor to evaluate for dysrhythmias.  Reviewed the results of the 14-day extended Holter monitor.  Overall normal sinus rhythm without any significant dysrhythmias.  She had 13 triggered events majority of during the first half of the monitoring period.  Triggered events noted normal sinus rhythm with ventricular ectopic beats.  Clinically patient states that she is doing well and has not had any reoccurrence of sustained palpitations.  No recent hospitalizations or urgent care visits.  No episodes of near syncope or syncope.  FUNCTIONAL STATUS: Stationary bike every day for 5 mile in 20 minutes.  ALLERGIES: Allergies  Allergen Reactions   Covid-19 (Adenovirus) Vaccine Other (See Comments)    High BP, pulses elevated, mild tongue swelling, mild dyspnea   Beta Adrenergic Blockers     Spasm, inorgasmic, not willing to do beta blocker   Contrast Media [Iodinated [employer]  [employer]]     For CT Angio, breaks out in hives   Flexeril [Cyclobenzaprine]    Lipitor [Atorvastatin Calcium] Other (See Comments)    Pt states she had a bad reaction several years ago when taking this   Paxil [Paroxetine Hcl]     Tongue swelling had trouble swallowing    Sulfa Antibiotics Other (See Comments)    Unknown reaction    MEDICATION LIST PRIOR TO VISIT: Current Meds  Medication Sig   amLODipine (NORVASC) 5 MG tablet TAKE 1 TABLET BY MOUTH IN THE MORNING AND IN THE EVENING   CRESTOR 20 MG tablet TAKE 1 TABLET BY MOUTH AT BEDTIME   [employer] ASPIRIN EC 325 MG EC tablet TAKE 1 TABLET BY MOUTH EVERY DAY   ezetimibe (ZETIA) 10 MG tablet TAKE 1 TABLET BY MOUTH EVERY DAY     PAST MEDICAL HISTORY: Past Medical History:  Diagnosis Date   Anemia    history of   Aneurysm of carotid artery (HCC)    hx/o - coiling 02/07/2010   Chronic headache    since aneurysm diagnosis. mostly related to constipation as of 2017   Coronary artery calcification    Edema    Familial hypercholesteremia    LDL over 300; lipoprotein testing 01/2009   Family history of heart disease in female family member before age 69    LDL over 300 prior   H/O cardiovascular stress test 12/08/05   fair exercise capacity, no ST segment changes suggestive of ischemia   H/O echocardiogram 03/22/01   mild asymmetric LVH, mild aortic sclerosis, mild mitral regurgitation   History of blood  transfusion    x2 due to heavy bleeding and anemia from fibroids   Hx of CT scan of chest 09/2016   Coronary calcium score of 15. This was 6694 percentile for age and sex   Hypertension    Prediabetes    S/P partial hysterectomy    due to uterine fibroids   Vertebral artery aneurysm (HCC)    left, unruptured, no surgery correction, monitoring since 2011 (followed by Dr. Nigel Sloopevaschwar)   Vitamin D deficiency    Wears glasses     PAST SURGICAL HISTORY: Past Surgical History:  Procedure Laterality Date   ABDOMINAL HYSTERECTOMY      ANEURYSM COILING  02/07/10   stent assisted coiling of right internal carotid artery aneurysm in periophthalmic region   CESAREAN SECTION     COLONOSCOPY  2011   Dr. Loreta AveMann, anemia workup   ESOPHAGOGASTRODUODENOSCOPY  2011   Dr. Loreta AveMann   HERNIA REPAIR     epigastric hernia repair age 50, right inguinal hernia as a child   PARTIAL HYSTERECTOMY     abdominal due to fibroids, still has ovaries, Dr. Layne BentonBrevard   TUBAL LIGATION     tubal reversal  2008    FAMILY HISTORY: The patient family history includes Aneurysm in her sister; Cancer in her maternal grandmother, mother, paternal grandfather, and another family member; Diabetes in her father; Heart disease (age of onset: 844) in her sister; Heart disease (age of onset: 2947) in her father; Heart failure in her sister and another family member; Hyperlipidemia in her father; Hypertension in her mother and sister; Obesity in her mother; Stroke in her maternal grandfather.  SOCIAL HISTORY:  The patient  reports that she has never smoked. She has never used smokeless tobacco. She reports that she does not drink alcohol and does not use drugs.  REVIEW OF SYSTEMS: Review of Systems  Constitutional: Negative for chills and fever.  HENT:  Negative for hoarse voice and nosebleeds.   Eyes:  Negative for discharge, double vision and pain.  Cardiovascular:  Positive for palpitations. Negative for chest pain, claudication, dyspnea on exertion, leg swelling, near-syncope, orthopnea, paroxysmal nocturnal dyspnea and syncope.  Respiratory:  Negative for hemoptysis and shortness of breath.   Musculoskeletal:  Negative for muscle cramps and myalgias.  Gastrointestinal:  Negative for abdominal pain, constipation, diarrhea, hematemesis, hematochezia, melena, nausea and vomiting.  Neurological:  Negative for dizziness.  PHYSICAL EXAM: Vitals with BMI 08/13/2020 06/22/2020 06/22/2020  Height 5\' 4"  - 5\' 4"   Weight 165 lbs - 161 lbs  BMI 28.31 - 27.62  Systolic 125 128  155  Diastolic 78 80 84  Pulse 91 86 97   CONSTITUTIONAL: Well-developed and well-nourished. No acute distress.  SKIN: Skin is warm and dry. No rash noted. No cyanosis. No pallor. No jaundice HEAD: Normocephalic and atraumatic.  EYES: No scleral icterus MOUTH/THROAT: Moist oral membranes.  NECK: No JVD present. No thyromegaly noted. No carotid bruits  LYMPHATIC: No visible cervical adenopathy.  CHEST Normal respiratory effort. No intercostal retractions  LUNGS: Clear to auscultation bilaterally. No stridor. No wheezes. No rales.  CARDIOVASCULAR: Regular rate and rhythm, positive S1-S2, soft holosystolic murmur heard at the apex, no rubs or gallops appreciated. ABDOMINAL: No apparent ascites.  EXTREMITIES: No peripheral edema  HEMATOLOGIC: No significant bruising NEUROLOGIC: Oriented to person, place, and time. Nonfocal. Normal muscle tone.  PSYCHIATRIC: Normal mood and affect. Normal behavior. 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   CARDIAC DATABASE: EKG: 06/22/2020: Normal sinus rhythm, 90 bpm, normal axis, without underlying ischemia or injury pattern.   Echocardiogram: 10/18/2019: Left ventricle cavity is normal in size. Mild concentric hypertrophy of the left ventricle. Normal global wall motion. Normal LV systolic function with EF 65%. Normal diastolic filling pattern. Moderate (Grade II) mitral regurgitation. Mild tricuspid regurgitation. No evidence of pulmonary hypertension.  Stress Testing: Exercise Sestamibi stress test 12/05/2019: Exercise nuclear stress test was performed using Bruce protocol. Patient reached 8.6 METS, and 99% of age predicted maximum heart rate. Exercise capacity was fair. Chest pain not reported. Heart rate and hemodynamic response were normal. Stress EKG revealed no ischemic changes. Normal myocardial  perfusion. Stress LVEF 51%. Low risk study.   CT Cardiac Scoring: 10/03/2016: Coronary calcium score of 15. This was 71 percentile for age and sex matched control.  Heart Catheterization: None  14 day extended Holter monitor: Dominant rhythm normal sinus rhythm. Heart rate 29-261 bpm.  Avg HR 83 bpm. No atrial fibrillation,high grade AV block, pauses (3 seconds or longer). 1 episode of NSVT, 6 beats in duration, 1.8 seconds, average heart rate 208 bpm. Total ventricular ectopic burden <1%. Total supraventricular ectopic burden <1% Patient triggered events: 13.  Underlying rhythm normal sinus with ventricular ectopy.  LABORATORY DATA: CBC Latest Ref Rng & Units 06/20/2020 08/14/2019 05/20/2019  WBC 4.0 - 10.5 K/uL 7.9 9.3 7.5  Hemoglobin 12.0 - 15.0 g/dL 26.9 48.5 46.2  Hematocrit 36.0 - 46.0 % 41.3 44.0 39.4  Platelets 150 - 400 K/uL 335 336 372    CMP Latest Ref Rng & Units 06/20/2020 08/14/2019 05/20/2019  Glucose 70 - 99 mg/dL 703(J) 009(F) 97  BUN 6 - 20 mg/dL 11 11 7   Creatinine 0.44 - 1.00 mg/dL 8.18 2.99  Sodium 135 - 145 mmol/L 137 141 140  Potassium 3.5 - 5.1 mmol/L 3.6 3.4(L) 4.2  Chloride 98 - 111 mmol/L 102 106 104  CO2 22 - 32 mmol/L 25 24 30(H)  Calcium 8.9 - 10.3 mg/dL 9.4 9.1 9.6  Total Protein 6.5 - 8.1 g/dL 7.9 - 7.3  Total Bilirubin 0.3 - 1.2 mg/dL 0.5 - 0.4  Alkaline Phos 38 - 126 U/L 72 - 79  AST 15 - 41 U/L 18 - 13  ALT 0 - 44 U/L 15 - 11    Lipid Panel  Lab Results  Component Value Date   CHOL 363 (H) 04/27/2019   HDL 46 04/27/2019   LDLCALC 304 (H) 04/27/2019   TRIG 85 04/27/2019   CHOLHDL 7.9 (H) 04/27/2019   BMP Recent Labs    08/14/19 2251 06/20/20 2340  NA 141 137  K 3.4* 3.6  CL 106 102  CO2 24 25  GLUCOSE 103* 128*  BUN 11 11  CREATININE 0.65 0.72  CALCIUM 9.1 9.4  GFRNONAA >60 >60  GFRAA >60  --     HEMOGLOBIN A1C Lab Results  Component Value Date   HGBA1C 6.1 (H) 06/26/2017   MPG 123 04/18/2016    IMPRESSION:     ICD-10-CM   1. Palpitations  R00.2     2. Essential hypertension  I10     3. Family history of premature CAD  Z82.49     4. Coronary atherosclerosis due to calcified coronary lesion of native artery  I25.10    I25.84     5. Nonrheumatic mitral valve regurgitation  I34.0     6. Mixed dyslipidemia  E78.2  7. Encounter to discuss test results  Z71.2        RECOMMENDATIONS: CAMRIN LAPRE is a 50 y.o. 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   Palpitations: Resolved. Independently reviewed labs from July 27, 2020. Reviewed the results of the 14-day extended Holter monitor with the patient. Patient would like to hold off on AV nodal blocking agents as her symptoms of palpitation have resolved. Patient had 1 episode of NSVT, 6 beats in duration as noted above.  She recently also had an ischemic evaluation as noted above. No additional diagnostic testing warranted at this time. Patient will call the office if her symptoms of palpitations resurface and at that time could consider short acting beta-blockers versus calcium channel blockers.  Family history of premature coronary artery disease: Recommended working on improving modifiable cardiovascular risk factors. Office blood pressures are at goal. Patient is informed to have a fasting lipid profile performed to reevaluate her lipids. Continue aspirin, statin therapy, Zetia. If cholesterol levels are still not well controlled may consider PCSK9 inhibitors.  Benign essential hypertension: Continue current antihypertensive medications. We also discussed nonpharmacological ways of improving high blood pressure.  We discussed low-salt diet, encouraged her to look into DASH diet or plant-based diet, and increasing  physical activity as tolerated to 30 minutes a day 5 days a week.  Moderate mitral regurgitation:  Continue to monitor.   Recommend 1 year follow-up echocardiogram to reevaluate progression.  Mixed dyslipidemia: Patient perfers to have it done w/ her PCP. Will await the results. Currently managed by primary care provider.  As long as the echocardiogram results remain relatively stable we will see the patient in 6 months or sooner if needed  FINAL MEDICATION LIST END OF ENCOUNTER: No orders of the defined types were placed in this encounter.    Current Outpatient Medications:    amLODipine (NORVASC) 5 MG tablet, TAKE 1 TABLET BY MOUTH IN THE MORNING AND IN THE EVENING, Disp: 180 tablet, Rfl: 0   CRESTOR 20 MG tablet, TAKE 1 TABLET BY MOUTH AT BEDTIME, Disp: 90 tablet, Rfl: 0   [employer] ASPIRIN EC 325 MG EC tablet, TAKE 1 TABLET BY MOUTH EVERY DAY, Disp: 90 tablet, Rfl: 0   ezetimibe (ZETIA) 10 MG tablet, TAKE 1 TABLET BY MOUTH EVERY DAY, Disp: 90 tablet, Rfl: 3  No orders of the defined types were placed in this encounter.  --Continue cardiac medications as reconciled in final medication list. --Return in about 6 months (around 02/12/2021) for Follow up, Palpitations, Review test results. Or sooner if needed. --Continue follow-up with your primary care physician regarding the management of your other chronic comorbid conditions.  Patient's questions and concerns were addressed to her satisfaction. She voices understanding of the instructions provided during this encounter.   This note was created using a voice recognition software as a result there may be grammatical errors inadvertently enclosed that do not reflect the nature of this encounter. Every attempt is made to correct such errors.  Tessa Lerner, [place], [facility]  Pager: [phone] Office: [phone]

## 2020-10-02 ENCOUNTER — Other Ambulatory Visit: Payer: Self-pay

## 2020-10-02 ENCOUNTER — Ambulatory Visit: Payer: BC Managed Care – PPO

## 2020-10-02 DIAGNOSIS — I340 Nonrheumatic mitral (valve) insufficiency: Secondary | ICD-10-CM

## 2020-10-03 ENCOUNTER — Encounter: Payer: Self-pay | Admitting: Internal Medicine

## 2020-10-05 ENCOUNTER — Encounter: Payer: Self-pay | Admitting: Internal Medicine

## 2020-10-12 ENCOUNTER — Other Ambulatory Visit: Payer: Self-pay | Admitting: Medical

## 2020-10-16 ENCOUNTER — Other Ambulatory Visit: Payer: Self-pay | Admitting: Cardiology

## 2020-10-16 ENCOUNTER — Other Ambulatory Visit: Payer: Self-pay | Admitting: Medical

## 2020-10-16 DIAGNOSIS — I10 Essential (primary) hypertension: Secondary | ICD-10-CM

## 2020-10-24 ENCOUNTER — Other Ambulatory Visit: Payer: Self-pay | Admitting: Medical

## 2020-10-25 NOTE — Telephone Encounter (Signed)
Pt has already been advised that she is overdue for cpe and needed to schedule. I will deny so she can call to schedule

## 2020-10-30 ENCOUNTER — Ambulatory Visit: Payer: BC Managed Care – PPO | Admitting: Medical

## 2020-10-30 ENCOUNTER — Other Ambulatory Visit: Payer: Self-pay

## 2020-10-30 VITALS — BP 122/80 | HR 79 | Temp 98.1°F | Wt 165.0 lb

## 2020-10-30 DIAGNOSIS — M65341 Trigger finger, right ring finger: Secondary | ICD-10-CM | POA: Diagnosis not present

## 2020-10-30 DIAGNOSIS — M5442 Lumbago with sciatica, left side: Secondary | ICD-10-CM

## 2020-10-30 DIAGNOSIS — M5432 Sciatica, left side: Secondary | ICD-10-CM | POA: Diagnosis not present

## 2020-10-30 MED ORDER — EZETIMIBE 10 MG PO TABS: 10.0000 mg | ORAL_TABLET | Freq: Every day | ORAL | 0 refills | Status: DC

## 2020-10-30 MED ORDER — ASPIRIN 325 MG PO TBEC: 325.0000 mg | DELAYED_RELEASE_TABLET | Freq: Every day | ORAL | 0 refills | Status: DC

## 2020-10-30 MED ORDER — TRAMADOL HCL 50 MG PO TABS: 50.0000 mg | ORAL_TABLET | Freq: Three times a day (TID) | ORAL | 0 refills | Status: AC | PRN

## 2020-10-30 MED ORDER — PREDNISONE 10 MG PO TABS: ORAL_TABLET | ORAL | 0 refills | Status: DC

## 2020-10-30 NOTE — Progress Notes (Signed)
Called pt no answer, could not leave a vm.

## 2020-10-30 NOTE — Progress Notes (Signed)
Subjective:  Debbie [name] is a 50 y.o. female who presents for Chief Complaint  Patient presents with   Hip Pain    Hip pain. Last Thursday and Friday was severe that she couldn't move much and had to have help getting up. Laying down gets worse. Feels like its more of a spasm as deep as bone- radiates down left side      Here for low back/hip pain.  Has had flareups occasionally but this past week had a severe flareup.  She hurt in the buttock/mid pelvis.  The pain was so bad she had to have help getting out of the bed, can barely put weight on that side on the left.  No recent injury trauma or heavy lifting. When lying flat, pain was worse.   Felt spasm at the bone.  She notes that pain radiates down left buttock but also left thigh.  No numbness or tingling in her legs.  No weakness.  She is doing better today.  Aleve did not help.  She also notes right trigger finger.  She is right-handed.  Her right ring finger has been getting stuck for the last several months.  No injury or trauma.  No swelling.  She comes in soon for a physical.  She would like to have labs prior to the visit since the afternoon visit we required to be fasting all day.  No other aggravating or relieving factors.    No other c/o.  Past Medical History:  Diagnosis Date   Anemia    history of   Aneurysm of carotid artery (HCC)    hx/o - coiling 02/07/2010   Chronic headache    since aneurysm diagnosis. mostly related to constipation as of 2017   Coronary artery calcification    Edema    Familial hypercholesteremia    LDL over 300; lipoprotein testing 01/2009   Family history of heart disease in female family member before age 7    LDL over 300 prior   H/O cardiovascular stress test 12/08/05   fair exercise capacity, no ST segment changes suggestive of ischemia   H/O echocardiogram 03/22/01   mild asymmetric LVH, mild aortic sclerosis, mild mitral regurgitation   History of blood transfusion    x2 due to  heavy bleeding and anemia from fibroids   Hx of CT scan of chest 09/2016   Coronary calcium score of 15. This was 24 percentile for age and sex   Hypertension    Prediabetes    S/P partial hysterectomy    due to uterine fibroids   Vertebral artery aneurysm (HCC)    left, unruptured, no surgery correction, monitoring since 2011 (followed by Dr. Nigel Sloop)   Vitamin D deficiency    Wears glasses    Current Outpatient Medications on File Prior to Visit  Medication Sig Dispense Refill   amLODipine (NORVASC) 5 MG tablet TAKE 1 TABLET BY MOUTH IN THE MORNING AND IN THE EVENING 180 tablet 0   CRESTOR 20 MG tablet TAKE 1 TABLET BY MOUTH AT BEDTIME 90 tablet 0   No current facility-administered medications on file prior to visit.    The following portions of the patient's history were reviewed and updated as appropriate: allergies, current medications, past family history, past medical history, past social history, past surgical history and problem list.  ROS Otherwise as in subjective above  Objective: BP 122/80   Pulse 79   Temp 98.1 F (36.7 C)   Wt 165 lb (74.8 kg)  BMI 28.32 kg/m   General appearance: alert, no distress, well developed, well nourished Back: Tender in left sacroiliac region almost to midline, lower sacroiliac region.  Mild pain with flexion extension which is about 90% of normal.  Otherwise back nontender.  No deformity or swelling. Tenderness over the right ring finger at the volar base and at the MCP of the same finger.  No swelling or deformity otherwise.  There is a trigger finger noted for the right ring finger Neuro: Negative straight leg raise, DTRs blunted of the left lower leg both patellar and Achilles reflex, otherwise leg strength is normal, sensation normal.  Hands and fingers neurovascularly intact Pulses: 2+ radial pulses, 2+ pedal pulses, normal cap refill Ext: no edema   Assessment: Encounter Diagnoses  Name Primary?   Sciatica of left side  Yes   Left-sided low back pain with left-sided sciatica, unspecified chronicity    Trigger finger, right ring finger      Plan: Sciatica, left low back pain-for the next few days continue stretching and range of motion activity as well as Aleve twice daily.  We will go ahead and make referral to physical therapy for preventative and treatment measures.  I placed orders in the computer for x-rays in the event of a flareup again.  We discussed not necessarily need to get x-rays at this time.  Trigger finger-referral to orthopedics  Follow-up as planned next month for physical.  Jaela was seen today for hip pain.  Diagnoses and all orders for this visit:  Sciatica of left side -     Ambulatory referral to Physical Therapy -     DG Lumbar Spine Complete; Future -     DG Pelvis Comp Min 3V; Future  Left-sided low back pain with left-sided sciatica, unspecified chronicity -     Ambulatory referral to Physical Therapy -     DG Lumbar Spine Complete; Future -     DG Pelvis Comp Min 3V; Future  Trigger finger, right ring finger -     AMB referral to orthopedics  Other orders -     predniSONE (DELTASONE) 10 MG tablet; 6/5/4/3/2/1 taper -     traMADol (ULTRAM) 50 MG tablet; Take 1 tablet (50 mg total) by mouth every 8 (eight) hours as needed for up to 5 days. -     ezetimibe (ZETIA) 10 MG tablet; Take 1 tablet (10 mg total) by mouth daily. -     aspirin ([employer] ASPIRIN EC) 325 MG EC tablet; Take 1 tablet (325 mg total) by mouth daily.    Follow up: pending referrals

## 2020-11-06 NOTE — Progress Notes (Signed)
Called and spoke with patient regarding her echocardiogram results.

## 2020-11-14 DIAGNOSIS — M6530 Trigger finger, unspecified finger: Secondary | ICD-10-CM | POA: Insufficient documentation

## 2020-12-05 ENCOUNTER — Encounter: Payer: Self-pay | Admitting: Medical

## 2020-12-05 ENCOUNTER — Ambulatory Visit (INDEPENDENT_AMBULATORY_CARE_PROVIDER_SITE_OTHER): Payer: BC Managed Care – PPO | Admitting: Medical

## 2020-12-05 ENCOUNTER — Other Ambulatory Visit: Payer: Self-pay

## 2020-12-05 VITALS — BP 130/80 | HR 90 | Ht 64.0 in | Wt 165.4 lb

## 2020-12-05 DIAGNOSIS — Z90710 Acquired absence of both cervix and uterus: Secondary | ICD-10-CM

## 2020-12-05 DIAGNOSIS — Z803 Family history of malignant neoplasm of breast: Secondary | ICD-10-CM

## 2020-12-05 DIAGNOSIS — R7301 Impaired fasting glucose: Secondary | ICD-10-CM

## 2020-12-05 DIAGNOSIS — Z8679 Personal history of other diseases of the circulatory system: Secondary | ICD-10-CM

## 2020-12-05 DIAGNOSIS — I10 Essential (primary) hypertension: Secondary | ICD-10-CM

## 2020-12-05 DIAGNOSIS — Z8249 Family history of ischemic heart disease and other diseases of the circulatory system: Secondary | ICD-10-CM

## 2020-12-05 DIAGNOSIS — E559 Vitamin D deficiency, unspecified: Secondary | ICD-10-CM | POA: Diagnosis not present

## 2020-12-05 DIAGNOSIS — Z1211 Encounter for screening for malignant neoplasm of colon: Secondary | ICD-10-CM

## 2020-12-05 DIAGNOSIS — K5900 Constipation, unspecified: Secondary | ICD-10-CM

## 2020-12-05 DIAGNOSIS — Z1231 Encounter for screening mammogram for malignant neoplasm of breast: Secondary | ICD-10-CM

## 2020-12-05 DIAGNOSIS — E78019 Familial hypercholesterolemia, unspecified: Secondary | ICD-10-CM

## 2020-12-05 DIAGNOSIS — Z124 Encounter for screening for malignant neoplasm of cervix: Secondary | ICD-10-CM

## 2020-12-05 DIAGNOSIS — Z0000 Encounter for general adult medical examination without abnormal findings: Secondary | ICD-10-CM

## 2020-12-05 DIAGNOSIS — E782 Mixed hyperlipidemia: Secondary | ICD-10-CM

## 2020-12-05 DIAGNOSIS — E7801 Familial hypercholesterolemia: Secondary | ICD-10-CM

## 2020-12-05 DIAGNOSIS — Z9889 Other specified postprocedural states: Secondary | ICD-10-CM

## 2020-12-05 DIAGNOSIS — Z833 Family history of diabetes mellitus: Secondary | ICD-10-CM

## 2020-12-05 NOTE — Progress Notes (Signed)
Subjective:   HPI  Debbie [name] is a 50 y.o. female who presents for Chief Complaint  Patient presents with   cpe    Cpe. Had labs done this morning. Declines flu shot. No concerns    Patient Care Team: Milda Lindvall, Kermit Balo, PA-C as PCP - General (Family Medicine) Sees dentist Sees eye doctor Dr. Tessa Lerner, cardiology   Concerns: No particular concerns  Last visit she had some back pain, sciatica.   Went to physical therapy for a few sessions.   Pain resolved  HTN - compliant with medication.  BPs at home 117-120 SBP, DBP 75-80  Hyperlipidemia - compliant with medication  Reviewed their medical, surgical, family, social, medication, and allergy history and updated chart as appropriate.  Past Medical History:  Diagnosis Date   Anemia    history of   Aneurysm of carotid artery (HCC)    hx/o - coiling 02/07/2010   Chronic headache    since aneurysm diagnosis. mostly related to constipation as of 2017   Coronary artery calcification    Edema    Familial hypercholesteremia    LDL over 300; lipoprotein testing 01/2009   Family history of heart disease in female family member before age 65    LDL over 300 prior   H/O cardiovascular stress test 12/08/05   fair exercise capacity, no ST segment changes suggestive of ischemia   H/O echocardiogram 03/22/01   mild asymmetric LVH, mild aortic sclerosis, mild mitral regurgitation   History of blood transfusion    x2 due to heavy bleeding and anemia from fibroids   Hx of CT scan of chest 09/2016   Coronary calcium score of 15. 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 Nigel Sloop)   Vitamin D deficiency    Wears glasses     Family History  Problem Relation Age of Onset   Heart failure Sister    Heart disease Sister 6        stents - LAD   Hypertension Sister    Aneurysm Sister    Hypertension Mother    Obesity Mother    Cancer Mother        breast   Heart disease Father 45       hx/o CABG w/ redo, stening, died of MI at age 69yo   Diabetes Father    Hyperlipidemia Father    Heart failure Other    Cancer Other    Cancer Maternal Grandmother        breast   Stroke Maternal Grandfather    Cancer Paternal Grandfather        prostate cancer     Current Outpatient Medications:    amLODipine (NORVASC) 5 MG tablet, TAKE 1 TABLET BY MOUTH IN THE MORNING AND IN THE EVENING, Disp: 180 tablet, Rfl: 0   aspirin ([employer] ASPIRIN EC) 325 MG EC tablet, Take 1 tablet (325 mg total) by mouth daily., Disp: 90 tablet, Rfl: 0   CRESTOR 20 MG tablet, TAKE 1 TABLET BY MOUTH AT BEDTIME, Disp: 90 tablet, Rfl: 0   ezetimibe (ZETIA) 10 MG tablet, Take 1 tablet (10 mg total) by mouth daily., Disp: 90 tablet, Rfl: 0  Allergies  Allergen Reactions   Covid-19 (Adenovirus) Vaccine Other (See Comments)  High BP, pulses elevated, mild tongue swelling, mild dyspnea   Beta Adrenergic Blockers     Spasm, inorgasmic, not willing to do beta blocker   Contrast Media [Iodinated Diagnostic Agents]     For CT Angio, breaks out in hives   Flexeril [Cyclobenzaprine]    Lipitor [Atorvastatin Calcium] Other (See Comments)    Pt states she had a bad reaction several years ago when taking this   Paxil [Paroxetine Hcl]     Tongue swelling had trouble swallowing    Sulfa Antibiotics Other (See Comments)    Unknown reaction     Review of Systems Constitutional: -fever, -chills, -sweats, -unexpected weight change, -decreased appetite, -fatigue Allergy: -sneezing, -itching, -congestion Dermatology: -changing moles, --rash, -lumps ENT: -runny nose, -ear pain, -sore throat, -hoarseness, -sinus pain, -teeth pain, - ringing in ears, -hearing loss, -nosebleeds Cardiology: -chest pain, -palpitations, -swelling, -difficulty breathing when lying  flat, -waking up short of breath Respiratory: -cough, -shortness of breath, -difficulty breathing with exercise or exertion, -wheezing, -coughing up blood Gastroenterology: -abdominal pain, -nausea, -vomiting, -diarrhea, -constipation, -blood in stool, -changes in bowel movement, -difficulty swallowing or eating Hematology: -bleeding, -bruising  Musculoskeletal: -joint aches, -muscle aches, -joint swelling, -back pain, -neck pain, -cramping, -changes in gait Ophthalmology: denies vision changes, eye redness, itching, discharge Urology: -burning with urination, -difficulty urinating, -blood in urine, -urinary frequency, -urgency, -incontinence Neurology: -headache, -weakness, -tingling, -numbness, -memory loss, -falls, -dizziness Psychology: -depressed mood, -agitation, -sleep problems Breast/gyn: -breast tendnerss, -discharge, -lumps, -vaginal discharge,- irregular periods, -heavy periods   Depression screen [facility] 2/9 12/05/2020 10/30/2020 12/10/2017 06/26/2017  Decreased Interest 0 0 3 0  Down, Depressed, Hopeless 0 0 3 0  PHQ - 2 Score 0 0 6 0  Altered sleeping - - 3 0  Tired, decreased energy - - 3 0  Change in appetite - - 1 0  Feeling bad or failure about yourself  - - 1 0  Trouble concentrating - - 3 0  Moving slowly or fidgety/restless - - 3 0  Suicidal thoughts - - 0 0  PHQ-9 Score - - 20 0  Difficult doing work/chores - - Somewhat difficult Not difficult at all       Objective:  BP 130/80   Pulse 90   Ht 5\' 4"  (1.626 m)   Wt 165 lb 6.4 oz (75 kg)   BMI 28.39 kg/m   General appearance: alert, no distress, WD/WN, African American female Skin: unremarkable HEENT: normocephalic, conjunctiva/corneas normal, sclerae anicteric, PERRLA, EOMi, nares patent, no discharge or erythema, pharynx normal Oral cavity: MMM, tongue normal, teeth normal Neck: supple, no lymphadenopathy, no thyromegaly, no masses, normal ROM, no bruits Chest: non tender, normal shape and expansion Heart: RRR,  normal S1, S2, no murmurs Lungs: CTA bilaterally, no wheezes, rhonchi, or rales Abdomen: +bs, soft, non tender, non distended, no masses, no hepatomegaly, no splenomegaly, no bruits Back: non tender, normal ROM, no scoliosis Musculoskeletal: upper extremities non tender, no obvious deformity, normal ROM throughout, lower extremities non tender, no obvious deformity, normal ROM throughout Extremities: no edema, no cyanosis, no clubbing Pulses: 2+ symmetric, upper and lower extremities, normal cap refill Neurological: alert, oriented x 3, CN2-12 intact, strength normal upper extremities and lower extremities, sensation normal throughout, DTRs 2+ throughout, no cerebellar signs, gait normal Psychiatric: normal affect, behavior normal, pleasant  Breast/gyn/rectal - declines    Assessment and Plan :   Encounter Diagnoses  Name Primary?   Encounter for health maintenance examination in adult Yes   Vitamin D  deficiency    Mixed dyslipidemia    Impaired fasting blood sugar    Constipation, unspecified constipation type    Essential hypertension, benign    Familial hypercholesterolemia    Family history of aneurysm    Family history of breast cancer    Family history of diabetes mellitus    Family history of heart disease in female family member before age 18    History of aneurysm    History of cerebral aneurysm repair    Screen for colon cancer    Screening for cervical cancer    Encounter for screening mammogram for malignant neoplasm of breast    S/P hysterectomy      This visit was a preventative care visit, also known as wellness visit or routine physical.   Topics typically include healthy lifestyle, diet, exercise, preventative care, vaccinations, sick and well care, proper use of emergency dept and after hours care, as well as other concerns.     Recommendations: Continue to return yearly for your annual wellness and preventative care visits.  This gives [place] a chance to discuss  healthy lifestyle, exercise, vaccinations, review your chart record, and perform screenings where appropriate.  I recommend you see your eye doctor yearly for routine vision care.  I recommend you see your dentist yearly for routine dental care including hygiene visits twice yearly.   Vaccination recommendations were reviewed Immunization History  Administered Date(s) Administered   Influenza,inj,Quad PF,6+ Mos 10/28/2012   PFIZER(Purple Top)SARS-COV-2 Vaccination 04/23/2019   Tdap 02/22/2009   Declines flu shot, covid and tetanus boosters  She had severe reaction with covid vaccine   Screening for cancer: Colon cancer screening: We will refer you for Cologard   Breast cancer screening: You should perform a self breast exam monthly.   We reviewed recommendations for regular mammograms and breast cancer screening.  Cervical cancer screening: We reviewed recommendations for pap smear screening. S/p hysterectomy   Skin cancer screening: Check your skin regularly for new changes, growing lesions, or other lesions of concern Come in for evaluation if you have skin lesions of concern.  Lung cancer screening: If you have a greater than 20 pack year history of tobacco use, then you may qualify for lung cancer screening with a chest CT scan.   Please call your insurance company to inquire about coverage for this test.  We currently don't have screenings for other cancers besides breast, cervical, colon, and lung cancers.  If you have a strong family history of cancer or have other cancer screening concerns, please let me know.    Bone health: Get at least 150 minutes of aerobic exercise weekly Get weight bearing exercise at least once weekly Bone density test:  A bone density test is an imaging test that uses a type of X-ray to measure the amount of calcium and other minerals in your bones. The test may be used to diagnose or screen you for a condition that causes weak or thin  bones (osteoporosis), predict your risk for a broken bone (fracture), or determine how well your osteoporosis treatment is working. The bone density test is recommended for females 65 and older, or females or males <65 if certain risk factors such as thyroid disease, long term use of steroids such as for asthma or rheumatological issues, vitamin D deficiency, estrogen deficiency, family history of osteoporosis, self or family history of fragility fracture in first degree relative.    Heart health: Get at least 150 minutes of aerobic exercise weekly  Limit alcohol It is important to maintain a healthy blood pressure and healthy cholesterol numbers  Heart disease screening: Screening for heart disease includes screening for blood pressure, fasting lipids, glucose/diabetes screening, BMI height to weight ratio, reviewed of smoking status, physical activity, and diet.    Goals include blood pressure 120/80 or less, maintaining a healthy lipid/cholesterol profile, preventing diabetes or keeping diabetes numbers under good control, not smoking or using tobacco products, exercising most days per week or at least 150 minutes per week of exercise, and eating healthy variety of fruits and vegetables, healthy oils, and avoiding unhealthy food choices like fried food, fast food, high sugar and high cholesterol foods.    Other tests may possibly include EKG test, CT coronary calcium score, echocardiogram, exercise treadmill stress test.     Medical care options: I recommend you continue to seek care here first for routine care.  We try really hard to have available appointments Monday through Friday daytime hours for sick visits, acute visits, and physicals.  Urgent care should be used for after hours and weekends for significant issues that cannot wait till the next day.  The emergency department should be used for significant potentially life-threatening emergencies.  The emergency department is expensive,  can often have long wait times for less significant concerns, so try to utilize primary care, urgent care, or telemedicine when possible to avoid unnecessary trips to the emergency department.  Virtual visits and telemedicine have been introduced since the pandemic started in 2020, and can be convenient ways to receive medical care.  We offer virtual appointments as well to assist you in a variety of options to seek medical care.    Separate significant issues discussed: HTN - compliant with medicaiton  Hyperlipidemia - compliant with medication  Vit D deficiency - not on medication currently.  Labs today    Anniah was seen today for cpe.  Diagnoses and all orders for this visit:  Encounter for health maintenance examination in adult -     Lipid panel -     CBC -     Comprehensive metabolic panel -     VITAMIN D 25 Hydroxy (Vit-D Deficiency, Fractures) -     Hemoglobin A1c -     MM DIGITAL SCREENING BILATERAL; Future -     Cologuard  Vitamin D deficiency -     VITAMIN D 25 Hydroxy (Vit-D Deficiency, Fractures)  Mixed dyslipidemia -     Lipid panel  Impaired fasting blood sugar -     Hemoglobin A1c  Constipation, unspecified constipation type  Essential hypertension, benign -     Comprehensive metabolic panel  Familial hypercholesterolemia -     Lipid panel  Family history of aneurysm  Family history of breast cancer  Family history of diabetes mellitus  Family history of heart disease in female family member before age 60  History of aneurysm  History of cerebral aneurysm repair  Screen for colon cancer -     Cologuard  Screening for cervical cancer  Encounter for screening mammogram for malignant neoplasm of breast -     MM DIGITAL SCREENING BILATERAL; Future  S/P hysterectomy  Follow-up pending labs, yearly for physical

## 2020-12-06 LAB — CBC
Hematocrit: 41.2 % (ref 34.0–46.6)
Hemoglobin: 13.6 g/dL (ref 11.1–15.9)
MCH: 28.6 pg (ref 26.6–33.0)
MCHC: 33 g/dL (ref 31.5–35.7)
MCV: 87 fL (ref 79–97)
Platelets: 347 10*3/uL (ref 150–450)
RBC: 4.75 x10E6/uL (ref 3.77–5.28)
RDW: 13.1 % (ref 11.7–15.4)
WBC: 7.4 10*3/uL (ref 3.4–10.8)

## 2020-12-06 LAB — COMPREHENSIVE METABOLIC PANEL
ALT: 19 IU/L (ref 0–32)
AST: 18 IU/L (ref 0–40)
Albumin/Globulin Ratio: 1.5 (ref 1.2–2.2)
Albumin: 4.5 g/dL (ref 3.8–4.9)
Alkaline Phosphatase: 96 IU/L (ref 44–121)
BUN/Creatinine Ratio: 11 (ref 9–23)
BUN: 10 mg/dL (ref 6–24)
Bilirubin Total: 0.4 mg/dL (ref 0.0–1.2)
CO2: 25 mmol/L (ref 20–29)
Calcium: 9.9 mg/dL (ref 8.7–10.2)
Chloride: 106 mmol/L (ref 96–106)
Creatinine, Ser: 0.89 mg/dL (ref 0.57–1.00)
Globulin, Total: 3 g/dL (ref 1.5–4.5)
Glucose: 101 mg/dL — ABNORMAL HIGH (ref 70–99)
Potassium: 4.5 mmol/L (ref 3.5–5.2)
Sodium: 145 mmol/L — ABNORMAL HIGH (ref 134–144)
Total Protein: 7.5 g/dL (ref 6.0–8.5)
eGFR: 78 mL/min/{1.73_m2} (ref >59)

## 2020-12-06 LAB — HEMOGLOBIN A1C
Est. average glucose Bld gHb Est-mCnc: 143 mg/dL
Hgb A1c MFr Bld: 6.6 % — ABNORMAL HIGH (ref 4.8–5.6)

## 2020-12-06 LAB — LIPID PANEL
Chol/HDL Ratio: 4.8 ratio — ABNORMAL HIGH (ref 0.0–4.4)
Cholesterol, Total: 202 mg/dL — ABNORMAL HIGH (ref 100–199)
HDL: 42 mg/dL (ref >39)
LDL Chol Calc (NIH): 146 mg/dL — ABNORMAL HIGH (ref 0–99)
Triglycerides: 75 mg/dL (ref 0–149)
VLDL Cholesterol Cal: 14 mg/dL (ref 5–40)

## 2020-12-06 LAB — VITAMIN D 25 HYDROXY (VIT D DEFICIENCY, FRACTURES): Vit D, 25-Hydroxy: 12.9 ng/mL — ABNORMAL LOW (ref 30.0–100.0)

## 2020-12-20 ENCOUNTER — Ambulatory Visit
Admission: RE | Admit: 2020-12-20 | Discharge: 2020-12-20 | Disposition: A | Discharge status: Home / Self Care | Payer: BC Managed Care – PPO | Source: Ambulatory Visit | Attending: Medical | Admitting: Medical

## 2020-12-20 ENCOUNTER — Other Ambulatory Visit: Payer: Self-pay

## 2020-12-20 DIAGNOSIS — Z0000 Encounter for general adult medical examination without abnormal findings: Secondary | ICD-10-CM

## 2020-12-20 DIAGNOSIS — Z1231 Encounter for screening mammogram for malignant neoplasm of breast: Secondary | ICD-10-CM

## 2021-01-07 ENCOUNTER — Ambulatory Visit: Payer: BC Managed Care – PPO | Admitting: Cardiology

## 2021-01-18 ENCOUNTER — Other Ambulatory Visit: Payer: Self-pay | Admitting: Medical

## 2021-01-19 ENCOUNTER — Other Ambulatory Visit: Payer: Self-pay | Admitting: Cardiology

## 2021-01-19 DIAGNOSIS — I10 Essential (primary) hypertension: Secondary | ICD-10-CM

## 2021-01-28 ENCOUNTER — Other Ambulatory Visit: Payer: Self-pay | Admitting: Medical

## 2021-02-28 ENCOUNTER — Other Ambulatory Visit: Payer: Self-pay

## 2021-02-28 ENCOUNTER — Ambulatory Visit: Payer: BC Managed Care – PPO | Admitting: Family Medicine

## 2021-02-28 ENCOUNTER — Encounter: Payer: Self-pay | Admitting: Family Medicine

## 2021-02-28 VITALS — BP 118/72 | HR 92 | Ht 64.0 in | Wt 164.6 lb

## 2021-02-28 DIAGNOSIS — R0602 Shortness of breath: Secondary | ICD-10-CM

## 2021-02-28 DIAGNOSIS — E559 Vitamin D deficiency, unspecified: Secondary | ICD-10-CM | POA: Diagnosis not present

## 2021-02-28 DIAGNOSIS — R079 Chest pain, unspecified: Secondary | ICD-10-CM

## 2021-02-28 DIAGNOSIS — R7301 Impaired fasting glucose: Secondary | ICD-10-CM

## 2021-02-28 DIAGNOSIS — E7801 Familial hypercholesterolemia: Secondary | ICD-10-CM

## 2021-02-28 NOTE — Patient Instructions (Addendum)
Start taking vitamin D3 5000 IU daily (over-the-counter).  Ultimately you will need your level rechecked (3-4 months).  Usually we have people stay on 1000 IU for long-term maintenance of vitamin D levels, but since yours was so low, I'd prefer you to start out at a higher dose (because I don't know the size of the prescription D capsule).  Start taking Prilosec OTC once daily (take it in the morning before breakfast). Take this for the next 2 weeks. This will help protect the stomach from the aspirin--I think you may have irritation of the stomach and the esophagus, and the Prilosec will heal this. Once you are feeling better, you likely can switch to Pepcid (famotidine) in place of longterm prilosec.  I will let you discuss this further with Vincenza Hews at your follow-up visit.  You should see him later this month (2 week follow-up), and be fasting for that visit, and 3 month follow-up labs are due.

## 2021-02-28 NOTE — Progress Notes (Addendum)
Chief Complaint  Patient presents with   Consult    Chest heaviness, fatigue, SOB intermittently. Has not exercised in a while. Some lightheadness that coincides with her chest heaviness. She explains it like she feels like if she could "burp" she would feel better. Feels like she is breathing through a mask even when she doesn't have one on. Feels like she can't get a full breath or a full yawn-was anemic years ago and had same issue.    Full feeling in her chest for about a week or more. Feels heavy (not a crushing sensation), feels like she has to belch.  She drank soda, but couldn't belch, so it made it worse.  Feels like she is wearing a mask (when she isn't)--like she can't get enough air. There is occasional lightheadedness, dizzy.  Vision seems a little off during these episodes as well.   Feels a little better immediately after she eats, but is short-lived. Last night ate a burger, didn't feel any better, didn't feel worse.  Denies any burning in her chest, hasn't had any regurgitation of acid.  Has been on vacation, so hasn't been walking daily, or eating on schedule, not drinking as much water.  So routine was completely off the last couple of weeks. Didn't feel well enough to go back to work today.  PMH, PSH, SH reviewed.  Pre-DM/DM (last A1c was 6.6 in October)--she cut back on sugar in her tea (went from 6 packs down to 1). She had some pie and cookies over the holidays but hadn't had any in months prior to that,  Hypercholesterolemia--LDL was above goal, 146 in October, on crestor and zetia. Audelia Acton had suggested Repatha, but she never saw his comments on MyChart.  No anemia since hysterectomy, last Hgb 13.6 in October  Vitamin D deficiency--last level was 12.9 in October (prior was 25). She did not see Shane's comments, so hasn't started any vitamin D supplements.   Outpatient Encounter Medications as of 02/28/2021  Medication Sig Note   amLODipine (NORVASC) 5 MG tablet  TAKE 1 TABLET BY MOUTH IN THE MORNING AND IN THE EVENING 02/28/2021: Takes 1/2 QID   aspirin ([employer] ASPIRIN EC) 325 MG EC tablet Take 1 tablet (325 mg total) by mouth daily.    CRESTOR 20 MG tablet TAKE 1 TABLET BY MOUTH EVERYDAY AT BEDTIME    ezetimibe (ZETIA) 10 MG tablet TAKE 1 TABLET BY MOUTH EVERY DAY    No facility-administered encounter medications on file as of 02/28/2021.   Allergies  Allergen Reactions   Covid-19 (Adenovirus) Vaccine Other (See Comments)    High BP, pulses elevated, mild tongue swelling, mild dyspnea   Beta Adrenergic Blockers     Spasm, inorgasmic, not willing to do beta blocker   Contrast Media [Iodinated Contrast Media]     For CT Angio, breaks out in hives   Flexeril [Cyclobenzaprine]    Lipitor [Atorvastatin Calcium] Other (See Comments)    Pt states she had a bad reaction several years ago when taking this   Paxil [Paroxetine Hcl]     Tongue swelling had trouble swallowing    Sulfa Antibiotics Other (See Comments)    Unknown reaction   ROS: Denies fever, chills, URI symptoms, headaches, nausea, vomiting, bowel changes, urinary complaints, bleeding, bruising, rash. +chest pressure and shortness of breath, with occ dizziness per HPI. She denies abdominal pain. See HPI   PHYSICAL EXAM: BP 118/72    Pulse 92    Ht 5' 4" (1.626 m)  Wt 164 lb 9.6 oz (74.7 kg)    SpO2 99%    BMI 28.25 kg/m   Pleasant, well-appearing female, in no distress HEENT: conjunctiva and sclera are clear, EOMI, wearing mask Neck: no lymphadenopathy, thyromegaly or mass Heart: regular rate and rhythm Lungs: clear bilaterally Chest wall: nontender to palpation Abdomen: mild epigastric tenderness. No organomegaly or mass. No rebound tenderness or guarding Extremities: no edema Psych: normal mood, affect, hygiene and grooming Neuro: alert and oriented, normal strength, gait  EKG: NSR, no acute changes.  Very sl ST elevation noted (<19m) in II and V5,6. No significant change from  prior EKG.   ASSESSMENT/PLAN:  Chest pain, unspecified type - given epigastric tenderness and ASA use, suspect poss esophagitis. 2 wk trial of Prilosec OTC. Check labs - Plan: CBC with Differential/Platelet, Basic metabolic panel, EKG [id number] Shortness of breath - Plan: CBC with Differential/Platelet, Basic metabolic panel, EKG 175-OITG Vitamin D deficiency - she has trouble swallowing pills; rec 5000 IU x 3 mos, and 1000 IU daily longterm  Impaired fasting blood sugar - technically met criteria to Dx DM with A1c 6.6 on October labs. She cut back on sugar. To have A1c rechecked at f/u visit (too soon now)  Familial hypercholesterolemia - LDL above goal on last check.  Hasn't discussed treatment options.  To f/u with SAudelia Actonin 2 wks, come fasting for recheck  Reminded to return Cologuard  2 week trial of Prilosec OTC. Start Vit D supplement F/u in 2 weeks with SAudelia Actonfor med check (due for lipids, A1c), sooner if worsening symptoms.  Last cardiology visit reviewed after visit--echo, stress test 11/2019.  Repeat echo due 07/2021.

## 2021-03-01 ENCOUNTER — Encounter: Payer: Self-pay | Admitting: Family Medicine

## 2021-03-01 ENCOUNTER — Ambulatory Visit
Admission: RE | Admit: 2021-03-01 | Discharge: 2021-03-01 | Disposition: A | Discharge status: Home / Self Care | Payer: BC Managed Care – PPO | Source: Ambulatory Visit | Attending: Family Medicine | Admitting: Family Medicine

## 2021-03-01 DIAGNOSIS — R079 Chest pain, unspecified: Secondary | ICD-10-CM

## 2021-03-01 DIAGNOSIS — R0602 Shortness of breath: Secondary | ICD-10-CM

## 2021-03-01 LAB — CBC WITH DIFFERENTIAL/PLATELET
Basophils Absolute: 0 10*3/uL (ref 0.0–0.2)
Basos: 1 %
EOS (ABSOLUTE): 0.1 10*3/uL (ref 0.0–0.4)
Eos: 2 %
Hematocrit: 41.5 % (ref 34.0–46.6)
Hemoglobin: 13.5 g/dL (ref 11.1–15.9)
Immature Grans (Abs): 0 10*3/uL (ref 0.0–0.1)
Immature Granulocytes: 0 %
Lymphocytes Absolute: 2.5 10*3/uL (ref 0.7–3.1)
Lymphs: 37 %
MCH: 28.4 pg (ref 26.6–33.0)
MCHC: 32.5 g/dL (ref 31.5–35.7)
MCV: 87 fL (ref 79–97)
Monocytes Absolute: 0.6 10*3/uL (ref 0.1–0.9)
Monocytes: 8 %
Neutrophils Absolute: 3.6 10*3/uL (ref 1.4–7.0)
Neutrophils: 52 %
Platelets: 391 10*3/uL (ref 150–450)
RBC: 4.75 x10E6/uL (ref 3.77–5.28)
RDW: 12.6 % (ref 11.7–15.4)
WBC: 6.9 10*3/uL (ref 3.4–10.8)

## 2021-03-01 LAB — BASIC METABOLIC PANEL
BUN/Creatinine Ratio: 10 (ref 9–23)
BUN: 8 mg/dL (ref 6–24)
CO2: 24 mmol/L (ref 20–29)
Calcium: 9.4 mg/dL (ref 8.7–10.2)
Chloride: 103 mmol/L (ref 96–106)
Creatinine, Ser: 0.8 mg/dL (ref 0.57–1.00)
Glucose: 93 mg/dL (ref 70–99)
Potassium: 4.1 mmol/L (ref 3.5–5.2)
Sodium: 139 mmol/L (ref 134–144)
eGFR: 89 mL/min/{1.73_m2} (ref >59)

## 2021-03-14 ENCOUNTER — Ambulatory Visit: Payer: BC Managed Care – PPO | Admitting: Medical

## 2021-03-14 VITALS — BP 120/80 | HR 81 | Wt 163.0 lb

## 2021-03-14 DIAGNOSIS — R7301 Impaired fasting glucose: Secondary | ICD-10-CM

## 2021-03-14 DIAGNOSIS — E782 Mixed hyperlipidemia: Secondary | ICD-10-CM

## 2021-03-14 DIAGNOSIS — I10 Essential (primary) hypertension: Secondary | ICD-10-CM

## 2021-03-14 DIAGNOSIS — R9431 Abnormal electrocardiogram [ECG] [EKG]: Secondary | ICD-10-CM

## 2021-03-14 DIAGNOSIS — E559 Vitamin D deficiency, unspecified: Secondary | ICD-10-CM

## 2021-03-14 DIAGNOSIS — Z8679 Personal history of other diseases of the circulatory system: Secondary | ICD-10-CM

## 2021-03-14 DIAGNOSIS — R7303 Prediabetes: Secondary | ICD-10-CM | POA: Diagnosis not present

## 2021-03-14 DIAGNOSIS — Z9889 Other specified postprocedural states: Secondary | ICD-10-CM

## 2021-03-14 DIAGNOSIS — E7801 Familial hypercholesterolemia: Secondary | ICD-10-CM

## 2021-03-14 MED ORDER — PRALUENT 150 MG/ML ~~LOC~~ SOAJ: 150.0000 mg | SUBCUTANEOUS | 11 refills | Status: DC

## 2021-03-14 NOTE — Addendum Note (Signed)
Addended by: Jac Canavan on: 03/14/2021 02:48 PM   Modules accepted: Orders

## 2021-03-14 NOTE — Progress Notes (Signed)
Subjective:  Debbie [name] is a 51 y.o. female who presents for Chief Complaint  Patient presents with   med check    Med check . Had labs done today. Would like a recheck ekg/     Here for med check.  She was in recently with Dr. Tomi Bamberger for chest discomfort and other symptoms..  At that visit Dr. Tomi Bamberger noticed that she had not reviewed comments I put in "my chart" from her last labs from her physical a few months ago.  Thus she never started some of the recommendations that were listed  Here today to discuss abnormalities from her physical visit in October 2022  She is fasting today for labs  Prediabetes noted on last visit physical and labs.  No diabetic symptoms today.  No polyuria, no polydipsia, no blurred vision or recent weight change.  She feels like she is doing a really good job with diet and exercise.  She stays active and tries to eat healthy.  She drinks no sugary drinks.  She does not indulge on sweets and baked goods.  She tries to limit high cholesterol foods.  She would like a referral to nutritionist  She is allergic to shellfish continue but can eat salmon.  Hyperlipidemia-she has been on a few different statins in the past.  Prior statins were not effective enough.  She did not tolerate Lipitor at one point due to muscle aches and concern for other side effects.  She currently is tolerating Crestor and Zetia.  We had requested she add on something like Repatha last visit but she never saw this on "My Chart."  She has family with hypercholesterolemia.  She also notes that due to an insurance change she will not be able to afford Crestor after March of this year.  Vitamin D deficiency - after her recent visit with Dr. Tomi Bamberger, she did start vitamin D over-the-counter 4000 units daily  She has not completed her Cologuard test yet  Last visit there was a concern about her EKG.  She would like to repeat this today and discussed this  No other aggravating or relieving factors.     No other c/o.  Past Medical History:  Diagnosis Date   Anemia    history of   Aneurysm of carotid artery (HCC)    hx/o - coiling 02/07/2010   Chronic headache    since aneurysm diagnosis. mostly related to constipation as of 2017   Coronary artery calcification    Edema    Familial hypercholesteremia    LDL over 300; lipoprotein testing 01/2009   Family history of heart disease in female family member before age 6    LDL over 51 prior   H/O cardiovascular stress test 12/08/05   fair exercise capacity, no ST segment changes suggestive of ischemia   H/O echocardiogram 03/22/01   mild asymmetric LVH, mild aortic sclerosis, mild mitral regurgitation   History of blood transfusion    x2 due to heavy bleeding and anemia from fibroids   Hx of CT scan of chest 09/2016   Coronary calcium score of 15. 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 Lillia Carmel)   Vitamin D deficiency    Wears glasses    Current Outpatient Medications on File  Prior to Visit  Medication Sig Dispense Refill   amLODipine (NORVASC) 5 MG tablet TAKE 1 TABLET BY MOUTH IN THE MORNING AND IN THE EVENING 180 tablet 0   aspirin ([facility] ASPIRIN EC) 325 MG EC tablet Take 1 tablet (325 mg total) by mouth daily. 90 tablet 0   Cholecalciferol (VITAMIN D3) 50 MCG (2000 UT) CAPS Take by mouth.     CRESTOR 20 MG tablet TAKE 1 TABLET BY MOUTH EVERYDAY AT BEDTIME 90 tablet 1   ezetimibe (ZETIA) 10 MG tablet TAKE 1 TABLET BY MOUTH EVERY DAY 90 tablet 2   No current facility-administered medications on file prior to visit.    The following portions of the patient's history were reviewed and updated as appropriate: allergies, current medications, past family history, past medical history, past social history, past surgical history and  problem list.  ROS Otherwise as in subjective above  Objective: BP 120/80    Pulse 81    Wt 163 lb (73.9 kg)    BMI 27.98 kg/m   General appearance: alert, no distress, well developed, well nourished Heart: RRR, normal S1, S2, no murmurs Lungs: CTA bilaterally, no wheezes, rhonchi, or rales Pulses: 2+ radial pulses, 2+ pedal pulses, normal cap refill Ext: no edema  EKG Indication prior abnormal EKG, Rate 74 bpm, PR 206 ms, QRS 78 ms, QTC 4 1 ms, axis 40 degrees, normal sinus rhythm AV1 block with prolonged PR, J point elevation/ST change in I, II, V4 and V5  Assessment: Encounter Diagnoses  Name Primary?   Familial hypercholesterolemia    Mixed dyslipidemia Yes   Impaired fasting blood sugar    Prediabetes    Abnormal EKG    Vitamin D deficiency    History of cerebral aneurysm repair    Essential hypertension, benign      Plan: Family hypercholesterolemia-currently on Crestor and Zetia.  She did not really tolerate Lipitor in the past.  She notes insurance changes are going to force her off of Crestor after March.  She was agreeable to beginning either Praluent or Repatha to further get her LDL down below 100.  We counseled on low-cholesterol diet and regular exercise which she seems to be already doing.  Prediabetes-we spent a lot of time talking about preventing the progression to diabetes.  Talked about diet, exercise, routine follow-up, lab surveillance, possible medication such as metformin or Wegovy to help with sugars and weight loss.  She does desire some weight loss.   Reerral to nutrition counseling.  Abnormal EKG-I will consult with her cardiologist to discuss.  I reviewed her August 2022 echocardiogram and October 2021 myocardial stress test.  Vitamin D deficiency-she recently started over-the-counter vitamin D 4000 IU daily.  Plan to recheck this in 3 months, including vitamin D level and basic metabolic level  Hypertension-doing fine on current therapy  amlodipine 5 mg daily, BP at goal  History of cerebral aneurysm repair-continue to maintain normal blood pressures, routine follow-up.  The last several years and I have requested she do a follow-up with interventional radiology she has declined due to fear of complication of angiogram on repeat analysis  Debbie [name] was seen today for med check.  Diagnoses and all orders for this visit:  Mixed dyslipidemia -     Lipid panel  Familial hypercholesterolemia -     Lipid panel  Impaired fasting blood sugar -     Hemoglobin A1c -     Glucose, Random  Prediabetes  Abnormal EKG  Vitamin D deficiency  History of cerebral aneurysm repair  Essential hypertension, benign  Other orders -     Alirocumab (PRALUENT) 150 MG/ML SOAJ; Inject 150 mg into the skin every 21 ( twenty-one) days.    Follow up: pending labs, call back

## 2021-03-15 ENCOUNTER — Other Ambulatory Visit: Payer: Self-pay | Admitting: Medical

## 2021-03-15 LAB — LIPID PANEL
Chol/HDL Ratio: 4.3 ratio (ref 0.0–4.4)
Cholesterol, Total: 189 mg/dL (ref 100–199)
HDL: 44 mg/dL (ref >39)
LDL Chol Calc (NIH): 135 mg/dL — ABNORMAL HIGH (ref 0–99)
Triglycerides: 55 mg/dL (ref 0–149)
VLDL Cholesterol Cal: 10 mg/dL (ref 5–40)

## 2021-03-15 LAB — GLUCOSE, RANDOM: Glucose: 96 mg/dL (ref 70–99)

## 2021-03-15 LAB — HEMOGLOBIN A1C
Est. average glucose Bld gHb Est-mCnc: 134 mg/dL
Hgb A1c MFr Bld: 6.3 % — ABNORMAL HIGH (ref 4.8–5.6)

## 2021-03-15 MED ORDER — ASPIRIN 325 MG PO TBEC: 325.0000 mg | DELAYED_RELEASE_TABLET | Freq: Every day | ORAL | 3 refills | Status: AC

## 2021-03-15 NOTE — Addendum Note (Signed)
Addended by: Minette Headland A on: 03/15/2021 07:50 AM   Modules accepted: Orders

## 2021-03-16 ENCOUNTER — Telehealth: Payer: Self-pay

## 2021-03-16 NOTE — Telephone Encounter (Signed)
P.A. PRALUENT  

## 2021-03-19 ENCOUNTER — Telehealth: Payer: Self-pay | Admitting: Medical

## 2021-03-19 ENCOUNTER — Telehealth: Payer: Self-pay

## 2021-03-19 NOTE — Telephone Encounter (Signed)
Pt called back to see if you had any info from cardiologist regarding the EKG?

## 2021-03-19 NOTE — Telephone Encounter (Signed)
Hello Dr. Odis Hollingshead  Could you review her recent EKG scanned in and see if you have any concerns compared to prior EKG/   there was some question about ST elevation  Debbie [name]

## 2021-03-20 NOTE — Telephone Encounter (Signed)
Hi Shane,  I reviewed the EKG from 19th Jan around 1pm.  No ischemia.  No change in management if she is asymptomatic.   Carmellia Kreisler

## 2021-03-21 NOTE — Telephone Encounter (Signed)
Pt was notified.  

## 2021-03-22 NOTE — Telephone Encounter (Signed)
P.A. approved til 03/22/22, activated discount card.  Tried calling pharmacy multiple times and help for hour and unable to reach anyone.  Faxed approval information & discount card to pharmacy.  Called pt and informed.

## 2021-03-22 NOTE — Telephone Encounter (Signed)
done

## 2021-04-10 ENCOUNTER — Encounter: Payer: Self-pay | Admitting: Internal Medicine

## 2021-06-05 ENCOUNTER — Encounter: Payer: BC Managed Care – PPO | Attending: Medical | Admitting: Registered"

## 2021-06-05 DIAGNOSIS — I10 Essential (primary) hypertension: Secondary | ICD-10-CM | POA: Diagnosis not present

## 2021-06-05 DIAGNOSIS — Z713 Dietary counseling and surveillance: Secondary | ICD-10-CM | POA: Diagnosis not present

## 2021-06-05 DIAGNOSIS — E7801 Familial hypercholesterolemia: Secondary | ICD-10-CM

## 2021-06-05 DIAGNOSIS — R7303 Prediabetes: Secondary | ICD-10-CM | POA: Insufficient documentation

## 2021-06-05 NOTE — Progress Notes (Signed)
Medical Nutrition Therapy  ?Appointment Start time:  1015  Appointment End time:  1115 ? ?Primary concerns today: wants to eat better, concerned about pre-diabetes diagnosis  ?Referral diagnosis: R73 (high cholesterol) ?Preferred learning style: no preference indicated ?Learning readiness: ready, change in progress ? ? ?NUTRITION ASSESSMENT  ? ?Anthropometrics  ?Wt Readings from Last 3 Encounters:  ?06/06/21 161 lb 3.2 oz (73.1 kg)  ?03/14/21 163 lb (73.9 kg)  ?02/28/21 164 lb 9.6 oz (74.7 kg)  ? ? ?Clinical ?Medical Hx: reviewed, familial hypercholesteremia ?Medications: reviewed ?Labs: A1c 6.3%, fasting 96 mg/dL; was 0.7% in Oct. ?Notable Signs/Symptoms: fatigue, weakness. ? ?Lifestyle & Dietary Hx ?Pt reports that her mother-in-law fixes Sunday dinner with desserts and used to indulge but has been practicing discipline since pre-diabetes dx. Pt states she also used to drink a lot of lemonade and sweet tea, but now drinking more water and limiting white rice and white potatoes. ? ?Physical Activity: not much since heart palpatations side effect from COVID vaccine, now afraid to do much. Has started walking 1-2 x/week.  Pt states her cardiologist said walking is safe.  ? ?Pt states when she wasn't eating meat would get weak inbetween meals. Pt states she needs to eat every few hours because she gets funky feeling. Pt states she has trained her tastebuds to like berries. ? ?Pt states sometimes she is hungry after work and will stop for fast food to tide over until dinner.  ? ?Pt states stubborn weight gain started after hysterectomy. ? ?Estimated daily fluid intake: 80+ oz water ?Supplements: MVI gummy, vit C gummy (states hard to swallow pills) ?Sleep: 5-6 hrs per night. ?Stress / self-care: "minimal" ?Current average weekly physical activity: ADL ? ?24-Hr Dietary Recall ?First Meal: english muffin 1 T cream cheese, water ?Snack: apple, granola, raisins, craisins, strawberries, okios yogurt ?Second Meal: 11:30-12  wrap, or left overs ?Snack: (3 pm feels week, sleepy) fruit, nuts ?Third Meal: (7-8 pm) salmon, [place] or tenderloin, veggies, sweet potato ?Snack: grapes ?Beverages: water, herbal tea 1 packet of sugar ? ? ?NUTRITION DIAGNOSIS  ?NB-1.1 Food and nutrition-related knowledge deficit As related to balanced eating and using body cues for portion sizes.  As evidenced by patient feeling weak from taking in enough nutrition, and may not be taking in enough fiber. ? ? ?NUTRITION INTERVENTION  ?Nutrition education (E-1) on the following topics:  ?Role of fiber in the diet and cholesterol control ?Balanced eating including importance of snacks ?MyPlate ?Exercise ? ?Handouts Provided Include  ?Types of Fiber ?Snack Time! ?MyPlate Planner ? ?Learning Style & Readiness for Change ?Teaching method utilized: Visual & Auditory  ?Demonstrated degree of understanding via: Teach Back  ?Barriers to learning/adherence to lifestyle change: none ? ?Goals Established by Pt ?Ask one of your teammates at work to help with accountability to exercise in the morning. ?Continue having fresh & frozen vegetables ?Review the Fiber handout to get ideas to get plenty of fiber in your diet. ?Consider having a protein smoothie for your afternoon pick-me-up ?Use the snack sheet to get some fresh ideas ? ? ?MONITORING & EVALUATION ?Dietary intake, weekly physical activity, and energy level in 2 months. ?

## 2021-06-05 NOTE — Patient Instructions (Addendum)
Ask one of your teammates at work to help with accountability to exercise in the morning. ?Continue having fresh & frozen vegetables ?Review the Fiber handout to get ideas to get plenty of fiber in your diet. ?Consider having a protein smoothie for your afternoon pick-me-up ?Use the snack sheet to get some fresh ideas ?

## 2021-06-06 ENCOUNTER — Encounter: Payer: Self-pay | Admitting: Registered"

## 2021-06-21 ENCOUNTER — Other Ambulatory Visit: Payer: Self-pay | Admitting: Internal Medicine

## 2021-06-21 DIAGNOSIS — Z1211 Encounter for screening for malignant neoplasm of colon: Secondary | ICD-10-CM

## 2021-06-21 LAB — COLOGUARD

## 2021-06-27 ENCOUNTER — Other Ambulatory Visit: Payer: Self-pay | Admitting: Medical

## 2021-07-08 ENCOUNTER — Encounter: Payer: BC Managed Care – PPO | Attending: Medical | Admitting: Registered"

## 2021-07-08 DIAGNOSIS — E782 Mixed hyperlipidemia: Secondary | ICD-10-CM | POA: Insufficient documentation

## 2021-07-08 NOTE — Patient Instructions (Addendum)
Get out your Hello Fresh meal cards to get ideas of what you can re-create ?Use the Anatomy of a Grain bowl  ?Continue exercise as tolerated and keep up with your friends for accountability ?Start including resistance and flexibility. ?

## 2021-07-08 NOTE — Progress Notes (Signed)
Medical Nutrition Therapy  ?Appointment Start time:  1445   Appointment End time:  1515 ? ?Primary concerns today: wants to eat better, concerned about pre-diabetes diagnosis  ?Referral diagnosis: R73 (high cholesterol) ?Preferred learning style: no preference indicated ?Learning readiness: ready, change in progress ? ? ?NUTRITION ASSESSMENT  ? ?Anthropometrics  ?Wt Readings from Last 3 Encounters:  ?06/06/21 161 lb 3.2 oz (73.1 kg)  ?03/14/21 163 lb (73.9 kg)  ?02/28/21 164 lb 9.6 oz (74.7 kg)  ? ? ?Clinical ?Medical Hx: reviewed, familial hypercholesteremia ?Medications: reviewed ?Labs: A1c 6.3%, fasting 96 mg/dL; was 0.5% in Oct. ?Notable Signs/Symptoms: fatigue, weakness. ? ?Lifestyle & Dietary Hx ?Pt reports she still has some desserts in moderation when going to mother-in-law's for Sunday dinner  ? ?Physical Activity: walking 5x/week. Pt states she has her friends for accountability and will call her when she exercises. Pt has been able to start regular routine for walking in the evening and the timing works to walk around the block in her neighborhood while her dinner is baking. ? ?Diet: Pt states she would like more variety in her meals, is running out of ideas. Pt states she has snacks such as cheese wedge and grapes that she can eat on way home, eliminates temptation of all the fast food restaurants on way home. ? ?Pt states she is at a weight plateau but is willing to start paying attention to other changes such as increased energy, better sleep, and feel better in her clothes in general. ? ?Estimated daily fluid intake: 80+ oz water ?Supplements: MVI gummy, vit C gummy (states hard to swallow pills) ?Sleep: 5-6 hrs per night. ?Stress / self-care: "minimal" ?Current average weekly physical activity: ADL ? ?24-Hr Dietary Recall ?First Meal: rolls, smoky sausages with sauteed vege; egg, cheese spinach omelette  ?Snack: none ?Second Meal: 11:30-12 salad. learning to enjoy salads with variety. ?Snack:  ?Third  Meal: (7-8 pm)  ?Snack: grapes, cheese ?Beverages: water, herbal tea 1 packet of sugar ? ? ?NUTRITION DIAGNOSIS  ?NB-1.1 Food and nutrition-related knowledge deficit As related to balanced eating and using body cues for portion sizes.  As evidenced by patient feeling weak from taking in enough nutrition, and may not be taking in enough fiber. ? ? ?NUTRITION INTERVENTION  ?Nutrition education (E-1) on the following topics:  ?Resources to help build balanced meals ?Types of exercise and benefit of variety ? ?Handouts Provided Include  ?Anatomy of a Grain bowl ? ?Learning Style & Readiness for Change ?Teaching method utilized: Visual & Auditory  ?Demonstrated degree of understanding via: Teach Back  ?Barriers to learning/adherence to lifestyle change: none ? ?Goals Established by Pt ?Get out your Hello Fresh meal cards to get ideas of what you can re-create ?Use the Anatomy of a Grain bowl  ?Continue exercise as tolerated and keep up with your friends for accountability ?Start including resistance and flexibility. ? ? ?MONITORING & EVALUATION ?Dietary intake, weekly physical activity, and energy level in 2 months. ?

## 2021-07-25 ENCOUNTER — Other Ambulatory Visit: Payer: Self-pay | Admitting: Medical

## 2021-08-15 ENCOUNTER — Other Ambulatory Visit: Payer: Self-pay | Admitting: Cardiology

## 2021-08-15 DIAGNOSIS — I10 Essential (primary) hypertension: Secondary | ICD-10-CM

## 2021-08-21 ENCOUNTER — Ambulatory Visit: Payer: BC Managed Care – PPO | Admitting: Medical

## 2021-08-21 ENCOUNTER — Other Ambulatory Visit: Payer: Self-pay | Admitting: Medical

## 2021-08-21 VITALS — BP 110/70 | HR 84 | Temp 98.2°F | Wt 160.2 lb

## 2021-08-21 DIAGNOSIS — E782 Mixed hyperlipidemia: Secondary | ICD-10-CM

## 2021-08-21 DIAGNOSIS — I10 Essential (primary) hypertension: Secondary | ICD-10-CM | POA: Diagnosis not present

## 2021-08-21 DIAGNOSIS — M722 Plantar fascial fibromatosis: Secondary | ICD-10-CM | POA: Insufficient documentation

## 2021-08-21 DIAGNOSIS — E7801 Familial hypercholesterolemia: Secondary | ICD-10-CM | POA: Diagnosis not present

## 2021-08-21 DIAGNOSIS — R1013 Epigastric pain: Secondary | ICD-10-CM | POA: Insufficient documentation

## 2021-08-21 DIAGNOSIS — Z9889 Other specified postprocedural states: Secondary | ICD-10-CM

## 2021-08-21 DIAGNOSIS — M79671 Pain in right foot: Secondary | ICD-10-CM

## 2021-08-21 DIAGNOSIS — M79672 Pain in left foot: Secondary | ICD-10-CM

## 2021-08-21 DIAGNOSIS — Z8249 Family history of ischemic heart disease and other diseases of the circulatory system: Secondary | ICD-10-CM

## 2021-08-21 DIAGNOSIS — Z8679 Personal history of other diseases of the circulatory system: Secondary | ICD-10-CM

## 2021-08-21 MED ORDER — SUCRALFATE 1 GM/10ML PO SUSP: 1.0000 g | Freq: Three times a day (TID) | ORAL | 0 refills | Status: DC

## 2021-08-21 MED ORDER — OMEPRAZOLE 40 MG PO CPDR: 40.0000 mg | DELAYED_RELEASE_CAPSULE | Freq: Every day | ORAL | 1 refills | Status: DC

## 2021-08-21 NOTE — Progress Notes (Signed)
Subjective:  Debbie [name] is a 51 y.o. female who presents for Chief Complaint  Patient presents with   foot pain    Foot pain- both- its like the bone. Left foot was swollen and stiff. Been going on a couple months.     Abdominal Pain    Abdominal pain x 3 days, at the left side under ribs and at top of stomach. Constant pain - level is 5 for pain      Here for 2 issues.    She notes abdominal pain x 3 days.   Pain is epigastric, constantly, radiates to the back.   No nausea, no vomiting, no diarrhea, no constipation.   Last BM last night normal, but a little softer than normal.  Does not use alcohol.  No recent NSAID use but does take aspirin for heart and stroke disease prevention.  Avoids spicy and acidic foods in general.   No urinary c/o, no blood in urine, no recent trauma or injury.   She notes her feet have been aching on and off for several months.  Hurts in the middle of her feet.  No injury or fall or trauma.  Hurts worse if going barefooted.  She felt like her feet have been swollen at times.  No numbness or tingling or weakness.  Bones in middle of feet ache bad  She in general has been taking her cholesterol medicine and aspirin but she ran out of her cholesterol medicine over a month ago pending yearly prior authorization.  She said the pharmacy was going to send something to our office about this.  Hypertension-compliant with blood pressure medicine daily  No other aggravating or relieving factors.    No other c/o.  Past Medical History:  Diagnosis Date   Anemia    history of   Aneurysm of carotid artery (HCC)    hx/o - coiling 02/07/2010   Chronic headache    since aneurysm diagnosis. mostly related to constipation as of 2017   Coronary artery calcification    Edema    Familial hypercholesteremia    LDL over 300; lipoprotein testing 01/2009   Family history of heart disease in female family member before age 36    LDL over 300 prior   H/O cardiovascular stress  test 12/08/05   fair exercise capacity, no ST segment changes suggestive of ischemia   H/O echocardiogram 03/22/01   mild asymmetric LVH, mild aortic sclerosis, mild mitral regurgitation   History of blood transfusion    x2 due to heavy bleeding and anemia from fibroids   Hx of CT scan of chest 09/2016   Coronary calcium score of 15. This was 74 percentile for age and sex   Hypertension    Prediabetes    S/P partial hysterectomy    due to uterine fibroids   Vertebral artery aneurysm (HCC)    left, unruptured, no surgery correction, monitoring since 2011 (followed by Dr. Nigel Sloop)   Vitamin D deficiency    Wears glasses    Current Outpatient Medications on File Prior to Visit  Medication Sig Dispense Refill   amLODipine (NORVASC) 5 MG tablet TAKE 1 TABLET BY MOUTH IN THE MORNING AND IN THE EVENING 180 tablet 0   aspirin ([employer] ASPIRIN EC) 325 MG EC tablet Take 1 tablet (325 mg total) by mouth daily. 90 tablet 3   Cholecalciferol (VITAMIN D3) 50 MCG (2000 UT) CAPS Take 5,000 Units by mouth daily.     CRESTOR 20 MG tablet  TAKE 1 TABLET BY MOUTH EVERYDAY AT BEDTIME 90 tablet 1   ezetimibe (ZETIA) 10 MG tablet Take 10 mg by mouth daily.     No current facility-administered medications on file prior to visit.     The following portions of the patient's history were reviewed and updated as appropriate: allergies, current medications, past family history, past medical history, past social history, past surgical history and problem list.  ROS Otherwise as in subjective above  Objective: BP 110/70   Pulse 84   Temp 98.2 F (36.8 C)   Wt 160 lb 3.2 oz (72.7 kg)   BMI 27.07 kg/m   General appearance: alert, no distress, well developed, well nourished, seems to be having some abdominal discomfort Neck: supple, no lymphadenopathy, no thyromegaly, no masses Heart: RRR, normal S1, S2, no murmurs Lungs: CTA bilaterally, no wheezes, rhonchi, or rales Abdomen: +bs, soft, tender epigastric  region, otherwise non tender, non distended, no masses, no hepatomegaly, no splenomegaly MSK: Tender in the midfoot of both feet and her arches flatten out when she stands on the floor barefooted.  Otherwise feet without deformity, no swelling, normal range of motion, rest of legs unremarkable Pulses: 2+ radial pulses, 2+ pedal pulses, normal cap refill Ext: no edema   Assessment: Encounter Diagnoses  Name Primary?   Epigastric pain Yes   Plantar fasciitis    Essential hypertension, benign    Familial hypercholesterolemia    History of cerebral aneurysm repair    Family history of heart disease in female family member before age 39    Mixed dyslipidemia    Foot pain, bilateral      Plan: Epigastric pain-likely gastritis versus ulcer given her symptoms and exam.  Less likely pancreatitis as she does not drink alcohol and triglycerides have been normal.  We discussed other potential differential.  Patient Instructions  Your current exam and symptoms suggest gastritis versus ulcer Begin omeprazole acid reflux medication preferably 45 to 60 minutes before breakfast, for 2 weeks Begin sucralfate liquid 30 minutes before meals to coat the stomach, for the next week You can use the sucralfate 20 to 30 minutes after omeprazole in the morning before breakfast Also begin Pepto-Bismol twice a day midmorning midafternoon for the next week Avoid spicy, acidic, citrus-based foods Avoid NSAIDs such as ibuprofen or Aleve or Motrin or Advil Hold your aspirin for 1-2 weeks while we await your symptom improvement If worse or not seen some improvement by the end of this week call back Plan to recheck in 2 weeks  Foot pain, planter fasciitis Discussed exam findings, symptoms, and etiology appears to be plantar fascitis and pes planus.  Discussed diagnosis, treatment recommendations, and follow up.     Plantar fascitis Every morning try doing a tennis ball massage with feet on the floor and towel  stretch behind the ball of the foot to stretch the plantar fascia Order plantar fascitis night time 90 degree splints online, through [employer] for example.   They are typically about $25 each Avoid going barefoot since your feet flatten out without good arch support I recommend you use arch support shoe inserts.   This will help support the plantar fascia and arches F/u in a month   Hyperlipidemia-she had been compliant with statin but ran out.  I will check on the status of prior off.  I have not received any prior Auth request on this medication and assumed she was still taking it daily based on refill dates  Hypertension-continue current medication  History of premature cardiovascular disease, history of cerebral aneurysm repair-the goal is to continue medication management with aspirin and statin and good blood pressure control but given her current potential for ulcer we are going to hold off on the aspirin temporarily or at least cut back to lower dose until we see if symptoms improve or not.  She may ultimately need to go for EGD to rule out ulcer.  Prisma was seen today for foot pain and abdominal pain.  Diagnoses and all orders for this visit:  Epigastric pain  Plantar fasciitis  Essential hypertension, benign  Familial hypercholesterolemia  History of cerebral aneurysm repair  Family history of heart disease in female family member before age 72  Mixed dyslipidemia  Foot pain, bilateral  Other orders -     omeprazole (PRILOSEC) 40 MG capsule; Take 1 capsule (40 mg total) by mouth daily. -     sucralfate (CARAFATE) 1 GM/10ML suspension; Take 10 mLs (1 g total) by mouth 4 (four) times daily -  with meals and at bedtime.    Follow up: 2wk

## 2021-08-21 NOTE — Patient Instructions (Signed)
Your current exam and symptoms suggest gastritis versus ulcer Begin omeprazole acid reflux medication preferably 45 to 60 minutes before breakfast, for 2 weeks Begin sucralfate liquid 30 minutes before meals to coat the stomach, for the next week You can use the sucralfate 20 to 30 minutes after omeprazole in the morning before breakfast Also begin Pepto-Bismol twice a day midmorning midafternoon for the next week Avoid spicy, acidic, citrus-based foods Avoid NSAIDs such as ibuprofen or Aleve or Motrin or Advil Hold your aspirin for 1-2 weeks while we await your symptom improvement If worse or not seen some improvement by the end of this week call back Plan to recheck in 2 weeks

## 2021-08-22 ENCOUNTER — Telehealth: Payer: Self-pay | Admitting: Medical

## 2021-08-22 NOTE — Telephone Encounter (Signed)
P.A. Idalia Needle

## 2021-08-23 NOTE — Telephone Encounter (Signed)
PA completed.

## 2021-08-23 NOTE — Telephone Encounter (Signed)
P.A. approved til 08/23/22 JG#[id number] went thru for $279 with discount card for 90 days, pt informed

## 2021-08-26 NOTE — Telephone Encounter (Signed)
PA approved, pt informed.

## 2021-08-31 NOTE — Progress Notes (Signed)
PA done and approved.

## 2021-09-02 ENCOUNTER — Ambulatory Visit: Payer: BC Managed Care – PPO | Admitting: Medical

## 2021-09-02 ENCOUNTER — Encounter: Payer: Self-pay | Admitting: Medical

## 2021-09-02 VITALS — BP 120/70 | HR 67 | Wt 159.8 lb

## 2021-09-02 DIAGNOSIS — E782 Mixed hyperlipidemia: Secondary | ICD-10-CM

## 2021-09-02 DIAGNOSIS — R1013 Epigastric pain: Secondary | ICD-10-CM | POA: Diagnosis not present

## 2021-09-02 DIAGNOSIS — M722 Plantar fascial fibromatosis: Secondary | ICD-10-CM

## 2021-09-02 DIAGNOSIS — M79671 Pain in right foot: Secondary | ICD-10-CM

## 2021-09-02 DIAGNOSIS — M79672 Pain in left foot: Secondary | ICD-10-CM

## 2021-09-02 NOTE — Progress Notes (Signed)
Subjective:  Debbie [name] is a 51 y.o. female who presents for Chief Complaint  Patient presents with   Follow-up    2 week follow up on stomach pain. She feels much better now and would like referral for foot Dr.     Here for f/u.   I saw her on 08/21/21 for several concerns  Her main concern that last visit was abdominal pain, epigastric pain radiating to the back.   In hindsight, she thinks it was constipation.  She didn't end up using the omeprazole or sucralfate. Within 3 days her pain subsided.   She had one day with multiple BMs, and symptoms resolved shortly thereafter within 3 days of last visit.    She still has foot pain and wants referral to podiatry.  Last visit she noted her feet have been aching on and off for several months.  Hurts in the middle of her feet.  No injury or fall or trauma.  Hurts worse if going barefooted.  She felt like her feet have been swollen at times.  No numbness or tingling or weakness.  Bones in middle of feet ache bad.  Been using cold water soaks, towel stretch as discussed last visit.  Hyperlipidemia -she was able to get her med refills for cholesterol medication after last visit and is compliant.   No other aggravating or relieving factors.    No other c/o.  Past Medical History:  Diagnosis Date   Anemia    history of   Aneurysm of carotid artery (HCC)    hx/o - coiling 02/07/2010   Chronic headache    since aneurysm diagnosis. mostly related to constipation as of 2017   Coronary artery calcification    Edema    Familial hypercholesteremia    LDL over 300; lipoprotein testing 01/2009   Family history of heart disease in female family member before age 93    LDL over 300 prior   H/O cardiovascular stress test 12/08/05   fair exercise capacity, no ST segment changes suggestive of ischemia   H/O echocardiogram 03/22/01   mild asymmetric LVH, mild aortic sclerosis, mild mitral regurgitation   History of blood transfusion    x2 due to heavy  bleeding and anemia from fibroids   Hx of CT scan of chest 09/2016   Coronary calcium score of 15. This was 102 percentile for age and sex   Hypertension    Prediabetes    S/P partial hysterectomy    due to uterine fibroids   Vertebral artery aneurysm (HCC)    left, unruptured, no surgery correction, monitoring since 2011 (followed by Dr. Nigel Sloop)   Vitamin D deficiency    Wears glasses    Current Outpatient Medications on File Prior to Visit  Medication Sig Dispense Refill   amLODipine (NORVASC) 5 MG tablet TAKE 1 TABLET BY MOUTH IN THE MORNING AND IN THE EVENING 180 tablet 0   aspirin ([employer] ASPIRIN EC) 325 MG EC tablet Take 1 tablet (325 mg total) by mouth daily. 90 tablet 3   Cholecalciferol (VITAMIN D3) 50 MCG (2000 UT) CAPS Take 5,000 Units by mouth daily.     CRESTOR 20 MG tablet TAKE 1 TABLET BY MOUTH EVERYDAY AT BEDTIME 90 tablet 1   ezetimibe (ZETIA) 10 MG tablet Take 10 mg by mouth daily.     omeprazole (PRILOSEC) 40 MG capsule Take 1 capsule (40 mg total) by mouth daily. (Patient not taking: Reported on 09/02/2021) 30 capsule 1   sucralfate (  CARAFATE) 1 GM/10ML suspension Take 10 mLs (1 g total) by mouth 4 (four) times daily -  with meals and at bedtime. (Patient not taking: Reported on 09/02/2021) 420 mL 0   No current facility-administered medications on file prior to visit.     The following portions of the patient's history were reviewed and updated as appropriate: allergies, current medications, past family history, past medical history, past social history, past surgical history and problem list.  ROS Otherwise as in subjective above    Objective: BP 120/70   Pulse 67   Wt 159 lb 12.8 oz (72.5 kg)   SpO2 100%   BMI 27.01 kg/m   General appearance: alert, no distress, well developed, well nourished Otherwise not examined    Assessment: Encounter Diagnoses  Name Primary?   Foot pain, bilateral Yes   Plantar fasciitis    Mixed dyslipidemia     Epigastric pain       Plan: Epigastric pain-resolved from recent visit in late June  Foot pain, planter fasciitis, foot pain - Discussed exam findings, symptoms, and etiology appears to be plantar fascitis and pes planus.  Discussed diagnosis, treatment recommendations, and follow up.     Continue towel stretching, tennis ball massage, avoid barefooted, referral to podiatry for further eval and treatment options  Hyperlipidemia-continue current medication   Rabiah was seen today for follow-up.  Diagnoses and all orders for this visit:  Foot pain, bilateral -     Ambulatory referral to Podiatry  Plantar fasciitis -     Ambulatory referral to Podiatry  Mixed dyslipidemia  Epigastric pain   Follow up: pending podiatry consult

## 2021-09-12 ENCOUNTER — Other Ambulatory Visit: Payer: Self-pay | Admitting: Medical

## 2021-09-16 ENCOUNTER — Encounter: Payer: Self-pay | Admitting: Podiatry

## 2021-09-16 ENCOUNTER — Ambulatory Visit (INDEPENDENT_AMBULATORY_CARE_PROVIDER_SITE_OTHER): Payer: BC Managed Care – PPO

## 2021-09-16 ENCOUNTER — Other Ambulatory Visit: Payer: Self-pay | Admitting: Podiatry

## 2021-09-16 ENCOUNTER — Ambulatory Visit: Payer: BC Managed Care – PPO | Admitting: Podiatry

## 2021-09-16 DIAGNOSIS — M722 Plantar fascial fibromatosis: Secondary | ICD-10-CM

## 2021-09-16 DIAGNOSIS — M778 Other enthesopathies, not elsewhere classified: Secondary | ICD-10-CM

## 2021-09-16 MED ORDER — TRIAMCINOLONE ACETONIDE 40 MG/ML IJ SUSP
40.0000 mg | Freq: Once | INTRAMUSCULAR | Status: AC
  Administered 2021-09-16: 40 mg

## 2021-09-16 MED ORDER — MELOXICAM 15 MG PO TABS: 15.0000 mg | ORAL_TABLET | Freq: Every day | ORAL | 3 refills | Status: DC

## 2021-09-16 MED ORDER — METHYLPREDNISOLONE 4 MG PO TBPK: ORAL_TABLET | ORAL | 0 refills | Status: DC

## 2021-09-16 NOTE — Patient Instructions (Signed)

## 2021-09-16 NOTE — Progress Notes (Signed)
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 female presents with the above complaint.   ROS: She denies fever chills nausea vomiting muscle aches pains calf pain back pain chest pain shortness of breath.  Past Medical History:  Diagnosis Date   Anemia    history of   Aneurysm of carotid artery (HCC)    hx/o - coiling 02/07/2010   Chronic headache    since aneurysm diagnosis. mostly related to constipation as of 2017   Coronary artery calcification    Edema    Familial hypercholesteremia    LDL over 300; lipoprotein testing 01/2009   Family history of heart disease in female family member before age 50    LDL over 300 prior   H/O cardiovascular stress test 12/08/05   fair exercise capacity, no ST segment changes suggestive of ischemia   H/O echocardiogram 03/22/01   mild asymmetric LVH, mild aortic sclerosis, mild mitral regurgitation   History of blood transfusion    x2 due to heavy bleeding and anemia from fibroids   Hx of CT scan of chest 09/2016   Coronary calcium score of 15. This was 51 percentile for age and sex   Hypertension    Prediabetes    S/P partial hysterectomy    due to uterine fibroids   Vertebral artery aneurysm (HCC)    left, unruptured, no surgery correction, monitoring since 2011 (followed by Dr. Nigel Sloop)   Vitamin D deficiency    Wears glasses    Past Surgical History:  Procedure Laterality Date   ABDOMINAL HYSTERECTOMY     ANEURYSM COILING  02/07/10   stent assisted coiling of right internal carotid artery aneurysm in periophthalmic region   CESAREAN SECTION     COLONOSCOPY  2011   Dr. Loreta Ave, anemia workup   ESOPHAGOGASTRODUODENOSCOPY  2011   Dr. Loreta Ave   HERNIA REPAIR     epigastric  hernia repair age 33, right inguinal hernia as a child   PARTIAL HYSTERECTOMY     abdominal due to fibroids, still has ovaries, Dr. Layne Benton   TUBAL LIGATION     tubal reversal  2008    Current Outpatient Medications:    meloxicam (MOBIC) 15 MG tablet, Take 1 tablet (15 mg total) by mouth daily., Disp: 30 tablet, Rfl: 3   methylPREDNISolone (MEDROL DOSEPAK) 4 MG TBPK tablet, 6 day dose pack - take as directed, Disp: 21 tablet, Rfl: 0   amLODipine (NORVASC) 5 MG tablet, TAKE 1 TABLET BY MOUTH IN THE MORNING AND IN THE EVENING, Disp: 180 tablet, Rfl: 0   aspirin ([employer] ASPIRIN EC) 325 MG EC tablet, Take 1 tablet (325 mg total) by mouth daily., Disp: 90 tablet, Rfl: 3   Cholecalciferol (VITAMIN D3) 50 MCG (2000 UT) CAPS, Take 5,000 Units by mouth daily., Disp: , Rfl:    CRESTOR 20 MG tablet, TAKE 1 TABLET BY MOUTH EVERYDAY AT BEDTIME, Disp: 90 tablet, Rfl: 1   ezetimibe (ZETIA) 10 MG tablet, Take 10 mg by mouth daily., Disp: , Rfl:   Allergies  Allergen Reactions   Covid-19 (Mrna) Vaccine Anaphylaxis   Covid-19 (Adenovirus) Vaccine Other (See Comments)    High BP, pulses elevated, mild tongue  swelling, mild dyspnea   Beta Adrenergic Blockers     Spasm, inorgasmic, not willing to do beta blocker   Contrast Media [Iodinated Contrast Media]     For CT Angio, breaks out in hives   Flexeril [Cyclobenzaprine]    Lipitor [Atorvastatin Calcium] Other (See Comments)    Pt states she had a bad reaction several years ago when taking this   Paxil [Paroxetine Hcl]     Tongue swelling had trouble swallowing    Sulfa Antibiotics Other (See Comments)    Unknown reaction   Review of Systems Objective:  There were no vitals filed for this visit.  General: Well developed, nourished, in no acute distress, alert and oriented x3   Dermatological: Skin is warm, dry and supple bilateral. Nails x 10 are well maintained; remaining integument appears unremarkable at this time. There are no open sores, no  preulcerative lesions, no rash or signs of infection present.  Vascular: Dorsalis Pedis artery and Posterior Tibial artery pedal pulses are 2/4 bilateral with immedate capillary fill time. Pedal hair growth present. No varicosities and no lower extremity edema present bilateral.   Neruologic: Grossly intact via light touch bilateral. Vibratory intact via tuning fork bilateral. Protective threshold with Semmes Wienstein monofilament intact to all pedal sites bilateral. Patellar and Achilles deep tendon reflexes 2+ bilateral. No Babinski or clonus noted bilateral.   Musculoskeletal: No gross boney pedal deformities bilateral. No pain, crepitus, or limitation noted with foot and ankle range of motion bilateral. Muscular strength 5/5 in all groups tested bilateral.  Pain on palpation medial calcaneal tubercles bilateral with pain on palpation of fourth fifth tarsometatarsal joints bilaterally.  Gait: Unassisted, Nonantalgic.    Radiographs:  Radiographs taken today demonstrate a slight increase in the soft tissue density at the plantar fascial Caney insertion site indicative of Planter fasciitis.  No significant osteoarthritis of the midfoot.  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  Plan: Discussed etiology pathology conservative surgical therapies at this point I injected the heels today bilaterally start her on methylprednisolone to be followed by meloxicam.  Discussed appropriate shoe gear stretching exercise and ice therapy provided her with plantar fascia braces as well.     Laquida Cotrell T. [place], [place]

## 2021-09-17 ENCOUNTER — Other Ambulatory Visit: Payer: Self-pay | Admitting: Medical

## 2021-09-18 ENCOUNTER — Other Ambulatory Visit: Payer: Self-pay | Admitting: Cardiology

## 2021-09-18 ENCOUNTER — Encounter: Payer: Self-pay | Admitting: Registered"

## 2021-09-18 ENCOUNTER — Encounter: Payer: BC Managed Care – PPO | Attending: Medical | Admitting: Registered"

## 2021-09-18 DIAGNOSIS — E782 Mixed hyperlipidemia: Secondary | ICD-10-CM

## 2021-09-18 DIAGNOSIS — I10 Essential (primary) hypertension: Secondary | ICD-10-CM

## 2021-09-18 DIAGNOSIS — Z713 Dietary counseling and surveillance: Secondary | ICD-10-CM | POA: Insufficient documentation

## 2021-09-18 DIAGNOSIS — R7301 Impaired fasting glucose: Secondary | ICD-10-CM | POA: Insufficient documentation

## 2021-09-18 DIAGNOSIS — R7303 Prediabetes: Secondary | ICD-10-CM | POA: Diagnosis not present

## 2021-09-18 NOTE — Patient Instructions (Addendum)
Consider finding a beginners Yoga class for strength 2x/week Or other strength training Be mindful with snacks Aim for at least 20 grams of protein with each meal

## 2021-09-18 NOTE — Progress Notes (Signed)
Medical Nutrition Therapy  Appointment Start time:  1635   Appointment End time:  1705  Primary concerns today: weight loss Referral diagnosis: R73 (high cholesterol) Preferred learning style: no preference indicated Learning readiness: ready, change in progress   NUTRITION ASSESSMENT   Anthropometrics  Wt Readings from Last 3 Encounters:  09/02/21 159 lb 12.8 oz (72.5 kg)  08/21/21 160 lb 3.2 oz (72.7 kg)  07/08/21 160 lb 6.4 oz (72.8 kg)    Wt Readings from Last 3 Encounters:  06/06/21 161 lb 3.2 oz (73.1 kg)  03/14/21 163 lb (73.9 kg)  02/28/21 164 lb 9.6 oz (74.7 kg)    Clinical Medical Hx: reviewed, familial hypercholesteremia Medications: reviewed Labs: A1c 6.3%, fasting 96 mg/dL; was 6.6% in Oct. Notable Signs/Symptoms: fatigue, weakness.  Lifestyle & Dietary Hx  Physical Activity: since last visit has started walking consistently for 5 weeks. Patient has not started strength exercises. Pt states she has a friend who is a yoga teacher and plans to contact her.  Diet: Pt states she eats a lot of salads.Pt reports she has a 4-yr old foster child and is motivated to cook at home to avoid taking this child to fast food.  Estimated daily fluid intake: 80+ oz water (not assessed this visit) Supplements: MVI gummy, vit C gummy (states hard to swallow pills) Sleep: 5-6 hrs per night. Stress / self-care: stress has increased some now that she has two young foster care children Current average weekly physical activity: 2 miles 4x/week with teammates at work, 1 mile at home at least 5x/week  24-Hr Dietary Recall First Meal: okios yogurt 15 g pro and 1/2 apple, almonds, a little granola (no cranberries) OR small pancake, maple syrup, 1/2 fruit OR 2 boiled eggs Snack: none Second Meal: salad mix, a little meat,  OR spinach Snack: nuts or fruit Third Meal: (7-8 pm) brown rice, couscous or quinoa, baked chicken, vegetable. Snack: nuts OR squares of cheese Beverages: water, 1/2  c small lemonade, 100% white grape juice 1/2 c, no ginger ale, herbal tea 1 packet of sugar   NUTRITION DIAGNOSIS  NB-1.1 Food and nutrition-related knowledge deficit As related to balanced eating and using body cues for portion sizes.  As evidenced by patient feeling weak from taking in enough nutrition, and may not be taking in enough fiber.   NUTRITION INTERVENTION  Nutrition education (E-1) on the following topics:  Non-hunger eating Protein Strength training  Handouts Provided Include  none  Learning Style & Readiness for Change Teaching method utilized: Visual & Auditory  Demonstrated degree of understanding via: Teach Back  Barriers to learning/adherence to lifestyle change: none  Goals Established by Pt Consider finding a beginners Yoga class for strength 2x/week, or other strength training Be mindful with snacks Aim for at least 20 grams of protein with each meal  MONITORING & EVALUATION Dietary intake, weekly physical activity, weight and energy level in 2 months. 

## 2021-09-20 LAB — COLOGUARD
COLOGUARD: NEGATIVE
Cologuard: NEGATIVE

## 2021-10-10 ENCOUNTER — Other Ambulatory Visit: Payer: Self-pay | Admitting: Cardiology

## 2021-10-10 DIAGNOSIS — I10 Essential (primary) hypertension: Secondary | ICD-10-CM

## 2021-10-14 ENCOUNTER — Telehealth: Payer: Self-pay | Admitting: Medical

## 2021-10-14 NOTE — Telephone Encounter (Signed)
Your Cologuard screening for colon cancer was negative.  This indicates a lower likelihood that colorectal cancer is present.   Lets plan to repeat this in 3 years.  However, if you develop bowel changes, blood in stool, unexpected weight loss, or other new bowel changes, then recheck.

## 2021-10-15 ENCOUNTER — Encounter: Payer: Self-pay | Admitting: Internal Medicine

## 2021-10-15 NOTE — Telephone Encounter (Signed)
Pt was notified.  

## 2021-10-21 ENCOUNTER — Other Ambulatory Visit: Payer: Self-pay

## 2021-10-21 ENCOUNTER — Other Ambulatory Visit: Payer: Self-pay | Admitting: Cardiology

## 2021-10-21 DIAGNOSIS — I10 Essential (primary) hypertension: Secondary | ICD-10-CM

## 2021-10-21 MED ORDER — AMLODIPINE BESYLATE 5 MG PO TABS: 5.0000 mg | ORAL_TABLET | Freq: Two times a day (BID) | ORAL | 0 refills | Status: DC

## 2021-10-24 ENCOUNTER — Ambulatory Visit: Payer: BC Managed Care – PPO | Admitting: Podiatry

## 2021-10-30 ENCOUNTER — Encounter: Payer: Self-pay | Admitting: Internal Medicine

## 2021-11-04 ENCOUNTER — Telehealth: Payer: Self-pay | Admitting: Medical

## 2021-11-04 NOTE — Telephone Encounter (Signed)
Parameds medical records request sent to [facility].

## 2021-11-14 ENCOUNTER — Ambulatory Visit: Payer: BC Managed Care – PPO | Admitting: Cardiology

## 2021-11-14 ENCOUNTER — Encounter: Payer: Self-pay | Admitting: Cardiology

## 2021-11-14 VITALS — BP 131/84 | HR 77 | Temp 98.0°F | Resp 11 | Ht 64.0 in | Wt 165.0 lb

## 2021-11-14 DIAGNOSIS — E782 Mixed hyperlipidemia: Secondary | ICD-10-CM

## 2021-11-14 DIAGNOSIS — I2510 Atherosclerotic heart disease of native coronary artery without angina pectoris: Secondary | ICD-10-CM

## 2021-11-14 DIAGNOSIS — R002 Palpitations: Secondary | ICD-10-CM

## 2021-11-14 DIAGNOSIS — I10 Essential (primary) hypertension: Secondary | ICD-10-CM

## 2021-11-14 DIAGNOSIS — I340 Nonrheumatic mitral (valve) insufficiency: Secondary | ICD-10-CM

## 2021-11-14 DIAGNOSIS — Z8249 Family history of ischemic heart disease and other diseases of the circulatory system: Secondary | ICD-10-CM

## 2021-11-14 NOTE — Progress Notes (Signed)
ID:  Debbie [name], DOB 1971/01/31, MRN [id number]  PCP:  Carlena Hurl, PA-C  Cardiologist:  Rex Kras, DO, [facility] (established care 10/03/2019) Former Cardiology Providers: Dr. Glenetta Hew.   Date: 11/14/21 Last Office Visit: 08/13/2020  Chief Complaint  Patient presents with   Palpitations    HPI  Debbie [name] is a 51 y.o. 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   Patient was referred to the practice for evaluation and management of hypertension and family history of premature CAD.  Given her symptoms of palpitations she did undergo cardiac monitor and we discussed initiating AV nodal blocking agents.  However, patient stated that symptoms are well controlled and she would like to hold off on medical therapy. Since last office visit patient states that she continues to have palpitations daily but they are definitely less intense, less frequent, and shorter in duration.  She still feels the same as she would like to monitor it compared to being on medical therapy.  With regards to her family history of premature CAD I have encouraged her to work on improving her modifiable cardiovascular risk factors including blood pressure, lipids. Her last ischemic workup was in 2021.   Since last office visit patient had an echocardiogram to reevaluate the progression of mitral regurgitation.  Echo from August 2022 illustrates improvement in the severity of MR.   FUNCTIONAL STATUS: Stationary bike every day for 5 mile in 20 minutes.  ALLERGIES: Allergies  Allergen Reactions   Covid-19 (Mrna) Vaccine Anaphylaxis   Covid-19 (Adenovirus) Vaccine Other (See Comments)    High BP, pulses elevated, mild tongue swelling, mild dyspnea   Beta Adrenergic Blockers      Spasm, inorgasmic, not willing to do beta blocker   Contrast Media [Iodinated Contrast Media]     For CT Angio, breaks out in hives   Flexeril [Cyclobenzaprine]    Lipitor [Atorvastatin Calcium] Other (See Comments)    Pt states she had a bad reaction several years ago when taking this   Paxil [Paroxetine Hcl]     Tongue swelling had trouble swallowing    Sulfa Antibiotics Other (See Comments)    Unknown reaction    MEDICATION LIST PRIOR TO VISIT: No outpatient medications have been marked as taking for the 11/14/21 encounter (Office Visit) with Terri Skains, Donnivan Villena, DO.     PAST MEDICAL HISTORY: Past Medical History:  Diagnosis Date   Anemia    history of   Aneurysm of carotid artery (HCC)    hx/o - coiling 02/07/2010   Chronic headache    since aneurysm diagnosis. mostly related to constipation as of 2017   Coronary artery calcification    Edema    Familial hypercholesteremia    LDL over 300; lipoprotein testing 01/2009   Family history of heart disease in female family member before age 77    LDL over 56 prior   H/O cardiovascular stress test 12/08/05   fair exercise capacity, no ST segment changes suggestive of ischemia   H/O echocardiogram 03/22/01   mild asymmetric LVH, mild aortic sclerosis, mild mitral regurgitation   History of blood transfusion    x2 due to heavy bleeding and anemia from fibroids   Hx of CT scan of chest 09/2016   Coronary calcium score of 15. This was 24 percentile for age  and sex   Hypertension    Prediabetes    S/P partial hysterectomy    due to uterine fibroids   Vertebral artery aneurysm (HCC)    left, unruptured, no surgery correction, monitoring since 2011 (followed by Dr. Nigel Sloop)   Vitamin D deficiency    Wears glasses     PAST SURGICAL HISTORY: Past Surgical History:  Procedure Laterality Date   ABDOMINAL HYSTERECTOMY     ANEURYSM COILING  02/07/10   stent assisted coiling of right internal carotid artery aneurysm in periophthalmic  region   CESAREAN SECTION     COLONOSCOPY  2011   Dr. Loreta Ave, anemia workup   ESOPHAGOGASTRODUODENOSCOPY  2011   Dr. Loreta Ave   HERNIA REPAIR     epigastric hernia repair age 40, right inguinal hernia as a child   PARTIAL HYSTERECTOMY     abdominal due to fibroids, still has ovaries, Dr. Layne Benton   TUBAL LIGATION     tubal reversal  2008    FAMILY HISTORY: The patient family history includes Aneurysm in her sister; Breast cancer in her mother; Cancer in her maternal grandmother, mother, paternal grandfather, and another family member; Diabetes in her father; Heart disease (age of onset: 40) in her sister; Heart disease (age of onset: 28) in her father; Heart failure in her sister and another family member; Hyperlipidemia in her father; Hypertension in her mother and sister; Obesity in her mother; Stroke in her maternal grandfather.  SOCIAL HISTORY:  The patient  reports that she has never smoked. She has never used smokeless tobacco. She reports that she does not drink alcohol and does not use drugs.  REVIEW OF SYSTEMS: Review of Systems  Cardiovascular:  Positive for palpitations. Negative for chest pain, claudication, dyspnea on exertion, irregular heartbeat, leg swelling, near-syncope, orthopnea, paroxysmal nocturnal dyspnea and syncope.  Respiratory:  Negative for shortness of breath.   Hematologic/Lymphatic: Negative for bleeding problem.  Musculoskeletal:  Negative for muscle cramps and myalgias.  Neurological:  Negative for dizziness and light-headedness.   PHYSICAL EXAM:    11/14/2021    4:46 PM 09/02/2021    9:35 AM 08/21/2021   10:07 AM  Vitals with BMI  Height 5\' 4"     Weight 165 lbs 159 lbs 13 oz 160 lbs 3 oz  BMI 28.31    Systolic 131 120  Diastolic 84 70 70  Pulse 77 67 84   Physical Exam  Constitutional: No distress.  Age appropriate, hemodynamically stable.   Neck: No JVD present.  Cardiovascular: Normal rate, regular rhythm, S1 normal, S2 normal, intact distal  pulses and normal pulses. Exam reveals no gallop, no S3 and no S4.  No murmur heard. Pulmonary/Chest: Effort normal and breath sounds normal. No stridor. She has no wheezes. She has no rales.  Abdominal: Soft. Bowel sounds are normal. She exhibits no distension. There is no abdominal tenderness.  Musculoskeletal:        General: No edema.     Cervical back: Neck supple.  Neurological: She is alert and oriented to person, place, and time. She has intact cranial nerves (2-12).  Skin: Skin is warm and moist.   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   CARDIAC DATABASE: EKG: 06/22/2020: Normal sinus rhythm, 90 bpm, normal axis, without underlying ischemia or injury pattern.   Echocardiogram: 10/02/2020: Normal LV systolic function with visual EF 60-65%. Left ventricle cavity is normal  in size. Normal left ventricular wall thickness. Normal global wall motion. Normal diastolic filling pattern, normal LAP. Mild (Grade I) mitral regurgitation. Mild tricuspid regurgitation. No evidence of pulmonary hypertension. Compared to study 10/18/2019: Moderate MR is now mild otherwise no significant change.  Stress Testing: Exercise Sestamibi stress test 12/05/2019: Exercise nuclear stress test was performed using Debbie protocol. Patient reached 8.6 METS, and 99% of age predicted maximum heart rate. Exercise capacity was fair. Chest pain not reported. Heart rate and hemodynamic response were normal. Stress EKG revealed no ischemic changes. Normal myocardial perfusion. Stress LVEF 51%. Low risk study.   CT Cardiac Scoring: 10/03/2016: Coronary calcium score of 15. This was 45 percentile for age and sex matched control.  Heart Catheterization: None  14 day extended Holter monitor: Dominant rhythm normal sinus rhythm. Heart rate 29-261 bpm.  Avg HR 83 bpm. No atrial  fibrillation,high grade AV block, pauses (3 seconds or longer). 1 episode of NSVT, 6 beats in duration, 1.8 seconds, average heart rate 208 bpm. Total ventricular ectopic burden <1%. Total supraventricular ectopic burden <1% Patient triggered events: 13.  Underlying rhythm normal sinus with ventricular ectopy.  LABORATORY DATA:    Latest Ref Rng & Units 02/28/2021   11:41 AM 12/05/2020    9:00 AM 06/20/2020   11:40 PM  CBC  WBC 3.4 - 10.8 x10E3/uL 6.9  7.4  7.9   Hemoglobin 11.1 - 15.9 g/dL 73.5  32.9  92.4   Hematocrit 34.0 - 46.6 % 41.5  41.2  41.3   Platelets 150 - 450 x10E3/uL 391  347  335        Latest Ref Rng & Units 03/14/2021    1:30 PM 02/28/2021   11:41 AM 12/05/2020    9:00 AM  CMP  Glucose 70 - 99 mg/dL 96  93  268   BUN 6 - 24 mg/dL  8  10   Creatinine 3.41 - 1.00 mg/dL  9.62  2.29   Sodium 798 - 144 mmol/L  139  145   Potassium 3.5 - 5.2 mmol/L  4.1  4.5   Chloride 96 - 106 mmol/L  103  106   CO2 20 - 29 mmol/L  24  25   Calcium 8.7 - 10.2 mg/dL  9.4  9.9   Total Protein 6.0 - 8.5 g/dL   7.5   Total Bilirubin 0.0 - 1.2 mg/dL   0.4   Alkaline Phos 44 - 121 IU/L   96   AST 0 - 40 IU/L   18   ALT 0 - 32 IU/L   19     Lipid Panel  Lab Results  Component Value Date   CHOL 189 03/14/2021   HDL 44 03/14/2021   LDLCALC 135 (H) 03/14/2021   TRIG 55 03/14/2021   CHOLHDL 4.3 03/14/2021   BMP Recent Labs    12/05/20 0900 02/28/21 1141 03/14/21 1330  NA 145* 139  --   K 4.5 4.1  --   CL 106 103  --   CO2 25 24  --   GLUCOSE 101* 93 96  BUN 10 8  --   CREATININE 0.89 0.80  --   CALCIUM 9.9 9.4  --     HEMOGLOBIN A1C Lab Results  Component Value Date   HGBA1C 6.3 (H) 03/14/2021   MPG 123 04/18/2016    IMPRESSION:    ICD-10-CM   1. Palpitations  R00.2     2. Family history of premature CAD  Z57.49  3. Coronary atherosclerosis due to calcified coronary lesion of native artery  I25.10    I25.84     4. Essential hypertension  I10     5.  Mixed dyslipidemia  E78.2     6. Nonrheumatic mitral valve regurgitation  I34.0        RECOMMENDATIONS: Debbie [name] is a 51 y.o. 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   Palpitations Chronic and stable. Cardiac monitor performed in the past and results reviewed. Overall intensity, frequency, and duration has improved compared to the past & she would like to continue with conservative management. If needed could consider low-dose Cardizem on a as needed basis.  Family history of premature CAD Reviewed her last ischemic work-up as outlined above. Currently on aspirin, statin therapy, and Zetia. Reemphasized importance of improving her modifiable cardiovascular risk factors.  Essential hypertension Office blood pressures are well controlled. No changes warranted at this time. Currently managed by primary care provider.  Mixed dyslipidemia Currently being managed by PCP  Nonrheumatic mitral valve regurgitation Echocardiogram since last office visit notes improvement in the severity of MR. Monitor for now.  Patient is currently on full dose aspirin since 2011 after addressing her aneurysm.  Currently being managed by neurosurgery.   FINAL MEDICATION LIST END OF ENCOUNTER: No orders of the defined types were placed in this encounter.    Current Outpatient Medications:    amLODipine (NORVASC) 5 MG tablet, TAKE 1 TABLET BY MOUTH IN THE MORNING AND IN THE EVENING, Disp: 180 tablet, Rfl: 0   aspirin ([employer] ASPIRIN EC) 325 MG EC tablet, Take 1 tablet (325 mg total) by mouth daily., Disp: 90 tablet, Rfl: 3   Cholecalciferol (VITAMIN D3) 50 MCG (2000 UT) CAPS, Take 5,000 Units by mouth daily., Disp: , Rfl:    CRESTOR 20 MG tablet, TAKE 1 TABLET BY MOUTH EVERYDAY AT  BEDTIME, Disp: 90 tablet, Rfl: 1   ezetimibe (ZETIA) 10 MG tablet, Take 10 mg by mouth daily., Disp: , Rfl:    meloxicam (MOBIC) 15 MG tablet, Take 1 tablet (15 mg total) by mouth daily., Disp: 30 tablet, Rfl: 3   methylPREDNISolone (MEDROL DOSEPAK) 4 MG TBPK tablet, 6 day dose pack - take as directed, Disp: 21 tablet, Rfl: 0  No orders of the defined types were placed in this encounter.  --Continue cardiac medications as reconciled in final medication list. --Return in about 1 year (around 11/15/2022) for Annual follow up visit. Or sooner if needed. --Continue follow-up with your primary care physician regarding the management of your other chronic comorbid conditions.  Patient's questions and concerns were addressed to her satisfaction. She voices understanding of the instructions provided during this encounter.   This note was created using a voice recognition software as a result there may be grammatical errors inadvertently enclosed that do not reflect the nature of this encounter. Every attempt is made to correct such errors.  Tessa LernerSunit Pascal Stiggers, [place]DO, [facility]  Pager: [phone] Office: [phone]

## 2021-11-23 ENCOUNTER — Other Ambulatory Visit: Payer: Self-pay | Admitting: Medical

## 2021-11-25 ENCOUNTER — Ambulatory Visit: Payer: BC Managed Care – PPO | Admitting: Registered"

## 2021-11-25 NOTE — Progress Notes (Deleted)
Medical Nutrition Therapy  Appointment Start time:  [id number]   Appointment End time:  1705  Primary concerns today: weight loss Referral diagnosis: R73 (high cholesterol) Preferred learning style: no preference indicated Learning readiness: ready, change in progress   NUTRITION ASSESSMENT   Anthropometrics  Wt Readings from Last 3 Encounters:  09/02/21 159 lb 12.8 oz (72.5 kg)  08/21/21 160 lb 3.2 oz (72.7 kg)  07/08/21 160 lb 6.4 oz (72.8 kg)    Wt Readings from Last 3 Encounters:  06/06/21 161 lb 3.2 oz (73.1 kg)  03/14/21 163 lb (73.9 kg)  02/28/21 164 lb 9.6 oz (74.7 kg)    Clinical Medical Hx: reviewed, familial hypercholesteremia Medications: reviewed Labs: A1c 6.3%, fasting 96 mg/dL; was 6.6% in Oct. Notable Signs/Symptoms: fatigue, weakness.  Lifestyle & Dietary Hx  Physical Activity: since last visit has started walking consistently for 5 weeks. Patient has not started strength exercises. Pt states she has a friend who is a Risk manager and plans to contact her.  Diet: Pt states she eats a lot of salads.Pt reports she has a 4-yr old foster child and is motivated to cook at home to avoid taking this child to fast food.  Estimated daily fluid intake: 80+ oz water (not assessed this visit) Supplements: MVI gummy, vit C gummy (states hard to swallow pills) Sleep: 5-6 hrs per night. Stress / self-care: stress has increased some now that she has two young foster care children Current average weekly physical activity: 2 miles 4x/week with teammates at work, 1 mile at home at least 5x/week  24-Hr Dietary Recall First Meal: okios yogurt 15 g pro and 1/2 apple, almonds, a little granola (no cranberries) OR small pancake, maple syrup, 1/2 fruit OR 2 boiled eggs Snack: none Second Meal: salad mix, a little meat,  OR spinach Snack: nuts or fruit Third Meal: (7-8 pm) brown rice, couscous or quinoa, baked chicken, vegetable. Snack: nuts OR squares of cheese Beverages: water, 1/2  c small lemonade, 100% white grape juice 1/2 c, no ginger ale, herbal tea 1 packet of sugar   NUTRITION DIAGNOSIS  NB-1.1 Food and nutrition-related knowledge deficit As related to balanced eating and using body cues for portion sizes.  As evidenced by patient feeling weak from taking in enough nutrition, and may not be taking in enough fiber.   NUTRITION INTERVENTION  Nutrition education (E-1) on the following topics:  Non-hunger eating Protein Strength training  Handouts Provided Include  none  Learning Style & Readiness for Change Teaching method utilized: Visual & Auditory  Demonstrated degree of understanding via: Teach Back  Barriers to learning/adherence to lifestyle change: none  Goals Established by Pt Consider finding a beginners Yoga class for strength 2x/week, or other strength training Be mindful with snacks Aim for at least 20 grams of protein with each meal  MONITORING & EVALUATION Dietary intake, weekly physical activity, weight and energy level in 2 months.

## 2021-12-03 ENCOUNTER — Encounter: Payer: Self-pay | Admitting: Podiatry

## 2021-12-03 ENCOUNTER — Ambulatory Visit: Payer: BC Managed Care – PPO | Admitting: Podiatry

## 2021-12-03 DIAGNOSIS — M722 Plantar fascial fibromatosis: Secondary | ICD-10-CM

## 2021-12-03 DIAGNOSIS — M7752 Other enthesopathy of left foot: Secondary | ICD-10-CM | POA: Diagnosis not present

## 2021-12-03 MED ORDER — DEXAMETHASONE SODIUM PHOSPHATE 120 MG/30ML IJ SOLN
2.0000 mg | Freq: Once | INTRAMUSCULAR | Status: AC
  Administered 2021-12-03: 2 mg via INTRA_ARTICULAR

## 2021-12-03 NOTE — Progress Notes (Signed)
She states that the pain in the left heel is gone but the pain around the outside of the foot around the fifth metatarsal base is still present.  She is also concerned about a hallux nail left foot that breaks all.  She states that she has had meds for it in the past and she thinks that it was fungus.  Objective: Vital signs stable alert oriented x3.  Pulses are palpable.  Hallux nail left does demonstrate what appears to be onychomycosis but would like for the nail to grow out and we will sample of the next time she comes in.  She still has pain on palpation of the fifth metatarsal base of the left foot with mild fluctuance no erythema cellulitis drainage or odor.  Completely has resolution of any pain to the plantar medial and plantar central calcaneal tubercles.  Assessment: Well-healing plan fasciitis still retains some bursitis fifth metatarsal base left foot.  And nail dystrophy hallux left.  Plan: Injected dexamethasone local anesthetic to the fifth metatarsal base of the left foot.  Recommended that she allow the nail to grow out so that we can sample it.  Follow-up with her in about 2 months

## 2021-12-04 ENCOUNTER — Encounter (HOSPITAL_COMMUNITY): Payer: Self-pay | Admitting: Emergency Medicine

## 2021-12-04 ENCOUNTER — Emergency Department (HOSPITAL_COMMUNITY): Payer: BC Managed Care – PPO

## 2021-12-04 ENCOUNTER — Emergency Department (HOSPITAL_COMMUNITY)
Admission: EM | Admit: 2021-12-04 | Discharge: 2021-12-05 | Disposition: A | Discharge status: Home / Self Care | Payer: BC Managed Care – PPO | Attending: Emergency Medicine | Admitting: Emergency Medicine

## 2021-12-04 ENCOUNTER — Other Ambulatory Visit: Payer: Self-pay

## 2021-12-04 DIAGNOSIS — R0789 Other chest pain: Secondary | ICD-10-CM | POA: Diagnosis present

## 2021-12-04 DIAGNOSIS — Z79899 Other long term (current) drug therapy: Secondary | ICD-10-CM | POA: Insufficient documentation

## 2021-12-04 DIAGNOSIS — I10 Essential (primary) hypertension: Secondary | ICD-10-CM | POA: Insufficient documentation

## 2021-12-04 DIAGNOSIS — Z7982 Long term (current) use of aspirin: Secondary | ICD-10-CM | POA: Diagnosis not present

## 2021-12-04 DIAGNOSIS — E876 Hypokalemia: Secondary | ICD-10-CM | POA: Diagnosis not present

## 2021-12-04 LAB — CBC WITH DIFFERENTIAL/PLATELET
Abs Immature Granulocytes: 0.02 10*3/uL (ref 0.00–0.07)
Basophils Absolute: 0.1 10*3/uL (ref 0.0–0.1)
Basophils Relative: 1 %
Eosinophils Absolute: 0.1 10*3/uL (ref 0.0–0.5)
Eosinophils Relative: 1 %
HCT: 42.5 % (ref 36.0–46.0)
Hemoglobin: 13.8 g/dL (ref 12.0–15.0)
Immature Granulocytes: 0 %
Lymphocytes Relative: 44 %
Lymphs Abs: 4.1 10*3/uL — ABNORMAL HIGH (ref 0.7–4.0)
MCH: 29 pg (ref 26.0–34.0)
MCHC: 32.5 g/dL (ref 30.0–36.0)
MCV: 89.3 fL (ref 80.0–100.0)
Monocytes Absolute: 0.6 10*3/uL (ref 0.1–1.0)
Monocytes Relative: 6 %
Neutro Abs: 4.5 10*3/uL (ref 1.7–7.7)
Neutrophils Relative %: 48 %
Platelets: 340 10*3/uL (ref 150–400)
RBC: 4.76 MIL/uL (ref 3.87–5.11)
RDW: 12.8 % (ref 11.5–15.5)
WBC: 9.4 10*3/uL (ref 4.0–10.5)
nRBC: 0 % (ref 0.0–0.2)

## 2021-12-04 NOTE — ED Provider Triage Note (Signed)
Emergency Medicine Provider Triage Evaluation Note  Debbie [name] , a 51 y.o. female  was evaluated in triage.  Pt complains of chest discomfort that began around 730 tonight. Described as generalized pressure/fullness that developed after hearing new her children's father was in the ED for chest pain. Sxs fairly resolved now. Has some palpitations which is not atypical for her, these are chronic since receiving a vaccine.  Denies nausea, vomiting, or diaphoresis.  Review of Systems  Per above.   Physical Exam  BP (!) 170/85 (BP Location: Right Arm)   Pulse 86   Temp 98 F (36.7 C)   Resp 17   SpO2 100%  Gen:   Awake, no distress   Resp:  Normal effort  MSK:   Moves extremities without difficulty  Other:  Heart RRR. 2+ radial pulses.   Medical Decision Making  Medically screening exam initiated at 10:46 PM.  Appropriate orders placed.  LAKEITHIA RASOR was informed that the remainder of the evaluation will be completed by another provider, this initial triage assessment does not replace that evaluation, and the importance of remaining in the ED until their evaluation is complete.  Chest pain   Amaryllis Dyke, PA-C 12/04/21 2248

## 2021-12-04 NOTE — ED Triage Notes (Signed)
Pt reported to ED with c/o chest pressure and fullness that started at approximately 7pm this evening.

## 2021-12-05 LAB — COMPREHENSIVE METABOLIC PANEL
ALT: 21 U/L (ref 0–44)
AST: 17 U/L (ref 15–41)
Albumin: 4 g/dL (ref 3.5–5.0)
Alkaline Phosphatase: 66 U/L (ref 38–126)
Anion gap: 8 (ref 5–15)
BUN: 11 mg/dL (ref 6–20)
CO2: 27 mmol/L (ref 22–32)
Calcium: 9.3 mg/dL (ref 8.9–10.3)
Chloride: 105 mmol/L (ref 98–111)
Creatinine, Ser: 0.78 mg/dL (ref 0.44–1.00)
GFR, Estimated: >60 mL/min (ref >60)
Glucose, Bld: 106 mg/dL — ABNORMAL HIGH (ref 70–99)
Potassium: 3.4 mmol/L — ABNORMAL LOW (ref 3.5–5.1)
Sodium: 140 mmol/L (ref 135–145)
Total Bilirubin: 0.8 mg/dL (ref 0.3–1.2)
Total Protein: 7.8 g/dL (ref 6.5–8.1)

## 2021-12-05 LAB — TROPONIN I (HIGH SENSITIVITY)
Troponin I (High Sensitivity): 4 ng/L (ref <18)
Troponin I (High Sensitivity): 4 ng/L (ref <18)

## 2021-12-05 NOTE — Discharge Instructions (Addendum)
Evaluation for your chest fullness was overall reassuring. EKG was normal and cardiac enzymes were also within normal limits.  If you have worsening chest pain, new shortness of breath, new calf tenderness please return to the emergency department for further evaluation.  Otherwise, recommend that you follow-up with your PCP regarding recent chest fullness in the setting of a major stressor regarding a family member who had a recent heart attack.

## 2021-12-05 NOTE — ED Provider Notes (Signed)
[facility] EMERGENCY DEPARTMENT Provider Note   CSN: [id number] Arrival date & time: 12/04/21  2039     History  Chief Complaint  Patient presents with   Chest Pain    HPI PRISTINE GLADHILL is a 51 y.o. female with history of cerebral aneurysm, hypertension, and dyslipidemia presenting for chest fullness.  She characterizes her pain as "chest fullness" and denies chest pain.  Started around 745 last night just after she received a call that the birth father of her children had had a heart attack.  She immediately started to feel "chest fullness".  Located at the top midportion of her chest.  Nonradiating.  The chest fullness eased up after she learned he was going to be discharged.  She took her daily aspirin and amlodipine but unsure if it made a difference.  States that her hypertension is well controlled on amlodipine.  Denies shortness of breath.   Chest Pain      Home Medications Prior to Admission medications   Medication Sig Start Date End Date Taking? Authorizing Provider  amLODipine (NORVASC) 5 MG tablet TAKE 1 TABLET BY MOUTH IN THE MORNING AND IN THE EVENING 10/21/21   Tolia, Sunit, DO  aspirin ([employer] ASPIRIN EC) 325 MG EC tablet Take 1 tablet (325 mg total) by mouth daily. 03/15/21   Tysinger, Camelia Eng, PA-C  Cholecalciferol (VITAMIN D3) 50 MCG (2000 UT) CAPS Take 5,000 Units by mouth daily.    [provider]  CRESTOR 20 MG tablet TAKE 1 TABLET BY MOUTH EVERYDAY AT BEDTIME 06/27/21   Tysinger, Camelia Eng, PA-C  ezetimibe (ZETIA) 10 MG tablet TAKE 1 TABLET BY MOUTH EVERY DAY 11/25/21   Tysinger, Camelia Eng, PA-C  meloxicam (MOBIC) 15 MG tablet Take 1 tablet (15 mg total) by mouth daily. 09/16/21   Hyatt, Max T, DPM  omeprazole (PRILOSEC) 40 MG capsule Take 40 mg by mouth daily. 09/17/21   [provider]      Allergies    Covid-19 (mrna) vaccine, Covid-19 (adenovirus) vaccine, Beta adrenergic blockers, Contrast media [iodinated contrast media],  Flexeril [cyclobenzaprine], Lipitor [atorvastatin calcium], Paxil [paroxetine hcl], and Sulfa antibiotics    Review of Systems   Review of Systems  Cardiovascular:  Positive for chest pain.    Physical Exam Updated Vital Signs BP (!) 113/50   Pulse 72   Temp 98.2 F (36.8 C) (Oral)   Resp 12   SpO2 100%  Physical Exam Vitals and nursing note reviewed.  HENT:     Head: Normocephalic and atraumatic.     Mouth/Throat:     Mouth: Mucous membranes are moist.  Eyes:     General:        Right eye: No discharge.        Left eye: No discharge.     Conjunctiva/sclera: Conjunctivae normal.  Cardiovascular:     Rate and Rhythm: Normal rate and regular rhythm.     Pulses: Normal pulses.     Heart sounds: Normal heart sounds.  Pulmonary:     Effort: Pulmonary effort is normal.     Breath sounds: Normal breath sounds.  Abdominal:     General: Abdomen is flat.     Palpations: Abdomen is soft.  Musculoskeletal:     Comments: No lower extremity edema noted.  Skin:    General: Skin is warm and dry.  Neurological:     General: No focal deficit present.  Psychiatric:        Mood and  Affect: Mood normal.     ED Results / Procedures / Treatments   Labs (all labs ordered are listed, but only abnormal results are displayed) Labs Reviewed  COMPREHENSIVE METABOLIC PANEL - Abnormal; Notable for the following components:      Result Value   Potassium 3.4 (*)    Glucose, Bld 106 (*)    All other components within normal limits  CBC WITH DIFFERENTIAL/PLATELET - Abnormal; Notable for the following components:   Lymphs Abs 4.1 (*)    All other components within normal limits  TROPONIN I (HIGH SENSITIVITY)  TROPONIN I (HIGH SENSITIVITY)    EKG EKG Interpretation  Date/Time:  Wednesday December 04 2021 22:33:23 EDT Ventricular Rate:  90 PR Interval:  180 QRS Duration: 74 QT Interval:  322 QTC Calculation: 393 R Axis:   92 Text Interpretation: Normal sinus rhythm Rightward axis  Borderline ECG When compared with ECG of 20-Jun-2020 23:34, Rightward axis is now present Confirmed by Dione Booze ([id number]) on 12/05/2021 4:06:33 AM  Radiology DG Chest 2 View  Result Date: 12/04/2021 CLINICAL DATA:  Tightness in the chest. EXAM: CHEST - 2 VIEW COMPARISON:  03/01/2021 FINDINGS: The heart size and mediastinal contours are within normal limits. Both lungs are clear. The visualized skeletal structures are unremarkable. IMPRESSION: No active cardiopulmonary disease. Electronically Signed   By: Burman Nieves M.D.   On: 12/04/2021 23:16    Procedures Procedures    Medications Ordered in ED Medications - No data to display  ED Course/ Medical Decision Making/ A&P                           Medical Decision Making  This patient presents to the ED for concern of chest fullness, this involves a number of treatment options, and is a complaint that carries with it a high risk of complications and morbidity.  The differential diagnosis includes ACS, PE, CHF exacerbation, and aortic dissection.   Co morbidities: Discussed in HPI    EMR reviewed including pt PMHx, past surgical history and past visits to ER.   See HPI for more details   Lab Tests:   I ordered and independently interpreted labs. Labs notable for mildly hypokalemic   Imaging Studies:  NAD. I personally reviewed all imaging studies and no acute abnormality found. I agree with radiology interpretation.    Cardiac Monitoring:  The patient was maintained on a cardiac monitor.  I personally viewed and interpreted the cardiac monitored which showed an underlying rhythm of: NSR EKG non-ischemic   Medicines ordered:  No medications given at this time. Reevaluation of the patient after these medicines showed that the patient improved.  Upon reevaluation patient stated that her chest fullness has completely subsided and that she was asymptomatic. I have reviewed the patients home medicines and have made  adjustments as needed    Consults/Attending Physician   I discussed this case with my attending physician who cosigned this note including patient's presenting symptoms, physical exam, and planned diagnostics and interventions. Attending physician stated agreement with plan or made changes to plan which were implemented.   Reevaluation:  After the interventions noted above I re-evaluated patient and found that they have :improved   Problem List / ED Course: Presented to the emergency department for chest fullness.  Her symptoms began immediately after she learned of a close family member having a heart attack.  Symptoms also subsided after she learned that he was going to be  okay discharge.  Considered ACS but unlikely given normal EKG, normal troponins, and patient denying chest pain shortness of breath, and heart score is 2 making the risk of MACE less than 1%.  Considered PE but unlikely given patient is not tachycardic and not short of breath or hypoxic, and patient did not endorse any risk factors.  Considered CHF exacerbation but unlikely given clear lungs on x-ray.  Discussed return precautions and advised patient to follow-up with her PCP regarding recent occurrence of chest fullness.   Dispostion:  After consideration of the diagnostic results and the patients response to treatment, I feel that the patient would benefit from discharge and follow-up with PCP regarding recent occurrence of chest fullness in setting of major stressor regarding her family member who had a heart attack.         Final Clinical Impression(s) / ED Diagnoses Final diagnoses:  Chest fullness    Rx / DC Orders ED Discharge Orders     None         Gareth Eagle, PA-C 12/05/21 [id number]    Dione Booze, MD 12/05/21 [phone]

## 2021-12-06 ENCOUNTER — Ambulatory Visit: Payer: BC Managed Care – PPO | Admitting: Medical

## 2021-12-06 ENCOUNTER — Telehealth: Payer: Self-pay | Admitting: Medical

## 2021-12-06 ENCOUNTER — Encounter: Payer: Self-pay | Admitting: Medical

## 2021-12-06 VITALS — BP 134/82 | HR 81 | Temp 98.3°F | Ht 65.0 in | Wt 161.6 lb

## 2021-12-06 DIAGNOSIS — Z9889 Other specified postprocedural states: Secondary | ICD-10-CM

## 2021-12-06 DIAGNOSIS — Z8249 Family history of ischemic heart disease and other diseases of the circulatory system: Secondary | ICD-10-CM

## 2021-12-06 DIAGNOSIS — Z1231 Encounter for screening mammogram for malignant neoplasm of breast: Secondary | ICD-10-CM | POA: Diagnosis not present

## 2021-12-06 DIAGNOSIS — Z2821 Immunization not carried out because of patient refusal: Secondary | ICD-10-CM

## 2021-12-06 DIAGNOSIS — E7801 Familial hypercholesterolemia: Secondary | ICD-10-CM

## 2021-12-06 DIAGNOSIS — Z8679 Personal history of other diseases of the circulatory system: Secondary | ICD-10-CM

## 2021-12-06 DIAGNOSIS — Z90710 Acquired absence of both cervix and uterus: Secondary | ICD-10-CM

## 2021-12-06 DIAGNOSIS — Z0000 Encounter for general adult medical examination without abnormal findings: Secondary | ICD-10-CM | POA: Diagnosis not present

## 2021-12-06 DIAGNOSIS — E782 Mixed hyperlipidemia: Secondary | ICD-10-CM

## 2021-12-06 DIAGNOSIS — E559 Vitamin D deficiency, unspecified: Secondary | ICD-10-CM

## 2021-12-06 DIAGNOSIS — R002 Palpitations: Secondary | ICD-10-CM

## 2021-12-06 DIAGNOSIS — R7301 Impaired fasting glucose: Secondary | ICD-10-CM

## 2021-12-06 DIAGNOSIS — I10 Essential (primary) hypertension: Secondary | ICD-10-CM | POA: Diagnosis not present

## 2021-12-06 DIAGNOSIS — Z803 Family history of malignant neoplasm of breast: Secondary | ICD-10-CM

## 2021-12-06 DIAGNOSIS — F419 Anxiety disorder, unspecified: Secondary | ICD-10-CM | POA: Insufficient documentation

## 2021-12-06 DIAGNOSIS — Z833 Family history of diabetes mellitus: Secondary | ICD-10-CM

## 2021-12-06 MED ORDER — ALPRAZOLAM 0.25 MG PO TABS: 0.2500 mg | ORAL_TABLET | Freq: Two times a day (BID) | ORAL | 0 refills | Status: DC | PRN

## 2021-12-06 NOTE — Telephone Encounter (Signed)
Transition Care Management Follow-up Telephone Call Date of discharge and from where: 12/05/2021 [facility] ER How have you been since you were released from the hospital? Has appt today Any questions or concerns? No  Items Reviewed: Did the pt receive and understand the discharge instructions provided? Yes  Medications obtained and verified? Yes  Other? No  Any new allergies since your discharge? No  Dietary orders reviewed? No Do you have support at home? Yes   Follow up appointments reviewed:  PCP Hospital f/u appt confirmed? Yes  Scheduled to see Tysingner on 12/06/2021 @ 1:30. Are transportation arrangements needed? No  If their condition worsens, is the pt aware to call PCP or go to the Emergency Dept.? Yes Was the patient provided with contact information for the PCP's office or ED? Yes Was to pt encouraged to call back with questions or concerns? Yes

## 2021-12-06 NOTE — Progress Notes (Signed)
Subjective:   HPI  Debbie [name] is a 51 y.o. female who presents for Chief Complaint  Patient presents with   Annual Exam    CPE went to ED on Wednesday BP was high then it got low, had chest discomfort fullness and pressure, back was also hurting,     Patient Care Team: Adrinne Sze, Leward Quan as PCP - General (Family Medicine) Sees dentist Sees eye doctor Dr. Rex Kras, cardiology Dr. Tyson Dense, podiatry Dr. Orene Desanctis, ortho   Concerns: Had a rough week.  This week on 12/04/21 was seen in the emergency department.  She was in usual state of health prior to that day but she got a phone call that day about her prior baby's father that he was having a heart attack.  She may start getting palpitations and chest tightness.  She would not let them pulled over but she was on the highway.  She also drove straight to the hospital to where he was along with her daughter.  Once she realized he was stable, she calmed down but her pressure was checked by nurse today which was high at 170/90.  Her pulse was also high.  She initially had some triage at the emergency department including labs and EKG.  She had not taken her evening doses of amlodipine to her husband about that to the hospital.  After taking this her blood pressure got low and she started feeling bad again.  Eventually she left after being reassured things are stable as her blood pressure stabilized.  She was shocked that she got so worked up and anxious.  She would like some kind of medication to help calm her nerves if this type of thing happens again.  She has not tolerated daily SSRIs in the past.  She does not want any medication that will cause anorgasmia  Constipatoin, sometimes small pellets of poo or "butt nuggets", at least 1 BM soft and long daily though.  HTN - compliant with medication.  Taking Amlodipine 5mg , but takes 1/2 tablet QID.  If she takes 5mg  BID is very sleepy.    BP at home typically  117/20s  Hyperlipidemia - compliant with medication crestor and zetia.  Reviewed their medical, surgical, family, social, medication, and allergy history and updated chart as appropriate.  Past Medical History:  Diagnosis Date   Anemia    history of   Aneurysm of carotid artery (HCC)    hx/o - coiling 02/07/2010   Chronic headache    since aneurysm diagnosis. mostly related to constipation as of 2017   Coronary artery calcification    Edema    Familial hypercholesteremia    LDL over 300; lipoprotein testing 01/2009   Family history of heart disease in female family member before age 42    LDL over 67 prior   H/O cardiovascular stress test 12/08/05   fair exercise capacity, no ST segment changes suggestive of ischemia   H/O echocardiogram 03/22/01   mild asymmetric LVH, mild aortic sclerosis, mild mitral regurgitation   History of blood transfusion    x2 due to heavy bleeding and anemia from fibroids   Hx of CT scan of chest 09/2016   Coronary calcium score of 15. 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 Lillia Carmel)   Vitamin D deficiency    Wears glasses     Family History  Problem Relation Age of Onset   Breast cancer Mother        late 32s   Hypertension Mother    Obesity Mother    Cancer Mother        breast   Heart disease Father 63       hx/o CABG w/ redo, stening, died of MI at age 6yo   Diabetes Father    Hyperlipidemia Father    Heart failure Sister    Heart disease Sister 71       stents - LAD   Hypertension Sister    Aneurysm Sister    Cancer Maternal Grandmother        breast   Stroke Maternal Grandfather    Cancer Paternal Grandfather        prostate cancer   Heart failure Other    Cancer Other      Current Outpatient Medications:    ALPRAZolam  (XANAX) 0.25 MG tablet, Take 1 tablet (0.25 mg total) by mouth 2 (two) times daily as needed for anxiety., Disp: 20 tablet, Rfl: 0   amLODipine (NORVASC) 5 MG tablet, TAKE 1 TABLET BY MOUTH IN THE MORNING AND IN THE EVENING, Disp: 180 tablet, Rfl: 0   aspirin ([employer] ASPIRIN EC) 325 MG EC tablet, Take 1 tablet (325 mg total) by mouth daily., Disp: 90 tablet, Rfl: 3   CRESTOR 20 MG tablet, TAKE 1 TABLET BY MOUTH EVERYDAY AT BEDTIME, Disp: 90 tablet, Rfl: 1   ezetimibe (ZETIA) 10 MG tablet, TAKE 1 TABLET BY MOUTH EVERY DAY, Disp: 90 tablet, Rfl: 2   Cholecalciferol (VITAMIN D3) 50 MCG (2000 UT) CAPS, Take 5,000 Units by mouth daily. (Patient not taking: Reported on 12/06/2021), Disp: , Rfl:    meloxicam (MOBIC) 15 MG tablet, Take 1 tablet (15 mg total) by mouth daily. (Patient not taking: Reported on 12/06/2021), Disp: 30 tablet, Rfl: 3   omeprazole (PRILOSEC) 40 MG capsule, Take 40 mg by mouth daily. (Patient not taking: Reported on 12/06/2021), Disp: , Rfl:   Allergies  Allergen Reactions   Covid-19 (Mrna) Vaccine Anaphylaxis   Covid-19 (Adenovirus) Vaccine Other (See Comments)    High BP, pulses elevated, mild tongue swelling, mild dyspnea   Contrast Media [Iodinated Contrast Media]     For CT Angio, breaks out in hives   Flexeril [Cyclobenzaprine]    Lipitor [Atorvastatin Calcium] Other (See Comments)    Pt states she had a bad reaction several years ago when taking this   Paxil [Paroxetine Hcl]     Inorgasmic, tic/spasm of tongue   Sulfa Antibiotics Other (See Comments)    Unknown reaction     Review of Systems Constitutional: -fever, -chills, -sweats, -unexpected weight change, -decreased appetite, -fatigue Allergy: -sneezing, -itching, -congestion Dermatology: -changing moles, --rash, -lumps ENT: -runny nose, -ear pain, -sore throat, -hoarseness, -sinus pain, -teeth pain, - ringing in ears, -hearing loss, -nosebleeds Cardiology: +chest pain, +palpitations, -swelling, -difficulty  breathing when lying flat, -waking up short of breath Respiratory: -cough, -shortness of breath, -difficulty breathing with exercise or exertion, -wheezing, -coughing up blood Gastroenterology: -abdominal pain, -nausea, -vomiting, -diarrhea, -constipation, -blood in stool, -changes in bowel movement, -difficulty swallowing or eating Hematology: -bleeding, -bruising  Musculoskeletal: -joint aches, -muscle aches, -joint swelling, -back pain, -neck pain, -cramping, -changes in gait Ophthalmology: denies vision changes, eye redness, itching, discharge Urology: -burning with urination, -difficulty urinating, -blood  in urine, -urinary frequency, -urgency, -incontinence Neurology: -headache, -weakness, -tingling, -numbness, -memory loss, -falls, -dizziness Psychology: -depressed mood, -agitation, -sleep problems Breast/gyn: -breast tendnerss, -discharge, -lumps, -vaginal discharge,- irregular periods, -heavy periods      12/06/2021    1:23 PM 03/14/2021   11:26 AM 12/05/2020    1:35 PM 10/30/2020    1:41 PM 12/10/2017    1:15 PM  Depression screen PHQ 2/9  Decreased Interest 0 0 0 0 3  Down, Depressed, Hopeless 0 0 0 0 3  PHQ - 2 Score 0 0 0 0 6  Altered sleeping     3  Tired, decreased energy     3  Change in appetite     1  Feeling bad or failure about yourself      1  Trouble concentrating     3  Moving slowly or fidgety/restless     3  Suicidal thoughts     0  PHQ-9 Score     20  Difficult doing work/chores     Somewhat difficult       Objective:  BP 134/82   Pulse 81   Temp 98.3 F (36.8 C)   Ht 5\' 5"  (1.651 m)   Wt 161 lb 9.6 oz (73.3 kg)   BMI 26.89 kg/m   General appearance: alert, no distress, WD/WN, African American female Skin: unremarkable HEENT: normocephalic, conjunctiva/corneas normal, sclerae anicteric, PERRLA, EOMi, nares patent, no discharge or erythema, pharynx normal Oral cavity: MMM, tongue normal, teeth normal Neck: supple, no lymphadenopathy, no  thyromegaly, no masses, normal ROM, no bruits Chest: non tender, normal shape and expansion Heart: RRR, normal S1, S2, no murmurs Lungs: CTA bilaterally, no wheezes, rhonchi, or rales Abdomen: +bs, soft, non tender, non distended, no masses, no hepatomegaly, no splenomegaly, no bruits Back: non tender, normal ROM, no scoliosis Musculoskeletal: upper extremities non tender, no obvious deformity, normal ROM throughout, lower extremities non tender, no obvious deformity, normal ROM throughout Extremities: no edema, no cyanosis, no clubbing Pulses: 2+ symmetric, upper and lower extremities, normal cap refill Neurological: alert, oriented x 3, CN2-12 intact, strength normal upper extremities and lower extremities, sensation normal throughout, DTRs 2+ throughout, no cerebellar signs, gait normal Psychiatric: normal affect, behavior normal, pleasant  Breast/gyn/rectal - declines    Assessment and Plan :   Encounter Diagnoses  Name Primary?   Encounter for screening mammogram for malignant neoplasm of breast Yes   Essential hypertension, benign    Impaired fasting blood sugar    Encounter for health maintenance examination in adult    Familial hypercholesterolemia    Family history of aneurysm    Family history of breast cancer    Family history of diabetes mellitus    Family history of heart disease in female family member before age 16    History of cerebral aneurysm repair    Mixed dyslipidemia    S/P hysterectomy    Vitamin D deficiency    Influenza vaccination declined     This visit was a preventative care visit, also known as wellness visit or routine physical.   Topics typically include healthy lifestyle, diet, exercise, preventative care, vaccinations, sick and well care, proper use of emergency dept and after hours care, as well as other concerns.     Recommendations: Continue to return yearly for your annual wellness and preventative care visits.  This gives [place] a chance to  discuss healthy lifestyle, exercise, vaccinations, review your chart record, and perform screenings where appropriate.  I  recommend you see your eye doctor yearly for routine vision care.  I recommend you see your dentist yearly for routine dental care including hygiene visits twice yearly.   Vaccination recommendations were reviewed Immunization History  Administered Date(s) Administered   Influenza,inj,Quad PF,6+ Mos 10/28/2012   PFIZER(Purple Top)SARS-COV-2 Vaccination 04/23/2019   Tdap 02/22/2009   Declines flu shot, shingrix, and tetanus boosters today  She had severe reaction with covid vaccine   Screening for cancer: Colon cancer screening: Cologard negative 08/2021  Breast cancer screening: You should perform a self breast exam monthly.   We reviewed recommendations for regular mammograms and breast cancer screening.  Cervical cancer screening: S/p hysterectomy   Skin cancer screening: Check your skin regularly for new changes, growing lesions, or other lesions of concern Come in for evaluation if you have skin lesions of concern.  Lung cancer screening: If you have a greater than 20 pack year history of tobacco use, then you may qualify for lung cancer screening with a chest CT scan.   Please call your insurance company to inquire about coverage for this test.  We currently don't have screenings for other cancers besides breast, cervical, colon, and lung cancers.  If you have a strong family history of cancer or have other cancer screening concerns, please let me know.    Bone health: Get at least 150 minutes of aerobic exercise weekly Get weight bearing exercise at least once weekly Bone density test:  A bone density test is an imaging test that uses a type of X-ray to measure the amount of calcium and other minerals in your bones. The test may be used to diagnose or screen you for a condition that causes weak or thin bones (osteoporosis), predict your risk for a  broken bone (fracture), or determine how well your osteoporosis treatment is working. The bone density test is recommended for females 74 and older, or females or males <41 if certain risk factors such as thyroid disease, long term use of steroids such as for asthma or rheumatological issues, vitamin D deficiency, estrogen deficiency, family history of osteoporosis, self or family history of fragility fracture in first degree relative.    Heart health: Get at least 150 minutes of aerobic exercise weekly Limit alcohol It is important to maintain a healthy blood pressure and healthy cholesterol numbers  Heart disease screening: Screening for heart disease includes screening for blood pressure, fasting lipids, glucose/diabetes screening, BMI height to weight ratio, reviewed of smoking status, physical activity, and diet.    Goals include blood pressure 120/80 or less, maintaining a healthy lipid/cholesterol profile, preventing diabetes or keeping diabetes numbers under good control, not smoking or using tobacco products, exercising most days per week or at least 150 minutes per week of exercise, and eating healthy variety of fruits and vegetables, healthy oils, and avoiding unhealthy food choices like fried food, fast food, high sugar and high cholesterol foods.      Medical care options: I recommend you continue to seek care here first for routine care.  We try really hard to have available appointments Monday through Friday daytime hours for sick visits, acute visits, and physicals.  Urgent care should be used for after hours and weekends for significant issues that cannot wait till the next day.  The emergency department should be used for significant potentially life-threatening emergencies.  The emergency department is expensive, can often have long wait times for less significant concerns, so try to utilize primary care, urgent care, or telemedicine  when possible to avoid unnecessary trips to  the emergency department.  Virtual visits and telemedicine have been introduced since the pandemic started in 2020, and can be convenient ways to receive medical care.  We offer virtual appointments as well to assist you in a variety of options to seek medical care.    Separate significant issues discussed: HTN - compliant with medicaiton  Hyperlipidemia - compliant with medication reportedly, labs today  Vit D deficiency - Labs today  Anxiety -we discussed her recent hospital visit regarding palpitations and anxiety.  I reviewed her hospital emergency department records.  She can probably benefit from having something on hand for anxiety.  Prescription today for short-term as needed anxiety attacks.  Discussed proper use of medication.  Discussed risk and benefits of medication.  Follow-up pending labs  Palpations -resolved seem to be related to anxiety attack with her recent scary phone call about her ex that was having a heart attack.  Pending labs, consider coordinating with cardiology about beta-blocker for help with anxiety and blood pressure.  Currently she is taking half a tablet of blood pressure pill 4 times a day to equal 10 mg a day otherwise this medicine makes her sleepy.  She also can get really anxious at times with certain stressors.  Consider beta-blocker.  History of aneurysm repair, cerebral aneurysm  Constipation-continue good water and fiber intake  Impaired glucose-updated labs today  Adaya was seen today for annual exam.  Diagnoses and all orders for this visit:  Encounter for screening mammogram for malignant neoplasm of breast -     MM DIGITAL SCREENING BILATERAL; Future  Essential hypertension, benign -     Microalbumin/Creatinine Ratio, Urine  Impaired fasting blood sugar -     Hemoglobin A1c  Encounter for health maintenance examination in adult -     Lipid panel -     VITAMIN D 25 Hydroxy (Vit-D Deficiency, Fractures) -     TSH -     Hepatitis C  antibody -     Microalbumin/Creatinine Ratio, Urine -     Hemoglobin A1c  Familial hypercholesterolemia  Family history of aneurysm  Family history of breast cancer  Family history of diabetes mellitus  Family history of heart disease in female family member before age 48  History of cerebral aneurysm repair  Mixed dyslipidemia -     Lipid panel  S/P hysterectomy  Vitamin D deficiency -     VITAMIN D 25 Hydroxy (Vit-D Deficiency, Fractures)  Influenza vaccination declined  Other orders -     ALPRAZolam (XANAX) 0.25 MG tablet; Take 1 tablet (0.25 mg total) by mouth 2 (two) times daily as needed for anxiety.   Follow-up pending labs, yearly for physical

## 2021-12-07 LAB — LIPID PANEL
Chol/HDL Ratio: 4.2 ratio (ref 0.0–4.4)
Cholesterol, Total: 223 mg/dL — ABNORMAL HIGH (ref 100–199)
HDL: 53 mg/dL (ref >39)
LDL Chol Calc (NIH): 157 mg/dL — ABNORMAL HIGH (ref 0–99)
Triglycerides: 76 mg/dL (ref 0–149)
VLDL Cholesterol Cal: 13 mg/dL (ref 5–40)

## 2021-12-07 LAB — MICROALBUMIN / CREATININE URINE RATIO
Creatinine, Urine: 299.7 mg/dL
Microalb/Creat Ratio: 5 mg/g creat (ref 0–29)
Microalbumin, Urine: 15.4 ug/mL

## 2021-12-07 LAB — HEMOGLOBIN A1C
Est. average glucose Bld gHb Est-mCnc: 140 mg/dL
Hgb A1c MFr Bld: 6.5 % — ABNORMAL HIGH (ref 4.8–5.6)

## 2021-12-07 LAB — VITAMIN D 25 HYDROXY (VIT D DEFICIENCY, FRACTURES): Vit D, 25-Hydroxy: 26.9 ng/mL — ABNORMAL LOW (ref 30.0–100.0)

## 2021-12-07 LAB — HEPATITIS C ANTIBODY: Hep C Virus Ab: NONREACTIVE

## 2021-12-07 LAB — TSH: TSH: 1.02 u[IU]/mL (ref 0.450–4.500)

## 2022-01-04 ENCOUNTER — Other Ambulatory Visit: Payer: Self-pay | Admitting: Medical

## 2022-01-18 ENCOUNTER — Other Ambulatory Visit: Payer: Self-pay | Admitting: Cardiology

## 2022-01-18 DIAGNOSIS — I10 Essential (primary) hypertension: Secondary | ICD-10-CM

## 2022-01-21 ENCOUNTER — Ambulatory Visit
Admission: RE | Admit: 2022-01-21 | Discharge: 2022-01-21 | Disposition: A | Discharge status: Home / Self Care | Payer: BC Managed Care – PPO | Source: Ambulatory Visit | Attending: Medical | Admitting: Medical

## 2022-01-21 DIAGNOSIS — Z1231 Encounter for screening mammogram for malignant neoplasm of breast: Secondary | ICD-10-CM

## 2022-01-28 ENCOUNTER — Encounter: Payer: Self-pay | Admitting: Podiatry

## 2022-01-28 ENCOUNTER — Ambulatory Visit: Payer: BC Managed Care – PPO | Admitting: Podiatry

## 2022-01-28 VITALS — BP 128/75 | HR 75

## 2022-01-28 DIAGNOSIS — M7752 Other enthesopathy of left foot: Secondary | ICD-10-CM | POA: Diagnosis not present

## 2022-01-28 DIAGNOSIS — M722 Plantar fascial fibromatosis: Secondary | ICD-10-CM | POA: Diagnosis not present

## 2022-01-28 DIAGNOSIS — L603 Nail dystrophy: Secondary | ICD-10-CM | POA: Diagnosis not present

## 2022-01-28 NOTE — Progress Notes (Signed)
She presents today for follow-up of her Planter fasciitis which was a lateral Planter fasciitis also for her bursitis of her fifth metatarsal area.  She states that had good and bad days but it seems to be getting better and it seems to only be out and here now as she points to the fifth met head and the fourth fifth tarsometatarsal joint.  She has let her nails grow so that we can take samples of them.  Objective: Vital signs stable she is alert oriented x 3 she has a mild tinea pedis of plantar aspect of the bilateral foot she has no tenderness on palpation of the left foot the heel or the tarsometatarsal joints.  However the hallux nail left the fifth nail plates bilateral are thick dystrophic and she does also demonstrate Melanonychia.  Assessment: Nail dystrophy tinea pedis resolving Planter fasciitis.  Plan: Discussed etiology pathology conservative versus surgical therapies.  Samples of the nail were taken today for pathologic evaluation I will follow-up with her in 1 month.  She has taken terbinafine in the past which worked for her.

## 2022-03-04 ENCOUNTER — Ambulatory Visit: Payer: BC Managed Care – PPO | Admitting: Podiatry

## 2022-03-27 ENCOUNTER — Other Ambulatory Visit: Payer: Self-pay | Admitting: Cardiology

## 2022-03-27 DIAGNOSIS — I10 Essential (primary) hypertension: Secondary | ICD-10-CM

## 2022-05-20 ENCOUNTER — Other Ambulatory Visit: Payer: Self-pay | Admitting: Cardiology

## 2022-05-20 DIAGNOSIS — I10 Essential (primary) hypertension: Secondary | ICD-10-CM

## 2022-05-20 NOTE — Telephone Encounter (Signed)
Insurance wont pay for 5mg  bid, can we switch it to 10mg  daily?

## 2022-05-22 NOTE — Telephone Encounter (Signed)
done

## 2022-06-28 ENCOUNTER — Other Ambulatory Visit: Payer: Self-pay | Admitting: Medical

## 2022-06-30 ENCOUNTER — Other Ambulatory Visit: Payer: Self-pay | Admitting: Medical

## 2022-06-30 MED ORDER — ALPRAZOLAM 0.25 MG PO TABS: 0.2500 mg | ORAL_TABLET | Freq: Two times a day (BID) | ORAL | 0 refills | Status: DC | PRN

## 2022-07-31 ENCOUNTER — Other Ambulatory Visit: Payer: Self-pay | Admitting: Medical

## 2022-08-17 ENCOUNTER — Telehealth: Payer: Self-pay | Admitting: Medical

## 2022-08-17 NOTE — Telephone Encounter (Signed)
P.A. Gasper Lloyd RENEWAL

## 2022-08-23 NOTE — Telephone Encounter (Signed)
P.A. approved til 08/16/25 sent mychart message

## 2022-09-18 ENCOUNTER — Ambulatory Visit: Payer: BC Managed Care – PPO | Admitting: Family Medicine

## 2022-09-18 ENCOUNTER — Other Ambulatory Visit (INDEPENDENT_AMBULATORY_CARE_PROVIDER_SITE_OTHER): Payer: BC Managed Care – PPO

## 2022-09-18 ENCOUNTER — Encounter: Payer: Self-pay | Admitting: Family Medicine

## 2022-09-18 VITALS — BP 136/88 | HR 80 | Temp 98.0°F | Resp 16 | Wt 157.2 lb

## 2022-09-18 DIAGNOSIS — R051 Acute cough: Secondary | ICD-10-CM | POA: Diagnosis not present

## 2022-09-18 DIAGNOSIS — J0100 Acute maxillary sinusitis, unspecified: Secondary | ICD-10-CM | POA: Diagnosis not present

## 2022-09-18 LAB — POCT INFLUENZA A/B
Influenza A, POC: NEGATIVE
Influenza B, POC: NEGATIVE

## 2022-09-18 LAB — POC COVID19 BINAXNOW: SARS Coronavirus 2 Ag: NEGATIVE

## 2022-09-18 MED ORDER — AZITHROMYCIN 500 MG PO TABS: 500.0000 mg | ORAL_TABLET | Freq: Every day | ORAL | 0 refills | Status: DC

## 2022-09-18 NOTE — Progress Notes (Signed)
   Subjective:    Patient ID: Debbie [name], female    DOB: 11-Nov-1970, 52 y.o.   MRN: [id number]  HPI She has a 1 week history of difficulty with started with postnasal drainage and slight sore throat.  The drainage is become purulent is now also having a productive cough.  No earache, fever or chills.  She has no underlying allergies and does not smoke.  She does state that her upper teeth are slightly sore.   Review of Systems     Objective:    Physical Exam Alert and in no distress. Tympanic membranes and canals are normal. Pharyngeal area is normal. Neck is supple without adenopathy or thyromegaly. Cardiac exam shows a regular sinus rhythm without murmurs or gallops. Lungs are clear to auscultation.  Slight tenderness to palpation over maxillary sinuses.        Assessment & Plan:   Acute non-recurrent maxillary sinusitis - Plan: azithromycin (ZITHROMAX) 500 MG tablet Apparently azithromycin has worked well in the past.  This seems so of the colchicine again.  She will call if continued difficulty.

## 2022-12-09 ENCOUNTER — Encounter: Payer: BC Managed Care – PPO | Admitting: Medical

## 2022-12-15 ENCOUNTER — Other Ambulatory Visit: Payer: Self-pay | Admitting: Medical

## 2022-12-15 ENCOUNTER — Encounter: Payer: Self-pay | Admitting: Internal Medicine

## 2022-12-29 ENCOUNTER — Encounter: Payer: Self-pay | Admitting: Internal Medicine

## 2022-12-29 ENCOUNTER — Other Ambulatory Visit: Payer: Self-pay | Admitting: Medical

## 2023-02-22 ENCOUNTER — Other Ambulatory Visit: Payer: Self-pay | Admitting: Medical

## 2023-02-23 ENCOUNTER — Telehealth: Payer: Self-pay | Admitting: Internal Medicine

## 2023-02-23 NOTE — Telephone Encounter (Signed)
Will have the front call pt to schedule visit

## 2023-02-23 NOTE — Telephone Encounter (Signed)
Pt is due for a visit. Please call to schedule

## 2023-03-10 ENCOUNTER — Other Ambulatory Visit: Payer: Self-pay | Admitting: Medical

## 2023-03-10 NOTE — Telephone Encounter (Signed)
 Pt has been contacted multiple time in the past month to come in for an appointment. She has read her mychart message to schedule on 02/27/23 but has yet to schedule. I will deny until appt is scheduled

## 2023-04-24 ENCOUNTER — Other Ambulatory Visit: Payer: Self-pay | Admitting: Medical

## 2023-04-24 ENCOUNTER — Encounter: Payer: Self-pay | Admitting: *Deleted

## 2023-04-24 NOTE — Telephone Encounter (Signed)
 LM on patient's vm asking her to please schedule fasting appt as she has not been since 2023. We can fill enough to last until then. Also sent MyChart message.

## 2023-05-27 ENCOUNTER — Ambulatory Visit: Payer: Self-pay | Admitting: Medical

## 2023-05-27 ENCOUNTER — Other Ambulatory Visit: Payer: Self-pay | Admitting: Medical

## 2023-05-27 ENCOUNTER — Ambulatory Visit
Admission: RE | Admit: 2023-05-27 | Discharge: 2023-05-27 | Disposition: A | Discharge status: Home / Self Care | Source: Ambulatory Visit | Attending: Medical | Admitting: Medical

## 2023-05-27 ENCOUNTER — Telehealth: Payer: Self-pay

## 2023-05-27 ENCOUNTER — Other Ambulatory Visit (HOSPITAL_COMMUNITY): Payer: Self-pay

## 2023-05-27 ENCOUNTER — Ambulatory Visit: Admitting: Medical

## 2023-05-27 VITALS — BP 120/70 | HR 98 | Wt 157.4 lb

## 2023-05-27 DIAGNOSIS — M5432 Sciatica, left side: Secondary | ICD-10-CM

## 2023-05-27 DIAGNOSIS — M5442 Lumbago with sciatica, left side: Secondary | ICD-10-CM

## 2023-05-27 DIAGNOSIS — K5900 Constipation, unspecified: Secondary | ICD-10-CM

## 2023-05-27 DIAGNOSIS — M25552 Pain in left hip: Secondary | ICD-10-CM

## 2023-05-27 MED ORDER — NAPROXEN 125 MG/5ML PO SUSP: 375.0000 mg | Freq: Two times a day (BID) | ORAL | 0 refills | Status: DC

## 2023-05-27 MED ORDER — HYDROCODONE-ACETAMINOPHEN 7.5-325 MG/15ML PO SOLN: 15.0000 mL | Freq: Two times a day (BID) | ORAL | 0 refills | Status: DC | PRN

## 2023-05-27 MED ORDER — POLYETHYLENE GLYCOL 3350 17 GM/SCOOP PO POWD: 17.0000 g | Freq: Two times a day (BID) | ORAL | 0 refills | Status: AC | PRN

## 2023-05-27 NOTE — Patient Instructions (Signed)
 Please go to [facility] for your back and hip xrays.   Their hours are 8am - 4:30 pm Monday - Friday.  Take your insurance card with you.  [facility] [phone]   [address] [place], [place] [address]

## 2023-05-27 NOTE — Progress Notes (Signed)
 Subjective:  Debbie [name] is a 53 y.o. female who presents for Chief Complaint  Patient presents with   Back Pain    Back pain that radiates down leg and feet. More pain on the left side. Conspitation- last BM yesterday but its like pellets     Here for back pain.  Has hx/o occasional back pain.   Sometimes flare ups last a few days ago, but lately back has been giving her some problems.  Sometimes pain is toothache type pain.   Sometimes throbbing pain.  Having low back and buttock pain, deeper than muscle.   Pain radiates down left leg.   But since she has been favoring that leg, has now gotten some right leg pain as well.  Sometimes sharp pain in left hip.  No incontinence, no fever, no saddle anesthesia.  Lately having some constipation as well.  She can get longer formed stools or little pellets of stool.  Has a bowel movement daily this been really dry lately.  She feels like she does a good job of fiber and water intake.  No other aggravating or relieving factors.    No other c/o.  Past Medical History:  Diagnosis Date   Anemia    history of   Aneurysm of carotid artery (HCC)    hx/o - coiling 02/07/2010   Chronic headache    since aneurysm diagnosis. mostly related to constipation as of 2017   Coronary artery calcification    Edema    Familial hypercholesteremia    LDL over 300; lipoprotein testing 01/2009   Family history of heart disease in female family member before age 39    LDL over 300 prior   H/O cardiovascular stress test 12/08/05   fair exercise capacity, no ST segment changes suggestive of ischemia   H/O echocardiogram 03/22/01   mild asymmetric LVH, mild aortic sclerosis, mild mitral regurgitation   History of blood transfusion    x2 due to heavy bleeding and anemia from fibroids   Hx of CT scan of chest 09/2016   Coronary calcium score of 15. This was 31 percentile for age and sex   Hypertension    Prediabetes    S/P partial hysterectomy    due to uterine  fibroids   Vertebral artery aneurysm (HCC)    left, unruptured, no surgery correction, monitoring since 2011 (followed by Dr. Nigel Sloop)   Vitamin D deficiency    Wears glasses    Current Outpatient Medications on File Prior to Visit  Medication Sig Dispense Refill   amLODipine (NORVASC) 10 MG tablet Take 1 tablet (10 mg total) by mouth daily. 90 tablet 3   aspirin ([facility] ASPIRIN EC) 325 MG EC tablet Take 1 tablet (325 mg total) by mouth daily. 90 tablet 3   CRESTOR 20 MG tablet TAKE 1 TABLET BY MOUTH EVERYDAY AT BEDTIME (Patient not taking: Reported on 05/27/2023) 30 tablet 0   ezetimibe (ZETIA) 10 MG tablet TAKE 1 TABLET BY MOUTH EVERY DAY (Patient not taking: Reported on 05/27/2023) 30 tablet 0   No current facility-administered medications on file prior to visit.    The following portions of the patient's history were reviewed and updated as appropriate: allergies, current medications, past family history, past medical history, past social history, past surgical history and problem list.  ROS Otherwise as in subjective above    Objective: BP 120/70   Pulse 98   Wt 157 lb 6.4 oz (71.4 kg)   BMI 26.19 kg/m  Wt Readings from Last 3 Encounters:  05/27/23 157 lb 6.4 oz (71.4 kg)  09/18/22 157 lb 3.2 oz (71.3 kg)  12/06/21 161 lb 9.6 oz (73.3 kg)   General appearance: alert, no distress, well developed, well nourished Back: nontender to palpation, no obvious deformity of the back, normal range of motion Legs normal range of motion without obvious deformity or tenderness Legs neurovascularly intact Negative straight leg raise    Assessment: Encounter Diagnoses  Name Primary?   Bilateral low back pain with left-sided sciatica, unspecified chronicity Yes   Pain of left hip    Sciatica of left side      Plan: I reviewed back over her imaging from 2016, lumbar spine and hip.  She had degenerative changes at that time of the lumbar spine and normal hip x-ray  She was to  have updated imaging at this point.  Will send her for lumbar spine and hip x-ray.  She will look into the chiropractor on my recommendation  She can use Naprosyn twice daily for the next 5 to 7 days for pain inflammation.  Can use hydrocodone for worse pain.  She prefers a liquid and not a tablet.  Continue Stretching routine she is doing already.  Constipation - continue efforts with water and fiber intake.   Can use OTC miralax daily or every other day.  Marigold was seen today for back pain.  Diagnoses and all orders for this visit:  Bilateral low back pain with left-sided sciatica, unspecified chronicity -     DG Lumbar Spine 2-3 Views  Pain of left hip -     DG HIP UNILAT W OR W/O PELVIS 2-3 VIEWS LEFT  Sciatica of left side -     DG Lumbar Spine 2-3 Views -     DG HIP UNILAT W OR W/O PELVIS 2-3 VIEWS LEFT  Other orders -     naproxen (NAPROSYN) 125 MG/5ML suspension; Take 15 mLs (375 mg total) by mouth 2 (two) times daily with a meal. -     HYDROcodone-acetaminophen (HYCET) 7.5-325 mg/15 ml solution; Take 15 mLs by mouth 2 (two) times daily as needed for moderate pain (pain score 4-6). -     polyethylene glycol powder (GLYCOLAX/MIRALAX) 17 GM/SCOOP powder; Take 17 g by mouth 2 (two) times daily as needed.   Follow up: with xray

## 2023-05-27 NOTE — Telephone Encounter (Signed)
 I have received a request via cmm from the pts pref'd pharmacy that Rx HYDROcodone-Acetaminophen 7.5-325MG /15ML solution needed a Prior Auth. However after speaking with the pharmacist at the pts pref'd pharmacy and running a test claim on my end. The medication has now filled and no P/A is/was needed. Patient cost is 5.00. Nothing further needed

## 2023-05-27 NOTE — Progress Notes (Signed)
 Results sent through MyChart

## 2023-05-27 NOTE — Telephone Encounter (Signed)
 Copied from CRM [id number]. Topic: Clinical - Red Word Triage >> May 27, 2023 10:36 AM Debbie [name] wrote: Red Word that prompted transfer to Nurse Triage: Patient is experiencing severe back pain, flare ups( back), and constipation   Chief Complaint: Back pain Symptoms: back pain, constipation Frequency: intermittent Pertinent Negatives: Patient denies chest pain Disposition: [] ED /[] Urgent Care (no appt availability in office) / [] Appointment(In office/virtual)/ []  [facility]/ [] Home Care/ [] Refused Recommended Disposition /[] [facility]/ []  Follow-up with PCP Additional Notes: Patient reports ongoing bacfk pain worsening over the past month. Appt scheduled with Onalee Hua, PA for today at 3:30.    Reason for Disposition  Back pain is a chronic symptom (recurrent or ongoing AND present > 4 weeks)  Answer Assessment - Initial Assessment Questions 1. ONSET: "When did the pain begin?"      Ongoing issues for several years; worsening over the last month  2. LOCATION: "Where does it hurt?" (upper, mid or lower back)     Tail bone  3. SEVERITY: "How bad is the pain?"  (e.g., Scale 1-10; mild, moderate, or severe)   - MILD (1-3): Doesn't interfere with normal activities.    - MODERATE (4-7): Interferes with normal activities or awakens from sleep.    - SEVERE (8-10): Excruciating pain, unable to do any normal activities.      Severe pain  4. PATTERN: "Is the pain constant?" (e.g., yes, no; constant, intermittent)      Intermittent  5. RADIATION: "Does the pain shoot into your legs or somewhere else?"     Radiates into buttocks, feels lik in bone. Also radiates down leg, but sometimes the right  6. CAUSE:  "What do you think is causing the back pain?"      Arthritis in spine  7. BACK OVERUSE:  "Any recent lifting of heavy objects, strenuous work or exercise?"     No  8. MEDICINES: "What have you taken so far for the pain?" (e.g., nothing, acetaminophen, NSAIDS)      Tylenol and ibuprofen  9. NEUROLOGIC SYMPTOMS: "Do you have any weakness, numbness, or problems with bowel/bladder control?"     No  10. OTHER SYMPTOMS: "Do you have any other symptoms?" (e.g., fever, abdomen pain, burning with urination, blood in urine)       Constipation  11. PREGNANCY: "Is there any chance you are pregnant?" "When was your last menstrual period?"       no  Protocols used: Back Pain-A-AH

## 2023-05-28 ENCOUNTER — Other Ambulatory Visit: Payer: Self-pay | Admitting: Medical

## 2023-05-28 ENCOUNTER — Telehealth: Payer: Self-pay | Admitting: Internal Medicine

## 2023-05-28 ENCOUNTER — Other Ambulatory Visit: Payer: Self-pay

## 2023-05-28 DIAGNOSIS — I10 Essential (primary) hypertension: Secondary | ICD-10-CM

## 2023-05-28 MED ORDER — AMLODIPINE BESYLATE 10 MG PO TABS: 10.0000 mg | ORAL_TABLET | Freq: Every day | ORAL | 0 refills | Status: DC

## 2023-05-28 NOTE — Telephone Encounter (Signed)
 The front called her yesterday to schedule.

## 2023-05-28 NOTE — Telephone Encounter (Signed)
 Refilled already

## 2023-05-28 NOTE — Telephone Encounter (Signed)
 Pt is due for CPE. Will refill for 30 days

## 2023-05-28 NOTE — Telephone Encounter (Signed)
 Copied from CRM [id number]. Topic: Clinical - Prescription Issue >> May 27, 2023  5:47 PM Taleah C wrote: Reason for CRM: pt called and stated that Dr. Aleen Campi agreed to refill the patient's prescription, Crestor, once an OV was completed. However, the prescription was not sent in today. Please call and advise.

## 2023-07-07 ENCOUNTER — Other Ambulatory Visit: Payer: Self-pay | Admitting: Medical

## 2023-07-07 MED ORDER — CRESTOR 20 MG PO TABS: 20.0000 mg | ORAL_TABLET | Freq: Every day | ORAL | 0 refills | Status: DC

## 2023-07-07 NOTE — Telephone Encounter (Signed)
Pt is due for a visit.

## 2023-07-07 NOTE — Telephone Encounter (Signed)
 Last lipid was 2023. Overdue for a visit. Will deny medication until appt is scheduled

## 2023-07-07 NOTE — Telephone Encounter (Signed)
 Refilled for 30 days

## 2023-07-07 NOTE — Addendum Note (Signed)
 Addended by: Charliene Conte A on: 07/07/2023 02:51 PM   Modules accepted: Orders

## 2023-07-14 ENCOUNTER — Other Ambulatory Visit: Payer: Self-pay | Admitting: Cardiology

## 2023-07-14 DIAGNOSIS — I10 Essential (primary) hypertension: Secondary | ICD-10-CM

## 2023-07-22 ENCOUNTER — Ambulatory Visit: Admitting: Medical

## 2023-07-22 ENCOUNTER — Ambulatory Visit
Admission: RE | Admit: 2023-07-22 | Discharge: 2023-07-22 | Disposition: A | Discharge status: Home / Self Care | Source: Ambulatory Visit | Attending: Medical | Admitting: Medical

## 2023-07-22 VITALS — BP 130/70 | HR 69 | Ht 64.0 in | Wt 158.6 lb

## 2023-07-22 DIAGNOSIS — Z1231 Encounter for screening mammogram for malignant neoplasm of breast: Secondary | ICD-10-CM

## 2023-07-22 DIAGNOSIS — E7801 Familial hypercholesterolemia: Secondary | ICD-10-CM

## 2023-07-22 DIAGNOSIS — L309 Dermatitis, unspecified: Secondary | ICD-10-CM

## 2023-07-22 DIAGNOSIS — R7301 Impaired fasting glucose: Secondary | ICD-10-CM

## 2023-07-22 DIAGNOSIS — F419 Anxiety disorder, unspecified: Secondary | ICD-10-CM

## 2023-07-22 DIAGNOSIS — N952 Postmenopausal atrophic vaginitis: Secondary | ICD-10-CM

## 2023-07-22 DIAGNOSIS — E78019 Familial hypercholesterolemia, unspecified: Secondary | ICD-10-CM

## 2023-07-22 DIAGNOSIS — I10 Essential (primary) hypertension: Secondary | ICD-10-CM | POA: Diagnosis not present

## 2023-07-22 DIAGNOSIS — R102 Pelvic and perineal pain unspecified side: Secondary | ICD-10-CM

## 2023-07-22 DIAGNOSIS — E559 Vitamin D deficiency, unspecified: Secondary | ICD-10-CM

## 2023-07-22 DIAGNOSIS — R7989 Other specified abnormal findings of blood chemistry: Secondary | ICD-10-CM

## 2023-07-22 DIAGNOSIS — Z90710 Acquired absence of both cervix and uterus: Secondary | ICD-10-CM | POA: Diagnosis not present

## 2023-07-22 DIAGNOSIS — R400 Somnolence: Secondary | ICD-10-CM

## 2023-07-22 DIAGNOSIS — N941 Unspecified dyspareunia: Secondary | ICD-10-CM

## 2023-07-22 DIAGNOSIS — R5383 Other fatigue: Secondary | ICD-10-CM

## 2023-07-22 MED ORDER — BETAMETHASONE DIPROPIONATE AUG 0.05 % EX LOTN: TOPICAL_LOTION | Freq: Two times a day (BID) | CUTANEOUS | 1 refills | Status: AC

## 2023-07-22 MED ORDER — AMLODIPINE BESYLATE 10 MG PO TABS: 10.0000 mg | ORAL_TABLET | Freq: Every day | ORAL | 2 refills | Status: DC

## 2023-07-22 MED ORDER — CRESTOR 20 MG PO TABS: 20.0000 mg | ORAL_TABLET | Freq: Every day | ORAL | 2 refills | Status: AC

## 2023-07-22 MED ORDER — ESTRADIOL 0.1 MG/GM VA CREA: 1.0000 | TOPICAL_CREAM | VAGINAL | 1 refills | Status: DC

## 2023-07-22 NOTE — Progress Notes (Signed)
 [facility]  We would have to create a new referral with the dx on them as they would have to create a new case for him for the hip/shoulder pain

## 2023-07-22 NOTE — Progress Notes (Signed)
 [facility] has female providers for Pelvic Therapy but they are booked into August for 1st appointments

## 2023-07-22 NOTE — Progress Notes (Signed)
Referred to [facility]

## 2023-07-22 NOTE — Progress Notes (Signed)
 [facility] has pelvic pain female therapist for painful penetration/sex.

## 2023-07-22 NOTE — Progress Notes (Signed)
 Subjective:  Debbie [name] is a 53 y.o. female who presents for Chief Complaint  Patient presents with   Medical Management of Chronic Issues    Fasting med check, no concerns, will schedule eye appointment, declines shingles and tdap today     Here for med check.  Hypertension-compliant with amlodipine   Hyperlipidemia-compliant with Crestor  without complaint  She did follow-up with dermatology and then saw a black dermatologist in [place] about her scalp.  They told her  to quit using relaxers.  They gave her betamethasone  lotion that worked really well when she has scaly flareups.  She like this refilled.  It is too expensive to go back to dermatology  She has problems with constipation.  She does not honestly take medication for this.  She would like some advice on dietary changes.  She tries to get fiber in good water intake.  She does use MiraLAX  occasionally.  She has concerns about fatigue, does not feel that she is in the sleep.  Feels drained.  When her constipation is worse, feels worse overall including fatigue.    When bowels are more regularly overall feels better too.   Needs mammogram.  Has noticed less sexual desire in recent years.   Has pain with interbourse, and has always had some level of pain with penetration.  Married, husband desired sex, but often she is unable to.    No other aggravating or relieving factors.    No other c/o.  Past Medical History:  Diagnosis Date   Anemia    history of   Aneurysm of carotid artery (HCC)    hx/o - coiling 02/07/2010   Chronic headache    since aneurysm diagnosis. mostly related to constipation as of 2017   Coronary artery calcification    Edema    Familial hypercholesteremia    LDL over 300; lipoprotein testing 01/2009   Family history of heart disease in female family member before age 45    LDL over 300 prior   H/O cardiovascular stress test 12/08/05   fair exercise capacity, no ST segment changes suggestive  of ischemia   H/O echocardiogram 03/22/01   mild asymmetric LVH, mild aortic sclerosis, mild mitral regurgitation   History of blood transfusion    x2 due to heavy bleeding and anemia from fibroids   Hx of CT scan of chest 09/2016   Coronary calcium  score of 15. This was 18 percentile for age and sex   Hypertension    Prediabetes    S/P partial hysterectomy    due to uterine fibroids   Vertebral artery aneurysm (HCC)    left, unruptured, no surgery correction, monitoring since 2011 (followed by Dr. Lyda Samples)   Vitamin D  deficiency    Wears glasses    Current Outpatient Medications on File Prior to Visit  Medication Sig Dispense Refill   aspirin  ([facility] ASPIRIN  EC) 325 MG EC tablet Take 1 tablet (325 mg total) by mouth daily. 90 tablet 3   polyethylene glycol powder (GLYCOLAX /MIRALAX ) 17 GM/SCOOP powder Take 17 g by mouth 2 (two) times daily as needed. 3350 g 0   ezetimibe  (ZETIA ) 10 MG tablet TAKE 1 TABLET BY MOUTH EVERY DAY (Patient not taking: Reported on 07/22/2023) 30 tablet 0   naproxen  (NAPROSYN ) 125 MG/5ML suspension Take 15 mLs (375 mg total) by mouth 2 (two) times daily with a meal. (Patient not taking: Reported on 07/22/2023) 250 mL 0   No current facility-administered medications on file prior to visit.  Past Surgical History:  Procedure Laterality Date   ABDOMINAL HYSTERECTOMY     ANEURYSM COILING  02/07/10   stent assisted coiling of right internal carotid artery aneurysm in periophthalmic region   CESAREAN SECTION     COLONOSCOPY  2011   Dr. Tova Fresh, anemia workup   ESOPHAGOGASTRODUODENOSCOPY  2011   Dr. Tova Fresh   HERNIA REPAIR     epigastric hernia repair age 47, right inguinal hernia as a child   PARTIAL HYSTERECTOMY     abdominal due to fibroids, still has ovaries, Dr. Monty App   TUBAL LIGATION     tubal reversal  2008     The following portions of the patient's history were reviewed and updated as appropriate: allergies, current medications, past family history,  past medical history, past social history, past surgical history and problem list.  ROS Otherwise as in subjective above     Objective: BP 130/70   Pulse 69   Ht 5\' 4"  (1.626 m)   Wt 158 lb 9.6 oz (71.9 kg)   SpO2 98%   BMI 27.22 kg/m   General appearance: alert, no distress, well developed, well nourished Neck: supple, no lymphadenopathy, no thyromegaly, no masses Heart: RRR, normal S1, S2, no murmurs Lungs: CTA bilaterally, no wheezes, rhonchi, or rales Abdomen: +bs, soft, non tender, non distended, no masses, no hepatomegaly, no splenomegaly Pulses: 2+ radial pulses, 2+ pedal pulses, normal cap refill Ext: no edema Breast/gyn: breast without obvious abnormality, nodule or skin changes, no lymphadenopathy Normal external genitalia, she is guarded and somewhat tender on digital exam.  Declined speculum exam.  No adnexal mass or tendnerss.     Assessment: Encounter Diagnoses  Name Primary?   Essential hypertension, benign Yes   Familial hypercholesterolemia    Vitamin D  deficiency    S/P hysterectomy    Impaired fasting blood sugar    Anxiety    Fatigue, unspecified type    Daytime somnolence    Screening mammogram for breast cancer    Dermatitis    Pelvic pain    Dyspareunia in female    Atrophic vaginitis    Essential hypertension      Plan: Hypertension-continue current medication Amlodipine  10 mg daily  Scalp dermatitis-does well with betamethasone cream for scaly rash.  This was initially prescribed by dermatology but she uses it periodically.  Hyperlipidemia-continue Crestor  20 mg daily.  Labs today.  Screening for mammogram for breast cancer screening-call and schedule mammogram  Pelvic pain, dyspareunia, atrophic vaginitis-begin trial of hormone cream.  Referral to pelvic floor physical therapy  Impaired fasting glucose - updated labs today  Fatigue - labs today  Daytime somnolence, fatigue - referral for sleep study, home sleep study  Debbie [name]  was seen today for medical management of chronic issues.  Diagnoses and all orders for this visit:  Essential hypertension, benign -     TSH + free T4 -     T3 -     Microalbumin/Creatinine Ratio, Urine -     Comprehensive metabolic panel with GFR -     CBC with Differential/Platelet  Familial hypercholesterolemia -     Lipid panel  Vitamin D  deficiency -     VITAMIN D  25 Hydroxy (Vit-D Deficiency, Fractures)  S/P hysterectomy  Impaired fasting blood sugar -     Hemoglobin A1c -     Microalbumin/Creatinine Ratio, Urine  Anxiety  Fatigue, unspecified type -     TSH + free T4 -     T3 -  Sedimentation rate  Daytime somnolence  Screening mammogram for breast cancer -     MM 3D SCREENING MAMMOGRAM BILATERAL BREAST  Dermatitis  Pelvic pain  Dyspareunia in female  Atrophic vaginitis  Essential hypertension -     amLODipine  (NORVASC ) 10 MG tablet; Take 1 tablet (10 mg total) by mouth daily.  Other orders -     betamethasone, augmented, (DIPROLENE) 0.05 % lotion; Apply topically 2 (two) times daily. -     estradiol (ESTRACE VAGINAL) 0.1 MG/GM vaginal cream; Place 1 Applicatorful vaginally 2 (two) times a week. -     CRESTOR  20 MG tablet; Take 1 tablet (20 mg total) by mouth daily.  Spent > 45 minutes face to face with patient in discussion of symptoms, evaluation, plan and recommendations.    Follow up: pending labs

## 2023-07-23 ENCOUNTER — Other Ambulatory Visit: Payer: Self-pay | Admitting: Medical

## 2023-07-23 ENCOUNTER — Ambulatory Visit: Payer: Self-pay | Admitting: Medical

## 2023-07-23 LAB — TSH+FREE T4
Free T4: 1.02 ng/dL (ref 0.82–1.77)
TSH: 1.04 u[IU]/mL (ref 0.450–4.500)

## 2023-07-23 LAB — CBC WITH DIFFERENTIAL/PLATELET
Basophils Absolute: 0.1 10*3/uL (ref 0.0–0.2)
Basos: 1 %
EOS (ABSOLUTE): 0.1 10*3/uL (ref 0.0–0.4)
Eos: 1 %
Hematocrit: 43.1 % (ref 34.0–46.6)
Hemoglobin: 14.2 g/dL (ref 11.1–15.9)
Immature Grans (Abs): 0 10*3/uL (ref 0.0–0.1)
Immature Granulocytes: 0 %
Lymphocytes Absolute: 2.8 10*3/uL (ref 0.7–3.1)
Lymphs: 40 %
MCH: 29.6 pg (ref 26.6–33.0)
MCHC: 32.9 g/dL (ref 31.5–35.7)
MCV: 90 fL (ref 79–97)
Monocytes Absolute: 0.5 10*3/uL (ref 0.1–0.9)
Monocytes: 7 %
Neutrophils Absolute: 3.5 10*3/uL (ref 1.4–7.0)
Neutrophils: 51 %
Platelets: 383 10*3/uL (ref 150–450)
RBC: 4.79 x10E6/uL (ref 3.77–5.28)
RDW: 12.6 % (ref 11.7–15.4)
WBC: 6.9 10*3/uL (ref 3.4–10.8)

## 2023-07-23 LAB — MICROALBUMIN / CREATININE URINE RATIO
Creatinine, Urine: 97.6 mg/dL
Microalb/Creat Ratio: 13 mg/g{creat} (ref 0–29)
Microalbumin, Urine: 13 ug/mL

## 2023-07-23 LAB — COMPREHENSIVE METABOLIC PANEL WITH GFR
ALT: 14 IU/L (ref 0–32)
AST: 15 IU/L (ref 0–40)
Albumin: 4.7 g/dL (ref 3.8–4.9)
Alkaline Phosphatase: 108 IU/L (ref 44–121)
BUN/Creatinine Ratio: 14 (ref 9–23)
BUN: 12 mg/dL (ref 6–24)
Bilirubin Total: 0.6 mg/dL (ref 0.0–1.2)
CO2: 21 mmol/L (ref 20–29)
Calcium: 9.9 mg/dL (ref 8.7–10.2)
Chloride: 102 mmol/L (ref 96–106)
Creatinine, Ser: 0.84 mg/dL (ref 0.57–1.00)
Globulin, Total: 3 g/dL (ref 1.5–4.5)
Glucose: 91 mg/dL (ref 70–99)
Potassium: 4.3 mmol/L (ref 3.5–5.2)
Sodium: 140 mmol/L (ref 134–144)
Total Protein: 7.7 g/dL (ref 6.0–8.5)
eGFR: 82 mL/min/{1.73_m2} (ref >59)

## 2023-07-23 LAB — VITAMIN D 25 HYDROXY (VIT D DEFICIENCY, FRACTURES): Vit D, 25-Hydroxy: 33.3 ng/mL (ref 30.0–100.0)

## 2023-07-23 LAB — LIPID PANEL
Chol/HDL Ratio: 5.4 ratio — ABNORMAL HIGH (ref 0.0–4.4)
Cholesterol, Total: 279 mg/dL — ABNORMAL HIGH (ref 100–199)
HDL: 52 mg/dL (ref >39)
LDL Chol Calc (NIH): 217 mg/dL — ABNORMAL HIGH (ref 0–99)
Triglycerides: 65 mg/dL (ref 0–149)
VLDL Cholesterol Cal: 10 mg/dL (ref 5–40)

## 2023-07-23 LAB — HEMOGLOBIN A1C
Est. average glucose Bld gHb Est-mCnc: 131 mg/dL
Hgb A1c MFr Bld: 6.2 % — ABNORMAL HIGH (ref 4.8–5.6)

## 2023-07-23 LAB — SEDIMENTATION RATE: Sed Rate: 24 mm/h (ref 0–40)

## 2023-07-23 LAB — T3: T3, Total: 146 ng/dL (ref 71–180)

## 2023-07-23 MED ORDER — VITAMIN D (ERGOCALCIFEROL) 1.25 MG (50000 UNIT) PO CAPS: 50000.0000 [IU] | ORAL_CAPSULE | ORAL | 3 refills | Status: AC

## 2023-07-23 NOTE — Progress Notes (Signed)
 [facility] has female therapist but booked out until August.    [facility] has opening mid June for therapy so I will put in a referral there.

## 2023-07-23 NOTE — Progress Notes (Signed)
 Results sent through MyChart

## 2023-07-23 NOTE — Addendum Note (Signed)
 Addended by: Charliene Conte A on: 07/23/2023 11:37 AM   Modules accepted: Orders

## 2023-07-24 ENCOUNTER — Telehealth: Payer: Self-pay | Admitting: Internal Medicine

## 2023-07-24 ENCOUNTER — Telehealth: Payer: Self-pay | Admitting: Cardiology

## 2023-07-24 DIAGNOSIS — I10 Essential (primary) hypertension: Secondary | ICD-10-CM

## 2023-07-24 MED ORDER — AMLODIPINE BESYLATE 10 MG PO TABS: 10.0000 mg | ORAL_TABLET | Freq: Every day | ORAL | 1 refills | Status: DC

## 2023-07-24 NOTE — Telephone Encounter (Signed)
*  STAT* If patient is at the pharmacy, call can be transferred to refill team.   1. Which medications need to be refilled? (please list name of each medication and dose if known) amLODipine  (NORVASC ) 10 MG tablet    2. Would you like to learn more about the convenience, safety, & potential cost savings by using the [facility]?      3. Are you open to using the [facility] (Type [facility].  ).   4. Which pharmacy/location (including street and city if local pharmacy) is medication to be sent to? [id number] - [place], [place] - [address]    5. Do they need a 30 day or 90 day supply? 30 day

## 2023-07-24 NOTE — Telephone Encounter (Signed)
 Pt's medication was sent to pt's pharmacy as requested. Confirmation received.

## 2023-07-24 NOTE — Telephone Encounter (Signed)
 Copied from CRM [id number]. Topic: Referral - Request for Referral >> Jul 24, 2023  1:36 PM Carlatta H wrote: Did the patient discuss referral with their provider in the last year? Yes (If No - schedule appointment) (If Yes - send message)  Appointment offered? No  Type of order/referral and detailed reason for visit: Thyroid  Preference of office, provider, location: specialist in [place]  If referral order, have you been seen by this specialty before? No (If Yes, this issue or another issue? When? Where?  Can we respond through MyChart? Yes

## 2023-07-27 ENCOUNTER — Encounter: Payer: Self-pay | Admitting: Internal Medicine

## 2023-07-27 ENCOUNTER — Other Ambulatory Visit: Payer: Self-pay | Admitting: Medical

## 2023-07-27 DIAGNOSIS — R7989 Other specified abnormal findings of blood chemistry: Secondary | ICD-10-CM

## 2023-07-27 DIAGNOSIS — E049 Nontoxic goiter, unspecified: Secondary | ICD-10-CM | POA: Insufficient documentation

## 2023-07-27 NOTE — Telephone Encounter (Signed)
 Shane placed order for Thyroid ultrasound

## 2023-07-27 NOTE — Progress Notes (Signed)
 Please have lab add TPO thyroid antibodies and T3 uptake blood test  Let her know that I have added on a couple additional tests and ordered an ultrasound of the thyroid

## 2023-07-27 NOTE — Progress Notes (Signed)
 Results sent through MyChart

## 2023-07-28 NOTE — Progress Notes (Signed)
 Results sent through MyChart

## 2023-07-29 ENCOUNTER — Ambulatory Visit
Admission: RE | Admit: 2023-07-29 | Discharge: 2023-07-29 | Disposition: A | Discharge status: Home / Self Care | Source: Ambulatory Visit | Attending: Medical | Admitting: Medical

## 2023-07-29 DIAGNOSIS — I10 Essential (primary) hypertension: Secondary | ICD-10-CM | POA: Insufficient documentation

## 2023-07-29 DIAGNOSIS — R7989 Other specified abnormal findings of blood chemistry: Secondary | ICD-10-CM

## 2023-07-30 LAB — T3 UPTAKE: T3 Uptake Ratio: 24 % (ref 24–39)

## 2023-07-30 LAB — SPECIMEN STATUS REPORT

## 2023-07-30 LAB — THYROID PEROXIDASE ANTIBODY: Thyroperoxidase Ab SerPl-aCnc: 20 [IU]/mL (ref 0–34)

## 2023-08-02 ENCOUNTER — Ambulatory Visit: Payer: Self-pay | Admitting: Medical

## 2023-08-02 NOTE — Progress Notes (Signed)
 Results sent through MyChart

## 2023-09-02 ENCOUNTER — Ambulatory Visit: Attending: Cardiology | Admitting: Cardiology

## 2023-09-02 ENCOUNTER — Encounter: Payer: Self-pay | Admitting: Cardiology

## 2023-09-02 VITALS — BP 136/85 | HR 81 | Resp 16 | Ht 64.0 in | Wt 157.0 lb

## 2023-09-02 DIAGNOSIS — I340 Nonrheumatic mitral (valve) insufficiency: Secondary | ICD-10-CM

## 2023-09-02 DIAGNOSIS — I2510 Atherosclerotic heart disease of native coronary artery without angina pectoris: Secondary | ICD-10-CM

## 2023-09-02 DIAGNOSIS — I2584 Coronary atherosclerosis due to calcified coronary lesion: Secondary | ICD-10-CM | POA: Diagnosis not present

## 2023-09-02 DIAGNOSIS — Z8249 Family history of ischemic heart disease and other diseases of the circulatory system: Secondary | ICD-10-CM | POA: Diagnosis not present

## 2023-09-02 DIAGNOSIS — I10 Essential (primary) hypertension: Secondary | ICD-10-CM | POA: Diagnosis not present

## 2023-09-02 DIAGNOSIS — E7800 Pure hypercholesterolemia, unspecified: Secondary | ICD-10-CM

## 2023-09-02 NOTE — Progress Notes (Signed)
 Cardiology Office Note:  .   Date:  09/02/2023  ID:  Debbie [name], DOB 04-05-1970, MRN [id number] PCP:  Bulah Alm GORMAN DEVONNA  Former Cardiology Providers: Dr. Alm Clay [facility] Providers Cardiologist:  Madonna Large, DO , [facility] (established care 10/03/2019) Electrophysiologist:  None  Click to update primary MD,subspecialty MD or APP then REFRESH:1}    Chief Complaint  Patient presents with   Follow-up    Coronary artery calcification and family hx of CAD (premature)    History of Present Illness: .   Debbie [name] is a 53 y.o. 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   Initially referred to the practice for evaluation and management of hypertension and family history of premature coronary artery disease.  She also endorsed symptoms of palpitations & she did undergo cardiac monitor.  Shared decision was to monitor clinically as her symptoms are overall improving.  With regards to her family history of premature CAD patient did undergo preliminary testing as mentioned below and was encouraged to improve her modifiable cardiovascular risk factors such as blood pressure, lipids.  Patient presents today for 1.5 year follow-up visit.  Since last office visit patient denies any anginal chest pain or heart failure symptoms.  No hospitalizations or urgent care visits for cardiovascular reasons.  She has been compliant with her medical therapy.  Has lost 8# since last office visit. No structured exercise program or daily routine. Home SBP usually <130 mmHg, when checked.   Review of Systems: .   Review of Systems  Cardiovascular:  Negative for chest pain, claudication, irregular heartbeat, leg swelling, near-syncope, orthopnea,  palpitations, paroxysmal nocturnal dyspnea and syncope.  Respiratory:  Negative for shortness of breath.   Hematologic/Lymphatic: Negative for bleeding problem.    Studies Reviewed:   EKG: EKG Interpretation Date/Time:  Wednesday September 02 2023 09:18:06 EDT Ventricular Rate:  82 PR Interval:  184 QRS Duration:  76 QT Interval:  336 QTC Calculation: 392 R Axis:   54  Text Interpretation: Normal sinus rhythm Low voltage QRS When compared with ECG of 04-Dec-2021 22:33, No significant change was found Confirmed by Large Madonna [phone]) on 09/02/2023 9:20:39 AM  Echocardiogram: 10/02/2020: Normal LV systolic function with visual EF 60-65%. Left ventricle cavity is normal in size. Normal left ventricular wall thickness. Normal global wall motion. Normal diastolic filling pattern, normal LAP. Mild (Grade I) mitral regurgitation. Mild tricuspid regurgitation. No evidence of pulmonary hypertension. Compared to study 10/18/2019: Moderate MR is now mild otherwise no significant change.   Stress Testing: Exercise Sestamibi stress test 12/05/2019: Normal myocardial perfusion.  Low risk study.    CT Cardiac Scoring: 10/03/2016: Coronary calcium  score of 15. This was 100 percentile for age and sex matched control.  RADIOLOGY: NA  Risk Assessment/Calculations:   NA   Labs:       Latest Ref Rng & Units 07/22/2023   10:40 AM 12/04/2021   10:55 PM 02/28/2021   11:41 AM  CBC  WBC 3.4 - 10.8 x10E3/uL 6.9  9.4  6.9   Hemoglobin 11.1 - 15.9 g/dL 85.7  86.1  86.4   Hematocrit 34.0 - 46.6 % 43.1  42.5  41.5   Platelets 150 - 450 x10E3/uL 383  340  391        Latest Ref Rng & Units  07/22/2023   10:40 AM 12/04/2021   10:55 PM 03/14/2021    1:30 PM  BMP  Glucose 70 - 99 mg/dL 91  893  96   BUN 6 - 24 mg/dL 12  11    Creatinine 9.42 - 1.00 mg/dL 9.15  9.21    BUN/Creat Ratio 9 - 23 14     Sodium 134 - 144 mmol/L 140  140    Potassium 3.5 - 5.2 mmol/L 4.3  3.4    Chloride 96 - 106 mmol/L 102   105    CO2 20 - 29 mmol/L 21  27    Calcium  8.7 - 10.2 mg/dL 9.9  9.3        Latest Ref Rng & Units 07/22/2023   10:40 AM 12/04/2021   10:55 PM 03/14/2021    1:30 PM  CMP  Glucose 70 - 99 mg/dL 91  893  96   BUN 6 - 24 mg/dL 12  11    Creatinine 9.42 - 1.00 mg/dL 9.15  9.21    Sodium 865 - 144 mmol/L 140  140    Potassium 3.5 - 5.2 mmol/L 4.3  3.4    Chloride 96 - 106 mmol/L 102  105    CO2 20 - 29 mmol/L 21  27    Calcium  8.7 - 10.2 mg/dL 9.9  9.3    Total Protein 6.0 - 8.5 g/dL 7.7  7.8    Total Bilirubin 0.0 - 1.2 mg/dL 0.6  0.8    Alkaline Phos 44 - 121 IU/L 108  66    AST 0 - 40 IU/L 15  17    ALT 0 - 32 IU/L 14  21      Lab Results  Component Value Date   CHOL 279 (H) 07/22/2023   HDL 52 07/22/2023   LDLCALC 217 (H) 07/22/2023   TRIG 65 07/22/2023   CHOLHDL 5.4 (H) 07/22/2023   No results for input(s): LIPOA in the last 8760 hours. No components found for: NTPROBNP No results for input(s): PROBNP in the last 8760 hours. Recent Labs    07/22/23 1040  TSH 1.040    Physical Exam:    Today's Vitals   09/02/23 0909  BP: 136/85  Pulse: 81  Resp: 16  SpO2: 94%  Weight: 157 lb (71.2 kg)  Height: 5' 4 (1.626 m)   Body mass index is 26.95 kg/m. Wt Readings from Last 3 Encounters:  09/02/23 157 lb (71.2 kg)  07/22/23 158 lb 9.6 oz (71.9 kg)  05/27/23 157 lb 6.4 oz (71.4 kg)    Physical Exam  Constitutional: No distress.  hemodynamically stable  Neck: No JVD present.  Cardiovascular: Normal rate, regular rhythm, S1 normal and S2 normal. Exam reveals no gallop, no S3 and no S4.  No murmur heard. Pulmonary/Chest: Effort normal and breath sounds normal. No stridor. She has no wheezes. She has no rales.  Musculoskeletal:        General: No edema.     Cervical back: Neck supple.  Skin: Skin is warm.   Impression & Recommendation(s):  Impression:   ICD-10-CM   1. Coronary atherosclerosis due to calcified coronary lesion of native artery  I25.10 EKG  12-Lead   I25.84 CT CARDIAC SCORING (SELF PAY ONLY)    2. Family history of premature CAD  Z82.49     3. Essential hypertension  I10     4. Pure hypercholesterolemia  E78.00     5. Nonrheumatic mitral valve regurgitation  I34.0 ECHOCARDIOGRAM COMPLETE       Recommendation(s):  Coronary atherosclerosis due to calcified coronary lesion of native artery Family history of premature CAD Denies anginal chest pain or heart failure symptoms. EKG is nonischemic. Patient had mild CAC at the age of 22 placing her at the 94th percentile. She has a strong family history of premature CAD as well as findings to suggest familial hypercholesterolemia. Shared decision was to proceed with another coronary calcium  score for further risk stratification. Remains on antiplatelets and lipid-lowering agents. Reemphasize importance of improving her modifiable cardiovascular risk factors  Essential hypertension Office blood pressures are acceptable. Home blood pressures are better controlled. Currently on amlodipine  10 mg p.o. daily  Pure hypercholesterolemia Fasting lipids 07/22/2023 independently reviewed.  Patient states that she was off of her meds when these labs were drawn. I have asked her to have the labs to be done with PCP. Recommended goal LDL at least less than 70 mg/dL She needs to be optimized on the maximally tolerated dose of statin and if LDL still not at goal consider PCSK9 inhibitors. Will recheck a coronary calcium  score for further risk stratification.  Nonrheumatic mitral valve regurgitation Noted to have moderate MR in the past with better blood pressure management follow-up study has noted mild MR. Will repeat a 4-year follow-up study in June 2026 Monitor for now  Medical decision making: Discussed management of these 2 chronic comorbid conditions. Outside labs from 07/22/2023 independently reviewed. Reviewed echo cardiogram August 2022. Prescription drug management. Ordered  coronary CTA as well as follow-up echocardiogram Reemphasize importance of continuing to follow-up with PCP for management of other comorbid conditions.  Orders Placed:  Orders Placed This Encounter  Procedures   CT CARDIAC SCORING (SELF PAY ONLY)    Standing Status:   Future    Expected Date:   09/09/2023    Expiration Date:   09/01/2024    [facility]    Is patient pregnant?:   No   EKG 12-Lead   ECHOCARDIOGRAM COMPLETE    Standing Status:   Future    Expected Date:   07/25/2024    Where should this test be performed[facility]    Does the patient weigh less than or greater than 250 lbs?:   Patient weighs less than 250 lbs    Perflutren DEFINITY (image enhancing agent) should be administered unless hypersensitivity or allergy exist:   Administer Perflutren    Reason for exam-Echo:   Other-Full Diagnosis List    Full ICD-10/Reason for Exam:   Mitral valve regurgitation [201049]     Final Medication List:   No orders of the defined types were placed in this encounter.   Medications Discontinued During This Encounter  Medication Reason   naproxen  (NAPROSYN ) 125 MG/5ML suspension Prescription never filled   ezetimibe  (ZETIA ) 10 MG tablet Prescription never filled   estradiol  (ESTRACE  VAGINAL) 0.1 MG/GM vaginal cream Patient Preference     Current Outpatient Medications:    amLODipine  (NORVASC ) 10 MG tablet, Take 1 tablet (10 mg total) by mouth daily., Disp: 30 tablet, Rfl: 1   aspirin  ([employer] ASPIRIN  EC) 325 MG EC tablet, Take 1 tablet (325 mg total) by mouth daily., Disp: 90 tablet, Rfl: 3   betamethasone , augmented, (DIPROLENE ) 0.05 % lotion, Apply topically 2 (two) times daily., Disp: 60 mL, Rfl: 1   CRESTOR  20 MG tablet, Take 1 tablet (20 mg total) by mouth daily., Disp: 90 tablet,  Rfl: 2   polyethylene glycol powder (GLYCOLAX /MIRALAX ) 17 GM/SCOOP powder, Take 17 g by mouth 2 (two) times daily as needed., Disp: 3350 g, Rfl:  0   Vitamin D , Ergocalciferol , (DRISDOL ) 1.25 MG (50000 UNIT) CAPS capsule, Take 1 capsule (50,000 Units total) by mouth every 7 (seven) days., Disp: 12 capsule, Rfl: 3  Consent:   NA  Disposition:   1 year follow-up, July 2026  Her questions and concerns were addressed to her satisfaction. She voices understanding of the recommendations provided during this encounter.    Signed, Madonna Michele HAS, [facility]  A Division of [facility] [address] [place], [place] [address]  [place], [place] [address] 09/02/2023 1:13 PM

## 2023-09-02 NOTE — Patient Instructions (Addendum)
 Medication Instructions:  None *If you need a refill on your cardiac medications before your next appointment, please call your pharmacy*  Lab Work: None If you have labs (blood work) drawn today and your tests are completely normal, you will receive your results only by: MyChart Message (if you have MyChart) OR A paper copy in the mail If you have any lab test that is abnormal or we need to change your treatment, we will call you to review the results.  Testing/Procedures: Your physician has requested that you have an echocardiogram. Echocardiography is a painless test that uses sound waves to create images of your heart. It provides your doctor with information about the size and shape of your heart and how well your heart's chambers and valves are working. This procedure takes approximately one hour. There are no restrictions for this procedure. Please do NOT wear cologne, perfume, aftershave, or lotions (deodorant is allowed). Please arrive 15 minutes prior to your appointment time.  Please note: We ask at that you not bring children with you during ultrasound (echo/ vascular) testing. Due to room size and safety concerns, children are not allowed in the ultrasound rooms during exams. Our front office staff cannot provide observation of children in our lobby area while testing is being conducted. An adult accompanying a patient to their appointment will only be allowed in the ultrasound room at the discretion of the ultrasound technician under special circumstances. We apologize for any inconvenience.   Your physician has requested that you have a coronary calcium  score performed. This is not covered by insurance and will be an out-of-pocket cost of approximately $99.   Follow-Up: At [facility], you and your health needs are our priority.  As part of our continuing mission to provide you with exceptional heart care, our providers are all part of one team.  This team includes your  primary Cardiologist (physician) and Advanced Practice Providers or APPs (Physician Assistants and Nurse Practitioners) who all work together to provide you with the care you need, when you need it.  Your next appointment:   1 year(s) follow up with Dr. Michele   Provider:   Madonna Michele, DO    Other: Please follow up with your primary care provider regarding your cholesterol.

## 2023-09-15 ENCOUNTER — Ambulatory Visit (HOSPITAL_COMMUNITY)
Admission: RE | Admit: 2023-09-15 | Discharge: 2023-09-15 | Disposition: A | Discharge status: Home / Self Care | Payer: Self-pay | Source: Ambulatory Visit | Attending: Cardiology | Admitting: Cardiology

## 2023-09-15 DIAGNOSIS — I2584 Coronary atherosclerosis due to calcified coronary lesion: Secondary | ICD-10-CM | POA: Insufficient documentation

## 2023-09-15 DIAGNOSIS — I2510 Atherosclerotic heart disease of native coronary artery without angina pectoris: Secondary | ICD-10-CM | POA: Insufficient documentation

## 2023-09-20 ENCOUNTER — Ambulatory Visit: Payer: Self-pay | Admitting: Cardiology

## 2023-09-27 ENCOUNTER — Encounter (HOSPITAL_BASED_OUTPATIENT_CLINIC_OR_DEPARTMENT_OTHER): Payer: Self-pay

## 2023-09-27 ENCOUNTER — Emergency Department (HOSPITAL_BASED_OUTPATIENT_CLINIC_OR_DEPARTMENT_OTHER)
Admission: EM | Admit: 2023-09-27 | Discharge: 2023-09-27 | Disposition: A | Discharge status: Home / Self Care | Attending: Emergency Medicine | Admitting: Emergency Medicine

## 2023-09-27 ENCOUNTER — Ambulatory Visit
Admission: EM | Admit: 2023-09-27 | Discharge: 2023-09-27 | Disposition: A | Discharge status: Home / Self Care | Attending: Physician Assistant | Admitting: Physician Assistant

## 2023-09-27 ENCOUNTER — Other Ambulatory Visit: Payer: Self-pay

## 2023-09-27 DIAGNOSIS — Z7982 Long term (current) use of aspirin: Secondary | ICD-10-CM | POA: Diagnosis not present

## 2023-09-27 DIAGNOSIS — Z7901 Long term (current) use of anticoagulants: Secondary | ICD-10-CM

## 2023-09-27 DIAGNOSIS — Z79899 Other long term (current) drug therapy: Secondary | ICD-10-CM | POA: Insufficient documentation

## 2023-09-27 DIAGNOSIS — H1132 Conjunctival hemorrhage, left eye: Secondary | ICD-10-CM | POA: Insufficient documentation

## 2023-09-27 DIAGNOSIS — H579 Unspecified disorder of eye and adnexa: Secondary | ICD-10-CM | POA: Diagnosis present

## 2023-09-27 MED ORDER — TETRACAINE HCL 0.5 % OP SOLN
2.0000 [drp] | Freq: Once | OPHTHALMIC | Status: AC
  Administered 2023-09-27: 2 [drp] via OPHTHALMIC
  Filled 2023-09-27: qty 4

## 2023-09-27 NOTE — ED Provider Notes (Signed)
 [facility]    CSN: [id number] Arrival date & time: 09/27/23  9170      History   Chief Complaint Chief Complaint  Patient presents with   Eye Problem    HPI Debbie [name] is a 53 y.o. female.   Patient here today for evaluation of subconjunctival hemorrhage to her left.  She reports that yesterday she felt some itching in the corner of her eye and she pulled on her lashes some and then today she noticed some blood in the corner of her eye.  She is currently on a anticoagulant.  She also has a history of aneurysms that are being monitored.  She notes that if she covers her right eye she has some blurring to her left eye and she sees floaters.  She reports she sees floaters at baseline however.  She reports some discomfort to her left eye.  The history is provided by the patient.  Eye Problem Associated symptoms: itching   Associated symptoms: no discharge, no nausea, no redness and no vomiting     Past Medical History:  Diagnosis Date   Anemia    history of   Aneurysm of carotid artery (HCC)    hx/o - coiling 02/07/2010   Chronic headache    since aneurysm diagnosis. mostly related to constipation as of 2017   Coronary artery calcification    Edema    Familial hypercholesteremia    LDL over 300; lipoprotein testing 01/2009   Family history of heart disease in female family member before age 54    LDL over 300 prior   H/O cardiovascular stress test 12/08/05   fair exercise capacity, no ST segment changes suggestive of ischemia   H/O echocardiogram 03/22/01   mild asymmetric LVH, mild aortic sclerosis, mild mitral regurgitation   History of blood transfusion    x2 due to heavy bleeding and anemia from fibroids   Hx of CT scan of chest 09/2016   Coronary calcium  score of 15. This was 4 percentile for age and sex   Hypertension    Prediabetes    S/P partial hysterectomy    due to uterine fibroids   Vertebral artery aneurysm (HCC)    left, unruptured, no  surgery correction, monitoring since 2011 (followed by Dr. Aubery)   Vitamin D  deficiency    Wears glasses     Patient Active Problem List   Diagnosis Date Noted   Essential hypertension 07/29/2023   Goiter 07/27/2023   Fatigue 07/22/2023   Daytime somnolence 07/22/2023   Pelvic pain 07/22/2023   Dyspareunia in female 07/22/2023   Atrophic vaginitis 07/22/2023   Dermatitis 07/22/2023   Screening mammogram for breast cancer 07/22/2023   Influenza vaccination declined 12/06/2021   Palpitations 12/06/2021   Anxiety 12/06/2021   Epigastric pain 08/21/2021   Plantar fasciitis 08/21/2021   Screening for cervical cancer 12/05/2020   Encounter for health maintenance examination in adult 12/05/2020   Encounter for screening mammogram for malignant neoplasm of breast 12/05/2020   S/P hysterectomy 12/05/2020   Acquired trigger finger 11/14/2020   Essential hypertension, benign 08/22/2019   Immunization reaction 08/22/2019   Aneurysm of carotid artery (HCC)    Bilateral hip pain 11/05/2018   Impaired fasting blood sugar 11/16/2015   Family history of diabetes mellitus 11/16/2015   Screen for colon cancer 04/17/2015   History of cerebral aneurysm repair 04/17/2015   Family history of aneurysm 04/17/2015   Family history of heart disease in female family member before  age 46 04/17/2015   Mixed dyslipidemia 04/17/2015   Familial hypercholesterolemia 04/17/2015   Vitamin D  deficiency 04/17/2015   Family history of breast cancer 04/17/2015   Constipation 04/17/2015    Past Surgical History:  Procedure Laterality Date   ABDOMINAL HYSTERECTOMY     ANEURYSM COILING  02/07/10   stent assisted coiling of right internal carotid artery aneurysm in periophthalmic region   CESAREAN SECTION     COLONOSCOPY  2011   Dr. Kristie, anemia workup   ESOPHAGOGASTRODUODENOSCOPY  2011   Dr. Kristie   HERNIA REPAIR     epigastric hernia repair age 74, right inguinal hernia as a child   PARTIAL  HYSTERECTOMY     abdominal due to fibroids, still has ovaries, Dr. Norine   TUBAL LIGATION     tubal reversal  2008    OB History   No obstetric history on file.      Home Medications    Prior to Admission medications   Medication Sig Start Date End Date Taking? Authorizing Provider  amLODipine  (NORVASC ) 10 MG tablet Take 1 tablet (10 mg total) by mouth daily. 07/24/23  Yes Tolia, Sunit, DO  amoxicillin  (AMOXIL ) 250 MG/5ML suspension Take 500 mg by mouth 3 (three) times daily. 09/09/23  Yes [provider]  aspirin  ([employer] ASPIRIN  EC) 325 MG EC tablet Take 1 tablet (325 mg total) by mouth daily. 03/15/21  Yes Tysinger, Alm RAMAN, PA-C  CRESTOR  20 MG tablet Take 1 tablet (20 mg total) by mouth daily. 07/22/23  Yes Tysinger, Alm RAMAN, PA-C  betamethasone , augmented, (DIPROLENE ) 0.05 % lotion Apply topically 2 (two) times daily. 07/22/23   Tysinger, Alm RAMAN, PA-C  polyethylene glycol powder (GLYCOLAX /MIRALAX ) 17 GM/SCOOP powder Take 17 g by mouth 2 (two) times daily as needed. 05/27/23   Tysinger, Alm RAMAN, PA-C  Vitamin D , Ergocalciferol , (DRISDOL ) 1.25 MG (50000 UNIT) CAPS capsule Take 1 capsule (50,000 Units total) by mouth every 7 (seven) days. 07/23/23   Tysinger, Alm RAMAN, PA-C    Family History Family History  Problem Relation Age of Onset   Breast cancer Mother        late 43s   Hypertension Mother    Obesity Mother    Cancer Mother        breast   Heart disease Father 92       hx/o CABG w/ redo, stening, died of MI at age 25yo   Diabetes Father    Hyperlipidemia Father    Heart failure Sister    Heart disease Sister 59       stents - LAD   Hypertension Sister    Aneurysm Sister    Cancer Maternal Grandmother        breast   Stroke Maternal Grandfather    Cancer Paternal Grandfather        prostate cancer   Heart failure Other    Cancer Other     Social History Social History   Tobacco Use   Smoking status: Never   Smokeless tobacco: Never  Vaping Use    Vaping status: Never Used  Substance Use Topics   Alcohol use: No   Drug use: No     Allergies   Covid-19 (mrna) vaccine, Covid-19 (adenovirus) vaccine, Atorvastatin  calcium , Contrast media [iodinated contrast media], Flexeril [cyclobenzaprine], Paxil [paroxetine hcl], and Sulfa antibiotics   Review of Systems Review of Systems  Constitutional:  Negative for chills and fever.  Eyes:  Positive for itching and visual disturbance. Negative for  discharge and redness.  Gastrointestinal:  Negative for abdominal pain, nausea and vomiting.     Physical Exam Triage Vital Signs ED Triage Vitals  Encounter Vitals Group     BP      Girls Systolic BP Percentile      Girls Diastolic BP Percentile      Boys Systolic BP Percentile      Boys Diastolic BP Percentile      Pulse      Resp      Temp      Temp src      SpO2      Weight      Height      Head Circumference      Peak Flow      Pain Score      Pain Loc      Pain Education      Exclude from Growth Chart    No data found.  Updated Vital Signs BP 126/73 (BP Location: Right Arm)   Pulse 91   Temp 98.6 F (37 C) (Oral)   Resp 18   Ht 5' 4.5 (1.638 m)   Wt 146 lb 12.8 oz (66.6 kg)   SpO2 98%   BMI 24.81 kg/m   Visual Acuity Right Eye Distance: (S) UTO (Unable to obtain) (Usually wears glasses for distance, not with patient.) Left Eye Distance: (S) UTO (Unable to obtain) (Usually wears glasses for distance, not with patient.) Bilateral Distance: (S) UTO (Unable to obtain) (Usually wears glasses for distance, not with patient.)  Right Eye Near:   Left Eye Near:    Bilateral Near:     Physical Exam Vitals and nursing note reviewed.  Constitutional:      General: She is not in acute distress.    Appearance: Normal appearance. She is not ill-appearing.  HENT:     Head: Normocephalic and atraumatic.  Eyes:     Extraocular Movements: Extraocular movements intact.     Pupils: Pupils are equal, round, and reactive  to light.     Comments: Hemorrhage noted to left medial subconjunctival area, right conjunctiva normal.  Cardiovascular:     Rate and Rhythm: Normal rate.  Pulmonary:     Effort: Pulmonary effort is normal. No respiratory distress.  Neurological:     Mental Status: She is alert.  Psychiatric:        Mood and Affect: Mood normal.        Behavior: Behavior normal.        Thought Content: Thought content normal.      UC Treatments / Results  Labs (all labs ordered are listed, but only abnormal results are displayed) Labs Reviewed - No data to display  EKG   Radiology No results found.  Procedures Procedures (including critical care time)  Medications Ordered in UC Medications - No data to display  Initial Impression / Assessment and Plan / UC Course  I have reviewed the triage vital signs and the nursing notes.  Pertinent labs & imaging results that were available during my care of the patient were reviewed by me and considered in my medical decision making (see chart for details).    Given history of aneurysm, anticoagulation and reported visual changes recommended further evaluation in the emergency room for tonometry and possible imaging.  Patient is agreeable with plan.  She will transport via POV.   Final Clinical Impressions(s) / UC Diagnoses   Final diagnoses:  Non-traumatic subconjunctival hemorrhage of left eye  Long  term current use of anticoagulant therapy   Discharge Instructions   None    ED Prescriptions   None    PDMP not reviewed this encounter.   Billy Asberry FALCON, PA-C 09/27/23 [phone]

## 2023-09-27 NOTE — ED Triage Notes (Signed)
 Pt presents from UC with complaints of redness and fullness in left eye. Pt reports that the irritation in the eye started yesterday and last night noticed the redness. Also endorses blurred vision.   Reports being on 325 mg ASA daily.

## 2023-09-27 NOTE — ED Triage Notes (Signed)
 Yesterday my left eye felt itchy in the corner, I pulled on my lashes some and then about 7am (today) I noticed a bump and now blood in the corner of the eye. I have a current history of aneurysm's so they are being monitored. If I cover my right eye the left eye is blurry and I see a few floaters.

## 2023-09-27 NOTE — ED Notes (Signed)
 Pt alert and oriented X 4 at the time of discharge. RR even and unlabored. No acute distress noted. Pt verbalized understanding of discharge instructions as discussed. Pt ambulatory to lobby at time of discharge.

## 2023-09-27 NOTE — ED Provider Notes (Signed)
 [place] [facility] Provider Note   CSN: [id number] Arrival date & time: 09/27/23  9082     Patient presents with: Eye Problem   Debbie [name] is a 53 y.o. female.    Eye Problem Patient presents with left eye problem.  States last night I was a little itchy and felt a little full.  Had a little bit of a subconjunctival hemorrhage.  States worse this morning.  Slight redness.  Seen in urgent care and sent here.  Is on aspirin  for previous aneurysm.  States may be a little more blurry than her baseline but does wear glasses.  States she does have bad vision normally.     Prior to Admission medications   Medication Sig Start Date End Date Taking? Authorizing Provider  amLODipine  (NORVASC ) 10 MG tablet Take 1 tablet (10 mg total) by mouth daily. 07/24/23   Tolia, Sunit, DO  amoxicillin  (AMOXIL ) 250 MG/5ML suspension Take 500 mg by mouth 3 (three) times daily. 09/09/23   [provider]  aspirin  ([employer] ASPIRIN  EC) 325 MG EC tablet Take 1 tablet (325 mg total) by mouth daily. 03/15/21   Tysinger, Alm GORMAN, PA-C  betamethasone , augmented, (DIPROLENE ) 0.05 % lotion Apply topically 2 (two) times daily. 07/22/23   Tysinger, Alm GORMAN, PA-C  CRESTOR  20 MG tablet Take 1 tablet (20 mg total) by mouth daily. 07/22/23   Tysinger, Alm GORMAN, PA-C  polyethylene glycol powder (GLYCOLAX /MIRALAX ) 17 GM/SCOOP powder Take 17 g by mouth 2 (two) times daily as needed. 05/27/23   Tysinger, Alm GORMAN, PA-C  Vitamin D , Ergocalciferol , (DRISDOL ) 1.25 MG (50000 UNIT) CAPS capsule Take 1 capsule (50,000 Units total) by mouth every 7 (seven) days. 07/23/23   Tysinger, Alm GORMAN, PA-C    Allergies: Covid-19 (mrna) vaccine, Covid-19 (adenovirus) vaccine, Atorvastatin  calcium , Contrast media [iodinated contrast media], Flexeril [cyclobenzaprine], Paxil [paroxetine hcl], and Sulfa antibiotics    Review of Systems  Updated Vital Signs BP 137/81 (BP Location: Right Arm)   Pulse 91    Temp 98.2 F (36.8 C) (Oral)   Resp 16   SpO2 100%   Physical Exam Vitals and nursing note reviewed.  Eyes:     Comments: Subconjunctival hemorrhage on medial aspect of left eye.  Eye movements intact.  Pupils reactive but somewhat constricted.  Neurological:     Mental Status: She is alert.     (all labs ordered are listed, but only abnormal results are displayed) Labs Reviewed - No data to display  EKG: None  Radiology: No results found.   Ultrasound ED Ocular  Date/Time: 09/27/2023 10:03 AM  Performed by: Patsey Lot, MD Authorized by: Patsey Lot, MD   PROCEDURE DETAILS:    Indications: eye pain     Assessed:  Left eye   Left eye axial view: obtained     Images: archived     Limitations:  None RIGHT EYE FINDINGS:     no vitreous hemorrhage in right eye    Medications Ordered in the ED  tetracaine  (PONTOCAINE) 0.5 % ophthalmic solution 2 drop (has no administration in time range)                                    Medical Decision Making Risk Prescription drug management.   Patient with subconjunctival hemorrhage on right.  Did have some mild pain.  States vision is a little more blurry.  Sent from urgent care.  Patient is worried because she has previous intracranial aneurysm.  However I doubt this is the cause.  Will check intraocular pressure and do ultrasound.  Unable to check intraocular pressure due to issues with the Tono-Pen, however doubt glaucoma as a cause.  Ultrasound does not show any hemorrhage.  Will have patient follow-up with ophthalmology.      Final diagnoses:  Non-traumatic subconjunctival hemorrhage, left    ED Discharge Orders     None          Patsey Lot, MD 09/27/23 1006

## 2023-09-27 NOTE — ED Notes (Signed)
 Patient is being discharged from the Urgent Care and sent to the Emergency Department via Private Vehicle (Self) . Per Provider, patient is in need of higher level of care due to Eye Problem (Left) with history of aneurysm. Patient is aware and verbalizes understanding of plan of care.  Vitals:   09/27/23 0842  BP: 126/73  Pulse: 91  Resp: 18  Temp: 98.6 F (37 C)  SpO2: 98%

## 2023-09-27 NOTE — ED Notes (Signed)
 Visual acuity completed per order. Patient wears glasses for driving and reading only. States that right eye is showing shadows around the letters on the chart.

## 2023-11-05 ENCOUNTER — Ambulatory Visit: Admitting: Medical

## 2023-11-05 VITALS — BP 110/74 | HR 79 | Temp 98.4°F | Wt 149.6 lb

## 2023-11-05 DIAGNOSIS — M549 Dorsalgia, unspecified: Secondary | ICD-10-CM

## 2023-11-05 DIAGNOSIS — E782 Mixed hyperlipidemia: Secondary | ICD-10-CM

## 2023-11-05 DIAGNOSIS — R5383 Other fatigue: Secondary | ICD-10-CM

## 2023-11-05 DIAGNOSIS — Z8616 Personal history of COVID-19: Secondary | ICD-10-CM

## 2023-11-05 DIAGNOSIS — I10 Essential (primary) hypertension: Secondary | ICD-10-CM

## 2023-11-05 MED ORDER — AMLODIPINE BESYLATE 5 MG PO TABS: 5.0000 mg | ORAL_TABLET | Freq: Every day | ORAL | 2 refills | Status: DC

## 2023-11-05 NOTE — Progress Notes (Signed)
 Name: Debbie [name]   Date of Visit: 11/05/23   Date of last visit with me: 07/22/2023   CHIEF COMPLAINT:  Chief Complaint  Patient presents with   Acute Visit    Cough and mid back pain, had covid in August and wants to make sure her lungs are clear. Still having some side effects of covid       HPI:  Discussed the use of AI scribe software for clinical note transcription with the patient, who gave verbal consent to proceed.  History of Present Illness   History of Present Illness Debbie [name] is a 53 year old female with hypertension who presents with persistent fatigue and back discomfort following a COVID-19 infection.  She tested positive for COVID-19 at the end of August. Initially, she experienced mild cold symptoms, including postnasal drip, but no sore throat. She developed a productive cough with yellow mucus, which resolved spontaneously within a few days, leaving a residual sporadic cough.  She reports discomfort in her back near the spine, described as uncomfortable.  She experiences significant fatigue, particularly in the mornings, feeling tired and unwell. She feels lethargic and almost nauseous when trying to eat in the morning, leading to minimal intake until lunchtime. She maintains a regular schedule with her water intake and lunch but sometimes experiences low energy in the afternoons.  She has a history of borderline diabetes and used to check her fasting blood sugar and blood pressure regularly. Since June, she has lost over twenty pounds. Her blood pressure was previously stable at around 117/70, and her fasting blood sugars were consistently around 90-91. Recently, she felt jittery in the mornings and checked her blood sugar, which was 84, and her blood pressure, which was 106/70, both lower than usual.  She is currently taking amlodipine  10 mg daily for blood pressure and Crestor  20 mg daily for cholesterol. She stopped checking her blood pressure  regularly before contracting COVID-19, as it tended to spike during illness. Her blood pressure readings have been lower since her weight loss, with occasional low readings such as 103/60.  ROS as in subjective   Past Medical History:  Diagnosis Date   Anemia    history of   Aneurysm of carotid artery (HCC)    hx/o - coiling 02/07/2010   Chronic headache    since aneurysm diagnosis. mostly related to constipation as of 2017   Coronary artery calcification    Edema    Familial hypercholesteremia    LDL over 300; lipoprotein testing 01/2009   Family history of heart disease in female family member before age 71    LDL over 300 prior   H/O cardiovascular stress test 12/08/05   fair exercise capacity, no ST segment changes suggestive of ischemia   H/O echocardiogram 03/22/01   mild asymmetric LVH, mild aortic sclerosis, mild mitral regurgitation   History of blood transfusion    x2 due to heavy bleeding and anemia from fibroids   Hx of CT scan of chest 09/2016   Coronary calcium  score of 15. 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 Aubery)   Vitamin D  deficiency    Wears glasses    Current Outpatient Medications on File Prior to Visit  Medication Sig Dispense Refill  aspirin  ([employer]S ASPIRIN  EC) 325 MG EC tablet Take 1 tablet (325 mg total) by mouth daily. 90 tablet 3   betamethasone , augmented, (DIPROLENE ) 0.05 % lotion Apply topically 2 (two) times daily. 60 mL 1   CRESTOR  20 MG tablet Take 1 tablet (20 mg total) by mouth daily. 90 tablet 2   polyethylene glycol powder (GLYCOLAX /MIRALAX ) 17 GM/SCOOP powder Take 17 g by mouth 2 (two) times daily as needed. 3350 g 0   Vitamin D , Ergocalciferol , (DRISDOL ) 1.25 MG (50000 UNIT) CAPS capsule Take 1 capsule (50,000 Units total) by mouth  every 7 (seven) days. 12 capsule 3   No current facility-administered medications on file prior to visit.     Objective BP 110/74   Pulse 79   Temp 98.4 F (36.9 C)   Wt 149 lb 9.6 oz (67.9 kg)   SpO2 100%   BMI 25.28 kg/m   General appearence: alert, no distress, WD/WN,  Neck: supple, no lymphadenopathy, no thyromegaly, no masses Heart: RRR, normal S1, S2, no murmurs Lungs: CTA bilaterally, no wheezes, rhonchi, or rales Abdomen: +bs, soft, non tender, non distended, no masses, no hepatomegaly, no splenomegaly Pulses: 2+ symmetric, upper and lower extremities, normal cap refill Ext: no edema    Assessment  Encounter Diagnoses  Name Primary?   Essential hypertension Yes   Other fatigue    Mixed dyslipidemia    History of COVID-19    Discomfort of back       Plan: COVID-19 infection, recent Recent COVID-19 infection with mild symptoms. Residual cough and fatigue noted. Post-viral fatigue common. - Fatigue should gradually resolve - Consider taking emergency Imodium plus vitamins for the next week -Consider 3 to 5 days of Safe Tussin DM over-the-counter  Fatigue and lethargy, post-viral and possibly medication-related Persistent fatigue possibly related to recent COVID-19 and low blood pressure due to weight loss and antihypertensive medication. - Monitor blood pressure regularly. - Reduce Amlodipine  to 5 mg daily if blood pressure consistently reads below 110/65.  Back discomfort, likely musculoskeletal, post-cough Back discomfort likely musculoskeletal, related to recent coughing. No cardiac involvement indicated.  Essential hypertension with recent weight loss and possible medication adjustment - Monitor blood pressure regularly. - Reduce Amlodipine  to 5 mg daily   Hyperlipidemia Hyperlipidemia managed with Crestor . - Continue Crestor  20 mg daily.   Debbie [name] was seen today for acute visit.  Diagnoses and all orders for this visit:  Essential  hypertension  Other fatigue  Mixed dyslipidemia  History of COVID-19  Discomfort of back  Other orders -     amLODipine  (NORVASC ) 5 MG tablet; Take 1 tablet (5 mg total) by mouth daily.   F/u prn

## 2023-11-06 ENCOUNTER — Telehealth: Payer: Self-pay

## 2023-11-06 NOTE — Telephone Encounter (Signed)
 We reviewed your office note from her visit and did not see anywhere noted that you had recommendation an anti biotic or z-pak.   Copied from CRM [id number]. Topic: Clinical - Medication Question >> Nov 06, 2023  3:53 PM Debbie [name] wrote: Reason for CRM: Patient is calling in because she saw Dr. Bulah yesterday and he offered her an antibiotic and she didn't want it at first, but now she wants it. Patient says she prefers a Zpack but if not liquid amoxicillin  because the pills are too big to swallow.

## 2023-11-07 ENCOUNTER — Ambulatory Visit: Admission: EM | Admit: 2023-11-07 | Discharge: 2023-11-07 | Disposition: A | Discharge status: Home / Self Care

## 2023-11-07 ENCOUNTER — Ambulatory Visit (INDEPENDENT_AMBULATORY_CARE_PROVIDER_SITE_OTHER)

## 2023-11-07 DIAGNOSIS — R051 Acute cough: Secondary | ICD-10-CM | POA: Diagnosis not present

## 2023-11-07 DIAGNOSIS — G9331 Postviral fatigue syndrome: Secondary | ICD-10-CM

## 2023-11-07 LAB — POC COVID19/FLU A&B COMBO
Covid Antigen, POC: NEGATIVE
Influenza A Antigen, POC: NEGATIVE
Influenza B Antigen, POC: NEGATIVE

## 2023-11-07 NOTE — ED Triage Notes (Signed)
 Patient reports having COVID19 back in Aug. 2025, not well since then and it seems to be the Cough has returned, this week noticed a change in BP readings, recurrent back pain. Seen by my PCP recently who said their may be an infection in my lungs, was told once called back I would be treated for possible Pna (but no Rx sent by them). The drops in my BP's are concerning me due to drops in BP. I just want to make sure what this is and if it is a bi-product of COVID19.

## 2023-11-07 NOTE — ED Provider Notes (Signed)
 [facility]  Note:  This document was prepared using Conservation officer, historic buildings and may include unintentional dictation errors.  MRN: [id number] DOB: 19-May-1970  Subjective:   RUBIE FICCO is a 53 y.o. female presenting for continued fatigue, intermittent cough, low blood pressure after being diagnosed with COVID-19 in August.  Patient reports that cough did go away previously but has returned over the last week.  Patient denies any chest pain, shortness of breath, weakness, dizziness.  Patient is concerned for blood pressure because it has been on the lower end of normal with home blood pressure checks.  Patient denies any syncopal episodes or feelings of severe dizziness.  Patient saw her primary care provider who stated that she had abnormal lung sounds but did not order a chest x-ray to evaluate symptoms.  Patient was concern for possible pneumonia which prompted visit to urgent care.  No current facility-administered medications for this encounter.  Current Outpatient Medications:    amLODipine  (NORVASC ) 10 MG tablet, Take 2.5 mg by mouth 4 (four) times daily. Breaking it into 4 pieces (2.5mg ) and taking it over the course of the day., Disp: , Rfl:    aspirin  ([employer] ASPIRIN  EC) 325 MG EC tablet, Take 1 tablet (325 mg total) by mouth daily., Disp: 90 tablet, Rfl: 3   aspirin  81 MG chewable tablet, Chew 324 mg by mouth daily. Daily., Disp: , Rfl:    betamethasone , augmented, (DIPROLENE ) 0.05 % lotion, Apply topically 2 (two) times daily., Disp: 60 mL, Rfl: 1   CRESTOR  20 MG tablet, Take 1 tablet (20 mg total) by mouth daily., Disp: 90 tablet, Rfl: 2   polyethylene glycol powder (GLYCOLAX /MIRALAX ) 17 GM/SCOOP powder, Take 17 g by mouth 2 (two) times daily as needed., Disp: 3350 g, Rfl: 0   amLODipine  (NORVASC ) 5 MG tablet, Take 1 tablet (5 mg total) by mouth daily., Disp: 30 tablet, Rfl: 2   Vitamin D , Ergocalciferol , (DRISDOL ) 1.25 MG (50000 UNIT) CAPS capsule, Take 1 capsule  (50,000 Units total) by mouth every 7 (seven) days., Disp: 12 capsule, Rfl: 3   Allergies  Allergen Reactions   Covid-19 (Mrna) Vaccine Anaphylaxis   Covid-19 (Adenovirus) Vaccine Other (See Comments)    High BP, pulses elevated, mild tongue swelling, mild dyspnea   Atorvastatin  Calcium      Other Reaction(s): Not available   Contrast Media [Iodinated Contrast Media]     For CT Angio, breaks out in hives   Flexeril [Cyclobenzaprine]    Paxil [Paroxetine Hcl]     Inorgasmic, tic/spasm of tongue   Penicillins Other (See Comments)    Unknown reaction many yrs ago  Other Reaction(s): Unknown   Sulfa Antibiotics Other (See Comments)    Unknown reaction  Other Reaction(s): Unknown    Past Medical History:  Diagnosis Date   Anemia    history of   Aneurysm of carotid artery (HCC)    hx/o - coiling 02/07/2010   Chronic headache    since aneurysm diagnosis. mostly related to constipation as of 2017   Coronary artery calcification    Edema    Familial hypercholesteremia    LDL over 300; lipoprotein testing 01/2009   Family history of heart disease in female family member before age 47    LDL over 300 prior   H/O cardiovascular stress test 12/08/05   fair exercise capacity, no ST segment changes suggestive of ischemia   H/O echocardiogram 03/22/01   mild asymmetric LVH, mild aortic sclerosis, mild mitral regurgitation  History of blood transfusion    x2 due to heavy bleeding and anemia from fibroids   Hx of CT scan of chest 09/2016   Coronary calcium  score of 15. This was 54 percentile for age and sex   Hypertension    Prediabetes    S/P partial hysterectomy    due to uterine fibroids   Vertebral artery aneurysm (HCC)    left, unruptured, no surgery correction, monitoring since 2011 (followed by Dr. Aubery)   Vitamin D  deficiency    Wears glasses      Past Surgical History:  Procedure Laterality Date   ABDOMINAL HYSTERECTOMY     ANEURYSM COILING  02/07/10   stent  assisted coiling of right internal carotid artery aneurysm in periophthalmic region   CESAREAN SECTION     COLONOSCOPY  2011   Dr. Kristie, anemia workup   ESOPHAGOGASTRODUODENOSCOPY  2011   Dr. Kristie   HERNIA REPAIR     epigastric hernia repair age 55, right inguinal hernia as a child   PARTIAL HYSTERECTOMY     abdominal due to fibroids, still has ovaries, Dr. Norine   TUBAL LIGATION     tubal reversal  2008    Family History  Problem Relation Age of Onset   Breast cancer Mother        late 3s   Hypertension Mother    Obesity Mother    Cancer Mother        breast   Heart disease Father 51       hx/o CABG w/ redo, stening, died of MI at age 90yo   Diabetes Father    Hyperlipidemia Father    Heart failure Sister    Heart disease Sister 84       stents - LAD   Hypertension Sister    Aneurysm Sister    Cancer Maternal Grandmother        breast   Stroke Maternal Grandfather    Cancer Paternal Grandfather        prostate cancer   Heart failure Other    Cancer Other     Social History   Tobacco Use   Smoking status: Never   Smokeless tobacco: Never  Vaping Use   Vaping status: Never Used  Substance Use Topics   Alcohol use: No   Drug use: No    ROS Refer to HPI for ROS details.  Objective:   Vitals: BP 138/82 (BP Location: Right Arm)   Pulse 77   Temp 98.3 F (36.8 C) (Oral)   Resp 18   Ht 5' 4.5 (1.638 m)   Wt 149 lb 11.1 oz (67.9 kg)   SpO2 97%   BMI 25.30 kg/m   Physical Exam Vitals and nursing note reviewed.  Constitutional:      General: She is not in acute distress.    Appearance: Normal appearance. She is well-developed. She is not ill-appearing or toxic-appearing.  HENT:     Head: Normocephalic and atraumatic.     Nose: Nose normal.     Mouth/Throat:     Mouth: Mucous membranes are moist.  Cardiovascular:     Rate and Rhythm: Normal rate.  Pulmonary:     Effort: Pulmonary effort is normal. No respiratory distress.     Breath sounds:  No stridor. No wheezing.  Chest:     Chest wall: No tenderness.  Skin:    General: Skin is warm and dry.  Neurological:     General: No focal deficit present.  Mental Status: She is alert and oriented to person, place, and time.  Psychiatric:        Mood and Affect: Mood normal.        Behavior: Behavior normal.     Procedures  Results for orders placed or performed during the hospital encounter of 11/07/23 (from the past 24 hours)  POC Covid19/Flu A&B Antigen     Status: Normal   Collection Time: 11/07/23 10:11 AM  Result Value Ref Range   Influenza A Antigen, POC Negative Negative   Influenza B Antigen, POC Negative Negative   Covid Antigen, POC Negative Negative    DG Chest 2 View Result Date: 11/07/2023 EXAM: 2 VIEW(S) XRAY OF THE CHEST 11/07/2023 09:53:49 AM COMPARISON: 12/04/2021 CLINICAL HISTORY: Cough. Patient reports having COVID19 back in Aug. 2025, not well since then and it seems to be the Cough has returned, this week noticed a change in BP readings, recurrent back pain. Seen by my PCP recently who said their may be an infection in my lungs, was told once called back I would be treated for possible Pna (but no Rx sent by them). The drops in my BP's are concerning me due to drops in BP. I just want to make sure what this is and if it is a bi-product of COVID19. FINDINGS: LUNGS AND PLEURA: No focal pulmonary opacity. No pulmonary edema. No pleural effusion. No pneumothorax. HEART AND MEDIASTINUM: No acute abnormality of the cardiac and mediastinal silhouettes. BONES AND SOFT TISSUES: No acute osseous abnormality. IMPRESSION: 1. No acute process. Electronically signed by: Waddell Calk MD 11/07/2023 10:16 AM EDT RP Workstation: [id number]     Assessment and Plan :     Discharge Instructions       1. Postviral fatigue syndrome (Primary) - DG Chest 2 View x-ray completed in UC shows no acute cardiopulmonary processes, no sign of consolidation or pneumonia. - POC  Covid19/Flu A&B Antigen complete in UC is negative for COVID and influenza - Fatigue and postviral syndrome may last up to 6 to 8 weeks following viral illness especially COVID. - Take vitamin C and zinc, increase fluid intake, eat nutritious diet and increase protein intake to recover after viral illness. -Continue to monitor symptoms for any change in severity if there is any escalation of current symptoms or development of new symptoms follow-up in ER for further evaluation and management.       Leigh Kaeding B Laylee Schooley   Naphtali Zywicki, [place], [place] 11/07/23 1048

## 2023-11-07 NOTE — Discharge Instructions (Signed)
  1. Postviral fatigue syndrome (Primary) - DG Chest 2 View x-ray completed in UC shows no acute cardiopulmonary processes, no sign of consolidation or pneumonia. - POC Covid19/Flu A&B Antigen complete in UC is negative for COVID and influenza - Fatigue and postviral syndrome may last up to 6 to 8 weeks following viral illness especially COVID. - Take vitamin C and zinc, increase fluid intake, eat nutritious diet and increase protein intake to recover after viral illness. -Continue to monitor symptoms for any change in severity if there is any escalation of current symptoms or development of new symptoms follow-up in ER for further evaluation and management.

## 2023-11-08 ENCOUNTER — Other Ambulatory Visit: Payer: Self-pay | Admitting: Medical

## 2023-11-08 MED ORDER — AZITHROMYCIN 250 MG PO TABS: ORAL_TABLET | ORAL | 0 refills | Status: AC

## 2024-01-06 ENCOUNTER — Ambulatory Visit: Payer: Self-pay

## 2024-01-06 ENCOUNTER — Ambulatory Visit: Admitting: Family Medicine

## 2024-01-06 VITALS — BP 126/76 | HR 83 | Wt 149.8 lb

## 2024-01-06 DIAGNOSIS — K5900 Constipation, unspecified: Secondary | ICD-10-CM

## 2024-01-06 DIAGNOSIS — E049 Nontoxic goiter, unspecified: Secondary | ICD-10-CM

## 2024-01-06 NOTE — Progress Notes (Signed)
   Name: Debbie [name]   Date of Visit: 01/06/24   Date of last visit with me: Visit date not found   CHIEF COMPLAINT:  Chief Complaint  Patient presents with   Acute Visit    Tender throat, swollen lump on neck, pressure when swallowing.        HPI:  Discussed the use of AI scribe software for clinical note transcription with the patient, who gave verbal consent to proceed.  History of Present Illness   Debbie [name] is a 53 year old female who presents with difficulty swallowing and throat discomfort.  She experiences a sensation of pressure in her throat, described as 'somebody's got her thumb pressing into my throat,' which persists even when not swallowing and has been ongoing for several months. Swallowing feels like mucus or saliva is getting stuck, and she typically sips on water throughout the day to alleviate this feeling. Warm drinks sometimes provide soothing relief.  The discomfort is not sore inside her throat but is tender when she turns her head. This morning, she experienced pain for the first time when swallowing. A previous ultrasound of her thyroid  showed slight enlargement with some irregularities in the tissues. She recalls being told there were no nodules, and that her lab work was normal.  She also reports palpitations and constipation. She consumes at least six 8-ounce cups of water daily and tries to maintain a healthy diet. She has been prescribed Miralax  in the past but has not used it due to concerns about dependency. No tickle in the throat and no issues swallowing food, although her throat feels more aggravated after eating breakfast.         OBJECTIVE:       07/22/2023    9:46 AM  Depression screen PHQ 2/9  Decreased Interest 0  Down, Depressed, Hopeless 0  PHQ - 2 Score 0     BP Readings from Last 3 Encounters:  01/06/24 126/76  11/07/23 138/82  11/05/23 110/74    BP 126/76   Pulse 83   Wt 149 lb 12.8 oz (67.9 kg)   SpO2 98%   BMI  25.32 kg/m    Physical Exam          Physical Exam Constitutional:      Appearance: Normal appearance.  Neurological:     General: No focal deficit present.     Mental Status: She is alert and oriented to person, place, and time. Mental status is at baseline.     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 Dysphagia present. Normal thyroid  function. ENT evaluation recommended for potential partial thyroidectomy. - Referred to ENT for evaluation of compressive symptoms and potential need for partial thyroidectomy. - Advised use of acetaminophen  or ibuprofen  for pain management if needed.  Constipation Chronic constipation due to inadequate fiber and water intake. Previous Miralax  recommendation not followed. - Increase water intake to 60-80 ounces daily. - Take Miralax  once daily for three days. - Continue daily Metamucil for fiber intake.         Earle Burson A. Vita MD [facility]

## 2024-01-06 NOTE — Telephone Encounter (Signed)
 FYI Only or Action Required?: FYI only for provider: appointment scheduled on today.  Patient was last seen in primary care on 11/05/2023 by Bulah Alm RAMAN, PA-C.  Called Nurse Triage reporting Sore Throat.  Symptoms began today.  Interventions attempted: Nothing.  Symptoms are: unchanged.  Triage Disposition: See Physician Within 24 Hours  Patient/caregiver understands and will follow disposition?: Yes     Copied from CRM #[id number]. Topic: Clinical - Red Word Triage >> Jan 06, 2024 10:18 AM Treva T wrote: [facility] that prompted transfer to Nurse Triage: Patient calling states she is having thyroid  tenderness, and difficulty swallowing, and reports tenderness when she turns her head from side to side, and feels like a lump is in her throat.   Patient requesting an appointment for evaluation.     Reason for Disposition  SEVERE throat pain (e.g., excruciating)    Pain is not severe but patient reports some difficulty swallowing, appointment scheduled for today  Answer Assessment - Initial Assessment Questions 1. ONSET: When did the throat start hurting? (Hours or days ago)      Today 2. SEVERITY: How bad is the sore throat? (Scale 1-10; mild, moderate or severe)     Mild  3. STREP EXPOSURE: Has there been any exposure to strep within the past week? If Yes, ask: What type of contact occurred?      No 4.  VIRAL SYMPTOMS: Are there any symptoms of a cold, such as a runny nose, cough, hoarse voice or red eyes?      No 5. FEVER: Do you have a fever? If Yes, ask: What is your temperature, how was it measured, and when did it start?     No 6. PUS ON THE TONSILS: Is there pus on the tonsils in the back of your throat?     No 7. OTHER SYMPTOMS: Do you have any other symptoms? (e.g., difficulty breathing, headache, rash)     Some difficulty swallowing like there's a lump in my throat  Protocols used: Sore Throat-A-AH

## 2024-01-20 ENCOUNTER — Encounter (INDEPENDENT_AMBULATORY_CARE_PROVIDER_SITE_OTHER): Payer: Self-pay

## 2024-02-19 ENCOUNTER — Other Ambulatory Visit: Payer: Self-pay | Admitting: Medical

## 2024-08-02 ENCOUNTER — Other Ambulatory Visit (HOSPITAL_COMMUNITY)
